# Patient Record
Sex: Male | Born: 1960
Health system: Southern US, Community
[De-identification: ages and names within clinical notes are randomized; demographics above are authoritative.]

## PROBLEM LIST (undated history)

## (undated) DIAGNOSIS — E785 Hyperlipidemia, unspecified: Secondary | ICD-10-CM

## (undated) DIAGNOSIS — R310 Gross hematuria: Secondary | ICD-10-CM

## (undated) DIAGNOSIS — Z85828 Personal history of other malignant neoplasm of skin: Secondary | ICD-10-CM

## (undated) DIAGNOSIS — F419 Anxiety disorder, unspecified: Secondary | ICD-10-CM

## (undated) DIAGNOSIS — F329 Major depressive disorder, single episode, unspecified: Secondary | ICD-10-CM

## (undated) DIAGNOSIS — T7840XA Allergy, unspecified, initial encounter: Secondary | ICD-10-CM

## (undated) DIAGNOSIS — K219 Gastro-esophageal reflux disease without esophagitis: Secondary | ICD-10-CM

## (undated) DIAGNOSIS — K449 Diaphragmatic hernia without obstruction or gangrene: Secondary | ICD-10-CM

## (undated) DIAGNOSIS — Z87442 Personal history of urinary calculi: Secondary | ICD-10-CM

## (undated) DIAGNOSIS — M1990 Unspecified osteoarthritis, unspecified site: Secondary | ICD-10-CM

## (undated) DIAGNOSIS — Z973 Presence of spectacles and contact lenses: Secondary | ICD-10-CM

## (undated) DIAGNOSIS — R0789 Other chest pain: Secondary | ICD-10-CM

## (undated) DIAGNOSIS — N369 Urethral disorder, unspecified: Secondary | ICD-10-CM

## (undated) DIAGNOSIS — Z87898 Personal history of other specified conditions: Secondary | ICD-10-CM

## (undated) DIAGNOSIS — R351 Nocturia: Secondary | ICD-10-CM

## (undated) DIAGNOSIS — A6000 Herpesviral infection of urogenital system, unspecified: Secondary | ICD-10-CM

## (undated) DIAGNOSIS — I10 Essential (primary) hypertension: Secondary | ICD-10-CM

## (undated) DIAGNOSIS — G4733 Obstructive sleep apnea (adult) (pediatric): Secondary | ICD-10-CM

## (undated) DIAGNOSIS — J302 Other seasonal allergic rhinitis: Secondary | ICD-10-CM

## (undated) DIAGNOSIS — F32A Depression, unspecified: Secondary | ICD-10-CM

## (undated) HISTORY — DX: Major depressive disorder, single episode, unspecified: F32.9

## (undated) HISTORY — PX: VASECTOMY: SHX75

## (undated) HISTORY — PX: POSTERIOR LUMBAR FUSION: SHX6036

## (undated) HISTORY — DX: Other chest pain: R07.89

## (undated) HISTORY — DX: Obstructive sleep apnea (adult) (pediatric): G47.33

## (undated) HISTORY — PX: SPINE SURGERY: SHX786

## (undated) HISTORY — PX: BACK SURGERY: SHX140

## (undated) HISTORY — DX: Personal history of urinary calculi: Z87.442

## (undated) HISTORY — DX: Allergy, unspecified, initial encounter: T78.40XA

## (undated) HISTORY — PX: KNEE SURGERY: SHX244

## (undated) HISTORY — PX: COLONOSCOPY: SHX174

## (undated) HISTORY — DX: Depression, unspecified: F32.A

## (undated) HISTORY — PX: JOINT REPLACEMENT: SHX530

## (undated) HISTORY — DX: Hyperlipidemia, unspecified: E78.5

---

## 1997-06-20 ENCOUNTER — Emergency Department (HOSPITAL_COMMUNITY): Admission: EM | Admit: 1997-06-20 | Discharge: 1997-06-20 | Payer: Self-pay | Admitting: Emergency Medicine

## 1999-05-06 ENCOUNTER — Ambulatory Visit (HOSPITAL_BASED_OUTPATIENT_CLINIC_OR_DEPARTMENT_OTHER): Admission: RE | Admit: 1999-05-06 | Discharge: 1999-05-06 | Payer: Self-pay | Admitting: *Deleted

## 2000-06-10 ENCOUNTER — Ambulatory Visit (HOSPITAL_BASED_OUTPATIENT_CLINIC_OR_DEPARTMENT_OTHER): Admission: RE | Admit: 2000-06-10 | Discharge: 2000-06-10 | Payer: Self-pay | Admitting: *Deleted

## 2001-01-19 HISTORY — PX: ROTATOR CUFF REPAIR: SHX139

## 2005-01-19 HISTORY — PX: KNEE ARTHROSCOPY: SUR90

## 2005-01-19 HISTORY — PX: KNEE SURGERY: SHX244

## 2007-04-21 ENCOUNTER — Encounter: Payer: Self-pay | Admitting: Family Medicine

## 2007-11-21 ENCOUNTER — Encounter: Payer: Self-pay | Admitting: Family Medicine

## 2008-04-03 ENCOUNTER — Ambulatory Visit: Payer: Self-pay | Admitting: Family Medicine

## 2008-04-03 DIAGNOSIS — Z87442 Personal history of urinary calculi: Secondary | ICD-10-CM | POA: Insufficient documentation

## 2008-04-03 DIAGNOSIS — F329 Major depressive disorder, single episode, unspecified: Secondary | ICD-10-CM | POA: Insufficient documentation

## 2008-04-03 DIAGNOSIS — E785 Hyperlipidemia, unspecified: Secondary | ICD-10-CM | POA: Insufficient documentation

## 2008-04-03 DIAGNOSIS — G4700 Insomnia, unspecified: Secondary | ICD-10-CM | POA: Insufficient documentation

## 2008-04-03 DIAGNOSIS — J309 Allergic rhinitis, unspecified: Secondary | ICD-10-CM | POA: Insufficient documentation

## 2008-04-25 ENCOUNTER — Ambulatory Visit: Payer: Self-pay | Admitting: Family Medicine

## 2008-05-31 ENCOUNTER — Ambulatory Visit: Payer: Self-pay | Admitting: Family Medicine

## 2008-05-31 LAB — CONVERTED CEMR LAB
Glucose, Urine, Semiquant: NEGATIVE
Ketones, urine, test strip: NEGATIVE
Nitrite: NEGATIVE
Specific Gravity, Urine: 1.025
Urobilinogen, UA: 0.2
WBC Urine, dipstick: NEGATIVE
pH: 6.5

## 2008-06-01 LAB — CONVERTED CEMR LAB
ALT: 32 units/L (ref 0–53)
AST: 28 units/L (ref 0–37)
Albumin: 4.3 g/dL (ref 3.5–5.2)
Alkaline Phosphatase: 70 units/L (ref 39–117)
BUN: 14 mg/dL (ref 6–23)
Basophils Absolute: 0 10*3/uL (ref 0.0–0.1)
Basophils Relative: 0.5 % (ref 0.0–3.0)
Bilirubin, Direct: 0.1 mg/dL (ref 0.0–0.3)
CO2: 32 meq/L (ref 19–32)
Calcium: 9.5 mg/dL (ref 8.4–10.5)
Chloride: 106 meq/L (ref 96–112)
Cholesterol: 221 mg/dL — ABNORMAL HIGH (ref 0–200)
Creatinine, Ser: 0.9 mg/dL (ref 0.4–1.5)
Direct LDL: 158.7 mg/dL
Eosinophils Absolute: 0.1 10*3/uL (ref 0.0–0.7)
Eosinophils Relative: 2 % (ref 0.0–5.0)
GFR calc non Af Amer: 96.43 mL/min (ref >60)
Glucose, Bld: 99 mg/dL (ref 70–99)
HCT: 43.6 % (ref 39.0–52.0)
HDL: 46.3 mg/dL (ref >39.00)
Hemoglobin: 15.1 g/dL (ref 13.0–17.0)
Lymphocytes Relative: 31.1 % (ref 12.0–46.0)
Lymphs Abs: 1.4 10*3/uL (ref 0.7–4.0)
MCHC: 34.7 g/dL (ref 30.0–36.0)
MCV: 90.7 fL (ref 78.0–100.0)
Monocytes Absolute: 0.5 10*3/uL (ref 0.1–1.0)
Monocytes Relative: 10.9 % (ref 3.0–12.0)
Neutro Abs: 2.5 10*3/uL (ref 1.4–7.7)
Neutrophils Relative %: 55.5 % (ref 43.0–77.0)
Platelets: 145 10*3/uL — ABNORMAL LOW (ref 150.0–400.0)
Potassium: 4.2 meq/L (ref 3.5–5.1)
RBC: 4.81 M/uL (ref 4.22–5.81)
RDW: 12.8 % (ref 11.5–14.6)
Sodium: 142 meq/L (ref 135–145)
TSH: 1.93 microintl units/mL (ref 0.35–5.50)
Total Bilirubin: 2.1 mg/dL — ABNORMAL HIGH (ref 0.3–1.2)
Total CHOL/HDL Ratio: 5
Total Protein: 7.2 g/dL (ref 6.0–8.3)
Triglycerides: 91 mg/dL (ref 0.0–149.0)
VLDL: 18.2 mg/dL (ref 0.0–40.0)
WBC: 4.5 10*3/uL (ref 4.5–10.5)

## 2008-06-07 ENCOUNTER — Ambulatory Visit: Payer: Self-pay | Admitting: Family Medicine

## 2008-06-07 DIAGNOSIS — F528 Other sexual dysfunction not due to a substance or known physiological condition: Secondary | ICD-10-CM | POA: Insufficient documentation

## 2008-06-26 ENCOUNTER — Ambulatory Visit: Payer: Self-pay | Admitting: Family Medicine

## 2008-07-31 ENCOUNTER — Ambulatory Visit: Payer: Self-pay | Admitting: Family Medicine

## 2008-08-02 ENCOUNTER — Encounter: Admission: RE | Admit: 2008-08-02 | Discharge: 2008-08-02 | Payer: Self-pay | Admitting: Family Medicine

## 2009-04-09 ENCOUNTER — Ambulatory Visit: Payer: Self-pay | Admitting: Family Medicine

## 2009-05-08 ENCOUNTER — Encounter: Payer: Self-pay | Admitting: Family Medicine

## 2009-06-03 ENCOUNTER — Ambulatory Visit: Payer: Self-pay | Admitting: Family Medicine

## 2009-06-03 DIAGNOSIS — M545 Low back pain, unspecified: Secondary | ICD-10-CM | POA: Insufficient documentation

## 2009-06-21 ENCOUNTER — Ambulatory Visit: Payer: Self-pay | Admitting: Family Medicine

## 2009-08-12 ENCOUNTER — Ambulatory Visit: Payer: Self-pay | Admitting: Family Medicine

## 2009-08-12 LAB — CONVERTED CEMR LAB
Bilirubin Urine: NEGATIVE
Blood in Urine, dipstick: NEGATIVE
Glucose, Urine, Semiquant: NEGATIVE
Ketones, urine, test strip: NEGATIVE
Nitrite: NEGATIVE
Protein, U semiquant: NEGATIVE
Specific Gravity, Urine: >=1.03
Urobilinogen, UA: 0.2
WBC Urine, dipstick: NEGATIVE
pH: 5

## 2009-08-13 LAB — CONVERTED CEMR LAB
ALT: 36 units/L (ref 0–53)
AST: 23 units/L (ref 0–37)
Albumin: 4.7 g/dL (ref 3.5–5.2)
Alkaline Phosphatase: 63 units/L (ref 39–117)
BUN: 20 mg/dL (ref 6–23)
Basophils Absolute: 0.1 10*3/uL (ref 0.0–0.1)
Basophils Relative: 0.9 % (ref 0.0–3.0)
Bilirubin, Direct: 0.3 mg/dL (ref 0.0–0.3)
CO2: 25 meq/L (ref 19–32)
Calcium: 9.4 mg/dL (ref 8.4–10.5)
Chloride: 103 meq/L (ref 96–112)
Cholesterol: 268 mg/dL — ABNORMAL HIGH (ref 0–200)
Creatinine, Ser: 0.9 mg/dL (ref 0.4–1.5)
Direct LDL: 147.5 mg/dL
Eosinophils Absolute: 0.2 10*3/uL (ref 0.0–0.7)
Eosinophils Relative: 2.1 % (ref 0.0–5.0)
GFR calc non Af Amer: 93.53 mL/min (ref >60)
Glucose, Bld: 90 mg/dL (ref 70–99)
HCT: 46.7 % (ref 39.0–52.0)
HDL: 44.1 mg/dL (ref >39.00)
Hemoglobin: 16.3 g/dL (ref 13.0–17.0)
Lymphocytes Relative: 27 % (ref 12.0–46.0)
Lymphs Abs: 2.1 10*3/uL (ref 0.7–4.0)
MCHC: 34.8 g/dL (ref 30.0–36.0)
MCV: 93.2 fL (ref 78.0–100.0)
Monocytes Absolute: 0.7 10*3/uL (ref 0.1–1.0)
Monocytes Relative: 9.4 % (ref 3.0–12.0)
Neutro Abs: 4.7 10*3/uL (ref 1.4–7.7)
Neutrophils Relative %: 60.6 % (ref 43.0–77.0)
Platelets: 191 10*3/uL (ref 150.0–400.0)
Potassium: 4.2 meq/L (ref 3.5–5.1)
RBC: 5.01 M/uL (ref 4.22–5.81)
RDW: 13.1 % (ref 11.5–14.6)
Sodium: 138 meq/L (ref 135–145)
TSH: 1.59 microintl units/mL (ref 0.35–5.50)
Total Bilirubin: 1.9 mg/dL — ABNORMAL HIGH (ref 0.3–1.2)
Total CHOL/HDL Ratio: 6
Total Protein: 7.5 g/dL (ref 6.0–8.3)
Triglycerides: 470 mg/dL — ABNORMAL HIGH (ref 0.0–149.0)
VLDL: 94 mg/dL — ABNORMAL HIGH (ref 0.0–40.0)
WBC: 7.8 10*3/uL (ref 4.5–10.5)

## 2009-08-19 ENCOUNTER — Ambulatory Visit: Payer: Self-pay | Admitting: Family Medicine

## 2009-08-19 DIAGNOSIS — R079 Chest pain, unspecified: Secondary | ICD-10-CM | POA: Insufficient documentation

## 2009-08-22 ENCOUNTER — Telehealth (INDEPENDENT_AMBULATORY_CARE_PROVIDER_SITE_OTHER): Payer: Self-pay | Admitting: Radiology

## 2009-08-26 ENCOUNTER — Encounter (HOSPITAL_COMMUNITY): Admission: RE | Admit: 2009-08-26 | Discharge: 2009-10-08 | Payer: Self-pay | Admitting: Family Medicine

## 2009-08-26 ENCOUNTER — Encounter: Payer: Self-pay | Admitting: Internal Medicine

## 2009-08-26 ENCOUNTER — Ambulatory Visit: Payer: Self-pay | Admitting: Internal Medicine

## 2009-08-26 ENCOUNTER — Ambulatory Visit: Payer: Self-pay

## 2010-02-12 ENCOUNTER — Observation Stay (HOSPITAL_COMMUNITY)
Admission: EM | Admit: 2010-02-12 | Discharge: 2010-02-13 | Payer: Self-pay | Source: Home / Self Care | Admitting: Emergency Medicine

## 2010-02-12 LAB — POCT CARDIAC MARKERS
CKMB, poc: <1 ng/mL — ABNORMAL LOW (ref 1.0–8.0)
CKMB, poc: <1 ng/mL — ABNORMAL LOW (ref 1.0–8.0)
CKMB, poc: <1 ng/mL — ABNORMAL LOW (ref 1.0–8.0)
Myoglobin, poc: 61.5 ng/mL (ref 12–200)
Myoglobin, poc: 49 ng/mL (ref 12–200)
Myoglobin, poc: 42.3 ng/mL (ref 12–200)
Troponin i, poc: <0.05 ng/mL (ref 0.00–0.09)
Troponin i, poc: <0.05 ng/mL (ref 0.00–0.09)
Troponin i, poc: <0.05 ng/mL (ref 0.00–0.09)

## 2010-02-12 LAB — COMPREHENSIVE METABOLIC PANEL
ALT: 35 U/L (ref 0–53)
AST: 25 U/L (ref 0–37)
Albumin: 4.5 g/dL (ref 3.5–5.2)
Alkaline Phosphatase: 65 U/L (ref 39–117)
BUN: 13 mg/dL (ref 6–23)
CO2: 29 mEq/L (ref 19–32)
Calcium: 9.6 mg/dL (ref 8.4–10.5)
Chloride: 103 mEq/L (ref 96–112)
Creatinine, Ser: 1.03 mg/dL (ref 0.4–1.5)
GFR calc Af Amer: >60 mL/min (ref >60)
GFR calc non Af Amer: >60 mL/min (ref >60)
Glucose, Bld: 97 mg/dL (ref 70–99)
Potassium: 3.8 mEq/L (ref 3.5–5.1)
Sodium: 140 mEq/L (ref 135–145)
Total Bilirubin: 2.2 mg/dL — ABNORMAL HIGH (ref 0.3–1.2)
Total Protein: 7.5 g/dL (ref 6.0–8.3)

## 2010-02-12 LAB — RAPID URINE DRUG SCREEN, HOSP PERFORMED
Amphetamines: NOT DETECTED
Barbiturates: NOT DETECTED
Benzodiazepines: POSITIVE — AB
Cocaine: NOT DETECTED
Opiates: NOT DETECTED
Tetrahydrocannabinol: POSITIVE — AB

## 2010-02-12 LAB — CBC
HCT: 46.8 % (ref 39.0–52.0)
Hemoglobin: 16.5 g/dL (ref 13.0–17.0)
MCH: 30.2 pg (ref 26.0–34.0)
MCHC: 35.3 g/dL (ref 30.0–36.0)
MCV: 85.6 fL (ref 78.0–100.0)
Platelets: 189 10*3/uL (ref 150–400)
RBC: 5.47 MIL/uL (ref 4.22–5.81)
RDW: 12.2 % (ref 11.5–15.5)
WBC: 8 10*3/uL (ref 4.0–10.5)

## 2010-02-18 NOTE — Assessment & Plan Note (Signed)
Summary: Cardiology Nuclear Testing  Nuclear Med Background Indications for Stress Test: Evaluation for Ischemia     Symptoms: Chest Pain, Chest Pain with Exertion, Chest Pressure, Palpitations, SOB    Nuclear Pre-Procedure Cardiac Risk Factors: Family History - CAD, Lipids Caffeine/Decaff Intake: None NPO After: 9:00 PM Lungs: Clear IV 0.9% NS with Angio Cath: 18g     IV Site: (R) AC IV Started by: Stanton Kidney EMT-P Chest Size (in) 44     Height (in): 71 Weight (lb): 189 BMI: 26.46  Nuclear Med Study 1 or 2 day study:  1 day     Stress Test Type:  Stress Reading MD:  Dietrich Pates, MD     Referring MD:  S.Fry Resting Radionuclide:  Technetium 41m Tetrofosmin     Resting Radionuclide Dose:  11.0 mCi  Stress Radionuclide:  Technetium 20m Tetrofosmin     Stress Radionuclide Dose:  33.0 mCi   Stress Protocol Exercise Time (min):  12:01 min     Max HR:  164 bpm     Predicted Max HR:  173 bpm  Max Systolic BP: 163 mm Hg     Percent Max HR:  94.80 %     METS: 13.7 Rate Pressure Product:  23762    Stress Test Technologist:  Irean Hong RN     Nuclear Technologist:  Harlow Asa CNMT  Rest Procedure  Myocardial perfusion imaging was performed at rest 45 minutes following the intravenous administration of Myoview Technetium 40m Tetrofosmin.  Stress Procedure  The patient exercised for 12 minutes and 01 seconds.   The patient stopped due to DOE  and denied any chest pain.  There were no significant ST-T wave changes.  Myoview was injected at peak exercise and myocardial perfusion imaging was performed after a brief delay.  QPS Raw Data Images:  Normal; no motion artifact; normal heart/lung ratio. Stress Images:  There is normal uptake in all areas. Rest Images:  Normal homogeneous uptake in all areas of the myocardium. Subtraction (SDS):  No evidence of ischemia. Transient Ischemic Dilatation:  .99  (Normal <1.22)  Lung/Heart Ratio:  .33  (Normal <0.45)  Quantitative Gated  Spect Images QGS EDV:  106 ml QGS ESV:  44 ml QGS EF:  58 %   Overall Impression  Exercise Capacity: Excellent exercise capacity. BP Response: Normal blood pressure response. Clinical Symptoms: No chest pain ECG Impression: No significant ST segment change suggestive of ischemia. Overall Impression: Normal stress nuclear study.  Appended Document: Cardiology Nuclear Testing normal stress test  Appended Document: Cardiology Nuclear Testing called.

## 2010-02-18 NOTE — Assessment & Plan Note (Signed)
Summary: CPX//CCM rsc bmp/njr/pt rsc/cjr   Vital Signs:  Patient profile:   50 year old male Height:      71.5 inches Weight:      193 pounds BMI:     26.64 Temp:     98.6 degrees F oral Pulse rate:   88 / minute BP sitting:   130 / 90  (left arm) Cuff size:   regular  Vitals Entered By: Kathrynn Speed CMA (August 19, 2009 1:30 PM) CC: cpx with lab review, src   History of Present Illness: 50 yr old male for a cpx. He feels fine in general, but he does describe some mild chest pains that come form time to time. he decribes these as a pressure sensation, but not sharp or severe. They do not seem to be related to exertion. No sweats or nausea, but he gets mildly SOB at times with them. No heartburn symptoms. He is very concerned due to his family history of heart disease at young ages. He watches his diet and exercises regularly.   Current Medications (verified): 1)  Claritin-D 24 Hour 10-240 Mg Xr24h-Tab (Loratadine-Pseudoephedrine) .... Once Daily 2)  Viagra 100 Mg Tabs (Sildenafil Citrate) .... As Needed  Allergies (verified): No Known Drug Allergies  Past History:  Past Medical History: Reviewed history from 06/03/2009 and no changes required. Allergic rhinitis Depression Hyperlipidemia Nephrolithiasis, hx of UTI's normal sleep study 2009 Low back pain, herniated disc L4-5  Past Surgical History: right Patella surgeries x 5 times (83, 89, 90, 01, and 02) per Dr. Ophelia Charter Lt knee torn meniscus repair 2007 Rotator cuff repair, left 2003 ESI's  to lumbar spine 2011 per Dr. Alvester Morin  Family History: Reviewed history from 04/03/2008 and no changes required. Family History Depression Family History High cholesterol Family History Hypertension Family History Lung cancer Family History of Stroke M 1st degree relative <50 Father had MI at age 25, then died of CAD at age 78  Social History: Reviewed history from 04/03/2008 and no changes required. Single Never  Smoked Alcohol use-yes Drug use-no Regular exercise-yes  Review of Systems  The patient denies anorexia, fever, weight loss, weight gain, vision loss, decreased hearing, hoarseness, syncope, dyspnea on exertion, peripheral edema, prolonged cough, headaches, hemoptysis, abdominal pain, melena, hematochezia, severe indigestion/heartburn, hematuria, incontinence, genital sores, muscle weakness, suspicious skin lesions, transient blindness, difficulty walking, depression, unusual weight change, abnormal bleeding, enlarged lymph nodes, angioedema, breast masses, and testicular masses.    Physical Exam  General:  Well-developed,well-nourished,in no acute distress; alert,appropriate and cooperative throughout examination Head:  Normocephalic and atraumatic without obvious abnormalities. No apparent alopecia or balding. Eyes:  No corneal or conjunctival inflammation noted. EOMI. Perrla. Funduscopic exam benign, without hemorrhages, exudates or papilledema. Vision grossly normal. Ears:  External ear exam shows no significant lesions or deformities.  Otoscopic examination reveals clear canals, tympanic membranes are intact bilaterally without bulging, retraction, inflammation or discharge. Hearing is grossly normal bilaterally. Nose:  External nasal examination shows no deformity or inflammation. Nasal mucosa are pink and moist without lesions or exudates. Mouth:  Oral mucosa and oropharynx without lesions or exudates.  Teeth in good repair. Neck:  No deformities, masses, or tenderness noted. Chest Wall:  No deformities, masses, tenderness or gynecomastia noted. Lungs:  Normal respiratory effort, chest expands symmetrically. Lungs are clear to auscultation, no crackles or wheezes. Heart:  Normal rate and regular rhythm. S1 and S2 normal without gallop, murmur, click, rub or other extra sounds. EKG normal Abdomen:  Bowel sounds positive,abdomen  soft and non-tender without masses, organomegaly or hernias  noted. Genitalia:  Testes bilaterally descended without nodularity, tenderness or masses. No scrotal masses or lesions. No penis lesions or urethral discharge. Msk:  No deformity or scoliosis noted of thoracic or lumbar spine.   Pulses:  R and L carotid,radial,femoral,dorsalis pedis and posterior tibial pulses are full and equal bilaterally Extremities:  No clubbing, cyanosis, edema, or deformity noted with normal full range of motion of all joints.   Neurologic:  No cranial nerve deficits noted. Station and gait are normal. Plantar reflexes are down-going bilaterally. DTRs are symmetrical throughout. Sensory, motor and coordinative functions appear intact. Skin:  Intact without suspicious lesions or rashes Cervical Nodes:  No lymphadenopathy noted Axillary Nodes:  No palpable lymphadenopathy Inguinal Nodes:  No significant adenopathy Psych:  Cognition and judgment appear intact. Alert and cooperative with normal attention span and concentration. No apparent delusions, illusions, hallucinations   Impression & Recommendations:  Problem # 1:  WELL ADULT EXAM (ICD-V70.0)  Complete Medication List: 1)  Claritin-d 24 Hour 10-240 Mg Xr24h-tab (Loratadine-pseudoephedrine) .... Once daily 2)  Viagra 100 Mg Tabs (Sildenafil citrate) .... As needed 3)  Zocor 40 Mg Tabs (Simvastatin) .... Once daily 4)  Aspirin 81 Mg Tbec (Aspirin) .... Once daily  Other Orders: EKG w/ Interpretation (93000) Cardiology Referral (Cardiology)  Patient Instructions: 1)  Start on Zocor and aspirin. watch the diet. Set up a stress test soon.  2)  Please schedule a follow-up appointment in 3 months .  Prescriptions: ZOCOR 40 MG TABS (SIMVASTATIN) once daily  #30 x 11   Entered and Authorized by:   Nelwyn Salisbury MD   Signed by:   Nelwyn Salisbury MD on 08/19/2009   Method used:   Electronically to        [id number] (retail)       [address]       [place], [place]  [address]       Ph: [phone] or  [phone]       Fax: [phone]   RxID:   [id number]

## 2010-02-18 NOTE — Progress Notes (Signed)
Summary: Nuc Pre-Procedure  Phone Note Outgoing Call Call back at [facility] Phone [phone]   Call placed by: Leonia Corona, RT-N,  August 22, 2009 1:02 PM Call placed to: Patient Reason for Call: Confirm/change Appt Summary of Call: Reviewed information on Myoview Information Sheet (see scanned document for further details).  Spoke with patient. Initial call taken by: Leonia Corona, RT-N,  August 22, 2009 1:02 PM     Nuclear Med Background Indications for Stress Test: Evaluation for Ischemia     Symptoms: Chest Pain, Chest Pressure, SOB    Nuclear Pre-Procedure Cardiac Risk Factors: Family History - CAD, Lipids Height (in): 71.5

## 2010-02-18 NOTE — Assessment & Plan Note (Signed)
Summary: NEEDS ALLERGY SHOT//CCM   Vital Signs:  Patient profile:   50 year old male Weight:      199 pounds BMI:     27.47 BP sitting:   142 / 100  (left arm) Cuff size:   regular  Vitals Entered By: Raechel Ache, RN (Jun 03, 2009 9:54 AM) CC: Wants injection for allergies.   History of Present Illness: Here for another allergy flare with itchy eyes, PND, sinus pressure, and ST. No fever or cough. He had a Depomedrol shot here on 04-09-09, and this was effective for a time.   Allergies: No Known Drug Allergies  Past History:  Past Medical History: Allergic rhinitis Depression Hyperlipidemia Nephrolithiasis, hx of UTI's normal sleep study 2009 Low back pain, herniated disc L4-5  Past Surgical History: right Patella surgeries x 5 times (83, 89, 90, 01, and 02) per Dr. Ophelia Charter Lt knee torn meniscus repair 2007 Rotator cuff repair, left 2003 ESI to lumbar spine 2011 per Dr. Ophelia Charter  Review of Systems  The patient denies anorexia, fever, weight loss, weight gain, vision loss, decreased hearing, hoarseness, chest pain, syncope, dyspnea on exertion, peripheral edema, prolonged cough, hemoptysis, abdominal pain, melena, hematochezia, severe indigestion/heartburn, hematuria, incontinence, genital sores, muscle weakness, suspicious skin lesions, transient blindness, difficulty walking, depression, unusual weight change, abnormal bleeding, enlarged lymph nodes, angioedema, breast masses, and testicular masses.    Physical Exam  General:  Well-developed,well-nourished,in no acute distress; alert,appropriate and cooperative throughout examination Head:  Normocephalic and atraumatic without obvious abnormalities. No apparent alopecia or balding. Eyes:  No corneal or conjunctival inflammation noted. EOMI. Perrla. Funduscopic exam benign, without hemorrhages, exudates or papilledema. Vision grossly normal. Ears:  External ear exam shows no significant lesions or deformities.  Otoscopic  examination reveals clear canals, tympanic membranes are intact bilaterally without bulging, retraction, inflammation or discharge. Hearing is grossly normal bilaterally. Nose:  External nasal examination shows no deformity or inflammation. Nasal mucosa are pink and moist without lesions or exudates. Mouth:  Oral mucosa and oropharynx without lesions or exudates.  Teeth in good repair. Neck:  No deformities, masses, or tenderness noted. Lungs:  Normal respiratory effort, chest expands symmetrically. Lungs are clear to auscultation, no crackles or wheezes.   Impression & Recommendations:  Problem # 1:  ALLERGIC RHINITIS (ICD-477.9)  Orders: Depo- Medrol 80mg  (J1040) Admin of Therapeutic Inj  intramuscular or subcutaneous ([address])  Complete Medication List: 1)  Claritin-d 24 Hour 10-240 Mg Xr24h-tab (Loratadine-pseudoephedrine) .... Once daily 2)  Viagra 100 Mg Tabs (Sildenafil citrate) .... As needed  Patient Instructions: 1)  Please schedule a follow-up appointment as needed .  Prescriptions: VIAGRA 100 MG TABS (SILDENAFIL CITRATE) as needed  #10 x 11   Entered and Authorized by:   Nelwyn Salisbury MD   Signed by:   Nelwyn Salisbury MD on 06/03/2009   Method used:   Electronically to        [id number] (retail)       [address]       [place], [place]  [address]       Ph: [phone] or [phone]       Fax: [phone]   RxID:   [id number]    Medication Administration  Injection # 1:    Medication: Depo- Medrol 80mg     Diagnosis: ALLERGIC RHINITIS (ICD-477.9)    Route: IM    Site: RUOQ gluteus    Exp Date: 11/2011    Lot #: [id number]    Mfr:  Pharmacia    Comments: 160 mg given    Patient tolerated injection without complications    Given by: Raechel Ache, RN (Jun 03, 2009 10:36 AM)  Orders Added: 1)  Est. Patient Level IV [[address]] 2)  Depo- Medrol 80mg  [J1040] 3)  Admin of Therapeutic Inj  intramuscular or subcutaneous [[address]]

## 2010-02-18 NOTE — Letter (Signed)
Summary: Records from Dr. Blair Heys Office 2007 - 2009  Records from Dr. Blair Heys Office 2007 - 2009   Imported By: Maryln Gottron 04/30/2008 12:19:27  _____________________________________________________________________  External Attachment:    Type:   Image     Comment:   External Document

## 2010-02-18 NOTE — Letter (Signed)
Summary: [facility]   Imported By: Maryln Gottron 05/16/2009 12:59:16  _____________________________________________________________________  External Attachment:    Type:   Image     Comment:   External Document

## 2010-02-18 NOTE — Assessment & Plan Note (Signed)
Summary: wants allergy shot//ccm   Vital Signs:  Patient profile:   50 year old male Weight:      198 pounds O2 Sat:      96 % Temp:     98.2 degrees F Pulse rate:   94 / minute BP sitting:   120 / 86  (left arm)  Vitals Entered By: Pura Spice, RN (April 25, 2008 9:59 AM) CC: allergie flare up    History of Present Illness: Here for his typical springtime allergy flare. Gets stuffy  head, sneezing, itchy eyes, and runny nose. No fever or cough. On Claritin D. Gets a steroid shot every spring.   Allergies (verified): No Known Drug Allergies  Past History:  Past Medical History:    Reviewed history from 04/03/2008 and no changes required:    Allergic rhinitis    Depression    Hyperlipidemia    Nephrolithiasis, hx of    UTI's    normal sleep study 2009  Review of Systems  The patient denies anorexia, fever, weight loss, weight gain, vision loss, decreased hearing, hoarseness, chest pain, syncope, dyspnea on exertion, peripheral edema, prolonged cough, headaches, hemoptysis, abdominal pain, melena, hematochezia, severe indigestion/heartburn, hematuria, incontinence, genital sores, muscle weakness, suspicious skin lesions, transient blindness, difficulty walking, depression, unusual weight change, abnormal bleeding, enlarged lymph nodes, angioedema, breast masses, and testicular masses.    Physical Exam  General:  Well-developed,well-nourished,in no acute distress; alert,appropriate and cooperative throughout examination Head:  Normocephalic and atraumatic without obvious abnormalities. No apparent alopecia or balding. Eyes:  No corneal or conjunctival inflammation noted. EOMI. Perrla. Funduscopic exam benign, without hemorrhages, exudates or papilledema. Vision grossly normal. Ears:  External ear exam shows no significant lesions or deformities.  Otoscopic examination reveals clear canals, tympanic membranes are intact bilaterally without bulging, retraction, inflammation or  discharge. Hearing is grossly normal bilaterally. Nose:  External nasal examination shows no deformity or inflammation. Nasal mucosa are pink and moist without lesions or exudates. Mouth:  Oral mucosa and oropharynx without lesions or exudates.  Teeth in good repair. Neck:  No deformities, masses, or tenderness noted. Lungs:  Normal respiratory effort, chest expands symmetrically. Lungs are clear to auscultation, no crackles or wheezes.   Impression & Recommendations:  Problem # 1:  ALLERGIC RHINITIS (ICD-477.9)  Orders: Depo- Medrol 80mg  (J1040) Admin of Therapeutic Inj  intramuscular or subcutaneous ([address])  Complete Medication List: 1)  Claritin-d 24 Hour 10-240 Mg Xr24h-tab (Loratadine-pseudoephedrine) .... Once daily  Patient Instructions: 1)  Please schedule a follow-up appointment as needed .    Medication Administration  Injection # 1:    Medication: Depo- Medrol 80mg     Diagnosis: ALLERGIC RHINITIS (ICD-477.9)    Route: IM    Site: LUOQ gluteus    Exp Date: 3/11    Lot #: [id number]    Mfr: Teva    Comments: 120mg     Patient tolerated injection without complications    Given by: Alfred Levins, CMA (April 25, 2008 10:34 AM)  Orders Added: 1)  Est. Patient Level IV [[address]] 2)  Depo- Medrol 80mg  [J1040] 3)  Admin of Therapeutic Inj  intramuscular or subcutaneous [[address]]

## 2010-02-18 NOTE — Assessment & Plan Note (Signed)
Summary: diarrhea ,vomiting/dm   Vital Signs:  Patient profile:   50 year old male Weight:      191 pounds O2 Sat:      96 % Temp:     98.3 degrees F Pulse rate:   95 / minute BP sitting:   110 / 80  (left arm) Cuff size:   large  Vitals Entered By: Pura Spice, RN (July 31, 2008 3:04 PM) CC: vomiting diarrhea abd cramps ate alot "rich foods last week "   History of Present Illness: Pt seen with complaint nausea, vomiting and diarrhea which is now subsiding, has been on clear liquid diet and clear  soup plkus Imodium .onset in AM 1 day ago after having  Pizza night before plus pt stated had been eating richer foods in past few days thyan he usually eATS. Has had these symptoms  several times in past yeatr with pain on rt abdomen and even at times without diarrhea, vomiting, pt concerned about possible cholelethiasis no other complaints   Allergies (verified): No Known Drug Allergies  Review of Systems      See HPI General:  Complains of malaise and weakness. ENT:  Denies decreased hearing, difficulty swallowing, ear discharge, earache, hoarseness, nasal congestion, nosebleeds, postnasal drainage, ringing in ears, sinus pressure, and sore throat. CV:  Denies bluish discoloration of lips or nails, chest pain or discomfort, difficulty breathing at night, difficulty breathing while lying down, fainting, fatigue, leg cramps with exertion, lightheadness, near fainting, palpitations, shortness of breath with exertion, swelling of feet, swelling of hands, and weight gain. Resp:  Denies chest discomfort, chest pain with inspiration, cough, coughing up blood, excessive snoring, hypersomnolence, morning headaches, pleuritic, shortness of breath, sputum productive, and wheezing. GI:  Complains of abdominal pain, diarrhea, and vomiting. GU:  Denies decreased libido, discharge, dysuria, erectile dysfunction, genital sores, hematuria, incontinence, nocturia, urinary frequency, and urinary  hesitancy. MS:  Denies joint pain, joint redness, joint swelling, loss of strength, low back pain, mid back pain, muscle aches, muscle , cramps, muscle weakness, stiffness, and thoracic pain.  Physical Exam  General:  Well-developed,well-nourished,in no acute distress; alert,appropriate and cooperative throughout examination Head:  Normocephalic and atraumatic without obvious abnormalities. No apparent alopecia or balding. Eyes:  No corneal or conjunctival inflammation noted. EOMI. Perrla. Funduscopic exam benign, without hemorrhages, exudates or papilledema. Vision grossly normal. Ears:  External ear exam shows no significant lesions or deformities.  Otoscopic examination reveals clear canals, tympanic membranes are intact bilaterally without bulging, retraction, inflammation or discharge. Hearing is grossly normal bilaterally. Nose:  External nasal examination shows no deformity or inflammation. Nasal mucosa are pink and moist without lesions or exudates. Mouth:  Oral mucosa and oropharynx without lesions or exudates.  Teeth in good repair. Lungs:  Normal respiratory effort, chest expands symmetrically. Lungs are clear to auscultation, no crackles or wheezes. Heart:  Normal rate and regular rhythm. S1 and S2 normal without gallop, murmur, click, rub or other extra sounds. Abdomen:  no distention, no masses, no guarding, no rigidity, no rebound tenderness, no abdominal hernia, no hepatomegaly, no splenomegaly, and bowel sounds hyperactive.   Rectal:  NE Genitalia:  NE   Impression & Recommendations:  Problem # 1:  GASTROENTERITIS (ICD-558.9) Assessment New continue Imodium for diarrhea, graduall increase diet  Problem # 2:  CHOLELITHIASIS (ICD-574.20) Assessment: Comment Only  Orders: Radiology Referral (Radiology)  Problem # 3:  DEPRESSION (ICD-311) Assessment: Improved  Complete Medication List: 1)  Claritin-d 24 Hour 10-240 Mg Xr24h-tab (Loratadine-pseudoephedrine) .Marland KitchenMarland KitchenMarland Kitchen  Once  daily 2)  Viagra 100 Mg Tabs (Sildenafil citrate) .... As needed  Patient Instructions: 1)  since having rt sided pain so oten and relate to gastroenteitis, will get ultrasound GB 2)  will notify you  about GB

## 2010-02-18 NOTE — Assessment & Plan Note (Signed)
Summary: pt req allergy shot/cjr   Vital Signs:  Patient profile:   50 year old male Weight:      207 pounds BMI:     28.57 Temp:     98.4 degrees F oral Pulse rate:   84 / minute BP sitting:   148 / 100  (left arm) Cuff size:   regular  Vitals Entered By: Raechel Ache, RN (April 09, 2009 1:24 PM) CC: C/o allergies and wants shot- sneezing, itchy eyes, runny nose.   History of Present Illness: About 2 weeks ago his typical springtime allergies started again. He has had itchy eyes, sneezing, runny nose, and stuffy head. Npo fever or cough. Using Claritin D.   Allergies: No Known Drug Allergies  Past History:  Past Medical History: Reviewed history from 04/03/2008 and no changes required. Allergic rhinitis Depression Hyperlipidemia Nephrolithiasis, hx of UTI's normal sleep study 2009  Review of Systems  The patient denies anorexia, fever, weight loss, weight gain, vision loss, decreased hearing, hoarseness, chest pain, syncope, dyspnea on exertion, peripheral edema, prolonged cough, hemoptysis, abdominal pain, melena, hematochezia, severe indigestion/heartburn, hematuria, incontinence, genital sores, muscle weakness, suspicious skin lesions, transient blindness, difficulty walking, depression, unusual weight change, abnormal bleeding, enlarged lymph nodes, angioedema, breast masses, and testicular masses.    Physical Exam  General:  Well-developed,well-nourished,in no acute distress; alert,appropriate and cooperative throughout examination Head:  Normocephalic and atraumatic without obvious abnormalities. No apparent alopecia or balding. Eyes:  No corneal or conjunctival inflammation noted. EOMI. Perrla. Funduscopic exam benign, without hemorrhages, exudates or papilledema. Vision grossly normal. Ears:  External ear exam shows no significant lesions or deformities.  Otoscopic examination reveals clear canals, tympanic membranes are intact bilaterally without bulging,  retraction, inflammation or discharge. Hearing is grossly normal bilaterally. Nose:  External nasal examination shows no deformity or inflammation. Nasal mucosa are pink and moist without lesions or exudates. Mouth:  Oral mucosa and oropharynx without lesions or exudates.  Teeth in good repair. Neck:  No deformities, masses, or tenderness noted. Lungs:  Normal respiratory effort, chest expands symmetrically. Lungs are clear to auscultation, no crackles or wheezes.   Impression & Recommendations:  Problem # 1:  ALLERGIC RHINITIS (ICD-477.9)  Orders: Depo- Medrol 80mg  (J1040) Admin of Therapeutic Inj  intramuscular or subcutaneous (62952)  Complete Medication List: 1)  Claritin-d 24 Hour 10-240 Mg Xr24h-tab (Loratadine-pseudoephedrine) .... Once daily 2)  Viagra 100 Mg Tabs (Sildenafil citrate) .... As needed  Patient Instructions: 1)  Please schedule a follow-up appointment as needed .    Medication Administration  Injection # 1:    Medication: Depo- Medrol 80mg     Diagnosis: ALLERGIC RHINITIS (ICD-477.9)    Route: IM    Site: L deltoid    Exp Date: 11/2011    Lot #: [id number]    Mfr: Pharmacia    Comments: 120 mg given    Patient tolerated injection without complications    Given by: Raechel Ache, RN (April 09, 2009 2:00 PM)  Orders Added: 1)  Est. Patient Level III [84132] 2)  Depo- Medrol 80mg  [J1040] 3)  Admin of Therapeutic Inj  intramuscular or subcutaneous [44010]

## 2010-02-18 NOTE — Assessment & Plan Note (Signed)
Summary: to be est/ok per doc/njr   Vital Signs:  Patient profile:   50 year old male Height:      71.5 inches Weight:      202 pounds BMI:     27.88 Temp:     98.2 degrees F oral Pulse rate:   91 / minute Pulse rhythm:   regular BP sitting:   112 / 80  (left arm) Cuff size:   large  Vitals Entered By: Alfred Levins, CMA (April 03, 2008 10:53 AM) CC: establish   History of Present Illness: 50 yr old male to establish with [place] after transfering from the practice of Dr. Bradd Canary. He has several questions about sleep apnea, fatigue, and weight. He realizes that he is about 10-15 lbs overweight and that this can make him feel sluggish. He does snore a lot, and his girlfriend has commented on how he stops breathing at times during the night. He had a sleep  study last year that was reportedly normal. His last cpx was several years ago.   Preventive Screening-Counseling & Management     Smoking Status: never     Does Patient Exercise: yes      Drug Use:  no.    Allergies (verified): No Known Drug Allergies  Past History:  Past Medical History:    Allergic rhinitis    Depression    Hyperlipidemia    Nephrolithiasis, hx of    UTI's    normal sleep study 2009  Past Surgical History:    right Patella surgeries x 5 times (83, 89, 90, 01, and 02) per Dr. Ophelia Charter    Lt knee torn meniscus repair 2007    Rotator cuff repair, left 2003  Family History:    Reviewed history and no changes required:       Family History Depression       Family History High cholesterol       Family History Hypertension       Family History Lung cancer       Family History of Stroke M 1st degree relative <50       Father had MI at age 57, then died of CAD at age 29         Social History:    Reviewed history and no changes required:       Single       Never Smoked       Alcohol use-yes       Drug use-no       Regular exercise-yes    Smoking Status:  never    Drug Use:  no    Does Patient  Exercise:  yes  Review of Systems  The patient denies anorexia, fever, weight loss, weight gain, vision loss, decreased hearing, hoarseness, chest pain, syncope, dyspnea on exertion, peripheral edema, prolonged cough, headaches, hemoptysis, abdominal pain, melena, hematochezia, severe indigestion/heartburn, hematuria, incontinence, genital sores, muscle weakness, suspicious skin lesions, transient blindness, difficulty walking, depression, unusual weight change, abnormal bleeding, enlarged lymph nodes, angioedema, breast masses, and testicular masses.    Physical Exam  General:  Well-developed,well-nourished,in no acute distress; alert,appropriate and cooperative throughout examination Neck:  No deformities, masses, or tenderness noted. Lungs:  Normal respiratory effort, chest expands symmetrically. Lungs are clear to auscultation, no crackles or wheezes. Heart:  Normal rate and regular rhythm. S1 and S2 normal without gallop, murmur, click, rub or other extra sounds.   Impression & Recommendations:  Problem # 1:  INSOMNIA (ICD-780.52)  Problem # 2:  FATIGUE (ICD-780.79)  Problem # 3:  ALLERGIC RHINITIS (ICD-477.9)  Problem # 4:  NEPHROLITHIASIS, HX OF (ICD-V13.01)  Problem # 5:  HYPERLIPIDEMIA (ICD-272.4)  Problem # 6:  DEPRESSION (ICD-311)  Complete Medication List: 1)  Claritin-d 24 Hour 10-240 Mg Xr24h-tab (Loratadine-pseudoephedrine) .... Once daily  Patient Instructions: 1)  It is important that you exercise reguarly at least 20 minutes 5 times a week. If you develop chest pain, have severe difficulty breathing, or feel very tired, stop exercising immediately and seek medical attention.  2)  You need to lose weight. Consider a lower calorie diet and regular exercise.  3)  Set up cpx with labs soon.

## 2010-02-18 NOTE — Letter (Signed)
Summary: Records from Dr. [facility] Office 2004 - 2009  Records from Dr. [facility] 2004 - 2009   Imported By: Maryln Gottron 04/30/2008 12:40:41  _____________________________________________________________________  External Attachment:    Type:   Image     Comment:   External Document

## 2010-02-18 NOTE — Assessment & Plan Note (Signed)
Summary: allergy attack/mhf   Vital Signs:  Patient profile:   50 year old male Weight:      195 pounds Temp:     97.9 degrees F oral BP sitting:   148 / 100  (left arm) Cuff size:   regular  Vitals Entered By: Alfred Levins, CMA (June 26, 2008 3:30 PM) CC: allergies   History of Present Illness: Here with another flare up of his allergies. he is having sneezing, burning in the sinuses, PND, and HA. No fever or cough.   Allergies: No Known Drug Allergies  Past History:  Past Medical History: Reviewed history from 04/03/2008 and no changes required. Allergic rhinitis Depression Hyperlipidemia Nephrolithiasis, hx of UTI's normal sleep study 2009  Review of Systems  The patient denies anorexia, fever, weight loss, weight gain, vision loss, decreased hearing, hoarseness, chest pain, syncope, dyspnea on exertion, peripheral edema, prolonged cough, hemoptysis, abdominal pain, melena, hematochezia, severe indigestion/heartburn, hematuria, incontinence, genital sores, muscle weakness, suspicious skin lesions, transient blindness, difficulty walking, depression, unusual weight change, abnormal bleeding, enlarged lymph nodes, angioedema, breast masses, and testicular masses.    Physical Exam  General:  Well-developed,well-nourished,in no acute distress; alert,appropriate and cooperative throughout examination Head:  Normocephalic and atraumatic without obvious abnormalities. No apparent alopecia or balding. Eyes:  No corneal or conjunctival inflammation noted. EOMI. Perrla. Funduscopic exam benign, without hemorrhages, exudates or papilledema. Vision grossly normal. Ears:  External ear exam shows no significant lesions or deformities.  Otoscopic examination reveals clear canals, tympanic membranes are intact bilaterally without bulging, retraction, inflammation or discharge. Hearing is grossly normal bilaterally. Nose:  External nasal examination shows no deformity or inflammation.  Nasal mucosa are pink and moist without lesions or exudates. Mouth:  Oral mucosa and oropharynx without lesions or exudates.  Teeth in good repair. Neck:  No deformities, masses, or tenderness noted. Lungs:  Normal respiratory effort, chest expands symmetrically. Lungs are clear to auscultation, no crackles or wheezes.   Impression & Recommendations:  Problem # 1:  ALLERGIC RHINITIS (ICD-477.9)  Orders: Depo- Medrol 40mg  (J1030) Admin of Therapeutic Inj  intramuscular or subcutaneous (84166)  Complete Medication List: 1)  Claritin-d 24 Hour 10-240 Mg Xr24h-tab (Loratadine-pseudoephedrine) .... Once daily 2)  Viagra 100 Mg Tabs (Sildenafil citrate) .... As needed  Patient Instructions: 1)  Please schedule a follow-up appointment as needed .    Medication Administration  Injection # 1:    Medication: Depo- Medrol 40mg     Diagnosis: ALLERGIC RHINITIS (ICD-477.9)    Route: IM    Site: LUOQ gluteus    Exp Date: 12/20/2010    Lot #: [id number]    Mfr: Pharmacia    Comments: 160mg     Patient tolerated injection without complications    Given by: Alfred Levins, CMA (June 26, 2008 5:07 PM)  Orders Added: 1)  Est. Patient Level IV [60109] 2)  Depo- Medrol 40mg  [J1030] 3)  Admin of Therapeutic Inj  intramuscular or subcutaneous [32355]

## 2010-02-18 NOTE — Assessment & Plan Note (Signed)
Summary: CPX/NJR   Vital Signs:  Patient profile:   50 year old male Height:      71.5 inches Weight:      192 pounds Temp:     98.1 degrees F oral Pulse rate:   77 / minute BP sitting:   124 / 82  (left arm) Cuff size:   large  Vitals Entered By: Alfred Levins, CMA (Jun 07, 2008 9:14 AM) CC: cpx   History of Present Illness: 50 yr old male for cpx. Doing well. His allergies are under much better control. He has some erection difficulties at times. Has used Viagra in the past and wants to try it again.   Allergies (verified): No Known Drug Allergies  Past History:  Past Medical History:    Reviewed history from 04/03/2008 and no changes required:    Allergic rhinitis    Depression    Hyperlipidemia    Nephrolithiasis, hx of    UTI's    normal sleep study 2009  Past Surgical History:    Reviewed history from 04/03/2008 and no changes required:    right Patella surgeries x 5 times (83, 89, 90, 01, and 02) per Dr. Ophelia Charter    Lt knee torn meniscus repair 2007    Rotator cuff repair, left 2003  Family History:    Reviewed history from 04/03/2008 and no changes required:       Family History Depression       Family History High cholesterol       Family History Hypertension       Family History Lung cancer       Family History of Stroke M 1st degree relative <50       Father had MI at age 52, then died of CAD at age 1         Social History:    Reviewed history from 04/03/2008 and no changes required:       Single       Never Smoked       Alcohol use-yes       Drug use-no       Regular exercise-yes  Review of Systems  The patient denies anorexia, fever, weight loss, weight gain, vision loss, decreased hearing, hoarseness, chest pain, syncope, dyspnea on exertion, peripheral edema, prolonged cough, headaches, hemoptysis, abdominal pain, melena, hematochezia, severe indigestion/heartburn, hematuria, incontinence, genital sores, muscle weakness, suspicious skin  lesions, transient blindness, difficulty walking, depression, unusual weight change, abnormal bleeding, enlarged lymph nodes, angioedema, breast masses, and testicular masses.    Physical Exam  General:  Well-developed,well-nourished,in no acute distress; alert,appropriate and cooperative throughout examination Head:  Normocephalic and atraumatic without obvious abnormalities. No apparent alopecia or balding. Eyes:  No corneal or conjunctival inflammation noted. EOMI. Perrla. Funduscopic exam benign, without hemorrhages, exudates or papilledema. Vision grossly normal. Ears:  External ear exam shows no significant lesions or deformities.  Otoscopic examination reveals clear canals, tympanic membranes are intact bilaterally without bulging, retraction, inflammation or discharge. Hearing is grossly normal bilaterally. Nose:  External nasal examination shows no deformity or inflammation. Nasal mucosa are pink and moist without lesions or exudates. Mouth:  Oral mucosa and oropharynx without lesions or exudates.  Teeth in good repair. Neck:  No deformities, masses, or tenderness noted. Chest Wall:  No deformities, masses, tenderness or gynecomastia noted. Lungs:  Normal respiratory effort, chest expands symmetrically. Lungs are clear to auscultation, no crackles or wheezes. Heart:  Normal rate and regular rhythm. S1 and S2 normal without  gallop, murmur, click, rub or other extra sounds. Abdomen:  Bowel sounds positive,abdomen soft and non-tender without masses, organomegaly or hernias noted. Genitalia:  Testes bilaterally descended without nodularity, tenderness or masses. No scrotal masses or lesions. No penis lesions or urethral discharge. Msk:  No deformity or scoliosis noted of thoracic or lumbar spine.   Pulses:  R and L carotid,radial,femoral,dorsalis pedis and posterior tibial pulses are full and equal bilaterally Extremities:  No clubbing, cyanosis, edema, or deformity noted with normal full  range of motion of all joints.   Neurologic:  No cranial nerve deficits noted. Station and gait are normal. Plantar reflexes are down-going bilaterally. DTRs are symmetrical throughout. Sensory, motor and coordinative functions appear intact. Skin:  Intact without suspicious lesions or rashes Cervical Nodes:  No lymphadenopathy noted Axillary Nodes:  No palpable lymphadenopathy Inguinal Nodes:  No significant adenopathy Psych:  Cognition and judgment appear intact. Alert and cooperative with normal attention span and concentration. No apparent delusions, illusions, hallucinations   Impression & Recommendations:  Problem # 1:  WELL ADULT EXAM (ICD-V70.0)  Problem # 2:  ERECTILE DYSFUNCTION (ICD-302.72)  His updated medication list for this problem includes:    Viagra 100 Mg Tabs (Sildenafil citrate) .Marland Kitchen... As needed  Complete Medication List: 1)  Claritin-d 24 Hour 10-240 Mg Xr24h-tab (Loratadine-pseudoephedrine) .... Once daily 2)  Viagra 100 Mg Tabs (Sildenafil citrate) .... As needed  Patient Instructions: 1)  Recheck lipids in 6 months Prescriptions: VIAGRA 100 MG TABS (SILDENAFIL CITRATE) as needed  #10 x 11   Entered and Authorized by:   Nelwyn Salisbury MD   Signed by:   Nelwyn Salisbury MD on 06/07/2008   Method used:   Electronically to        [id number] (retail)       [address]       [place], [place]  [address]       Ph: [phone] or [phone]       Fax: [phone]   RxID:   [id number]

## 2010-02-18 NOTE — Assessment & Plan Note (Signed)
Summary: ?SINUS INF/JAW PAIN/NJR/pt rescd//ccm   Vital Signs:  Patient profile:   50 year old male Weight:      197 pounds BMI:     27.19 Temp:     98.2 degrees F oral BP sitting:   120 / 96  (left arm) Cuff size:   regular  Vitals Entered By: Raechel Ache, RN (June 21, 2009 2:49 PM) CC: C/o sinus cong, sensitive teeth and L jaw aches.   History of Present Illness: Here for 3 days of sinus pressure, PND, ST, and blowing green mucus from  the nose. No fever or cough.  Allergies (verified): No Known Drug Allergies  Past History:  Past Medical History: Reviewed history from 06/03/2009 and no changes required. Allergic rhinitis Depression Hyperlipidemia Nephrolithiasis, hx of UTI's normal sleep study 2009 Low back pain, herniated disc L4-5  Review of Systems  The patient denies anorexia, fever, weight loss, weight gain, vision loss, decreased hearing, hoarseness, chest pain, syncope, dyspnea on exertion, peripheral edema, prolonged cough, hemoptysis, abdominal pain, melena, hematochezia, severe indigestion/heartburn, hematuria, incontinence, genital sores, muscle weakness, suspicious skin lesions, transient blindness, difficulty walking, depression, unusual weight change, abnormal bleeding, enlarged lymph nodes, angioedema, breast masses, and testicular masses.    Physical Exam  General:  Well-developed,well-nourished,in no acute distress; alert,appropriate and cooperative throughout examination Head:  Normocephalic and atraumatic without obvious abnormalities. No apparent alopecia or balding. Eyes:  No corneal or conjunctival inflammation noted. EOMI. Perrla. Funduscopic exam benign, without hemorrhages, exudates or papilledema. Vision grossly normal. Ears:  External ear exam shows no significant lesions or deformities.  Otoscopic examination reveals clear canals, tympanic membranes are intact bilaterally without bulging, retraction, inflammation or discharge. Hearing is  grossly normal bilaterally. Nose:  External nasal examination shows no deformity or inflammation. Nasal mucosa are pink and moist without lesions or exudates. Mouth:  Oral mucosa and oropharynx without lesions or exudates.  Teeth in good repair. Neck:  No deformities, masses, or tenderness noted. Lungs:  Normal respiratory effort, chest expands symmetrically. Lungs are clear to auscultation, no crackles or wheezes.   Impression & Recommendations:  Problem # 1:  ACUTE SINUSITIS, UNSPECIFIED (ICD-461.9)  His updated medication list for this problem includes:    Claritin-d 24 Hour 10-240 Mg Xr24h-tab (Loratadine-pseudoephedrine) ..... Once daily    Augmentin 875-125 Mg Tabs (Amoxicillin-pot clavulanate) .Marland Kitchen..Marland Kitchen Two times a day  Complete Medication List: 1)  Claritin-d 24 Hour 10-240 Mg Xr24h-tab (Loratadine-pseudoephedrine) .... Once daily 2)  Viagra 100 Mg Tabs (Sildenafil citrate) .... As needed 3)  Augmentin 875-125 Mg Tabs (Amoxicillin-pot clavulanate) .... Two times a day  Patient Instructions: 1)  Please schedule a follow-up appointment as needed .  Prescriptions: AUGMENTIN 875-125 MG TABS (AMOXICILLIN-POT CLAVULANATE) two times a day  #20 x 0   Entered and Authorized by:   Nelwyn Salisbury MD   Signed by:   Nelwyn Salisbury MD on 06/21/2009   Method used:   Electronically to        [id number] (retail)       [address]       [place], [place]  [address]       Ph: [phone] or [phone]       Fax: [phone]   RxID:   [id number]

## 2010-02-26 ENCOUNTER — Encounter: Payer: Self-pay | Admitting: Family Medicine

## 2010-02-27 ENCOUNTER — Encounter: Payer: Self-pay | Admitting: Family Medicine

## 2010-02-27 ENCOUNTER — Ambulatory Visit (INDEPENDENT_AMBULATORY_CARE_PROVIDER_SITE_OTHER): Payer: 59 | Admitting: Family Medicine

## 2010-02-27 DIAGNOSIS — F411 Generalized anxiety disorder: Secondary | ICD-10-CM

## 2010-02-27 DIAGNOSIS — M940 Chondrocostal junction syndrome [Tietze]: Secondary | ICD-10-CM

## 2010-02-27 DIAGNOSIS — F419 Anxiety disorder, unspecified: Secondary | ICD-10-CM

## 2010-02-27 DIAGNOSIS — B351 Tinea unguium: Secondary | ICD-10-CM

## 2010-02-27 MED ORDER — LORAZEPAM 1 MG PO TABS: 1.0000 mg | ORAL_TABLET | Freq: Four times a day (QID) | ORAL | Status: AC | PRN

## 2010-02-27 MED ORDER — TERBINAFINE HCL 250 MG PO TABS: 250.0000 mg | ORAL_TABLET | Freq: Every day | ORAL | Status: AC

## 2010-02-27 NOTE — Progress Notes (Signed)
  Subjective:    Patient ID: Dale [name], male    DOB: 1960/03/12, 50 y.o.   MRN: [id number]  HPI Here to follow up after an ER stay from 02-12-10 to 02-13-10 for chest pains. His exam was normal, as were the EKG and CXR and labs. He even had a stress ECHO per Dr. Patty Sermons that was normal. This pain was sharp and located along the left sternal margin. It was not related to exertion. No associated SOB or nausea. This has totally resolved now. He does relate being under a lot of stress for several months since his mother has developed Alzheimers dementia. She has fallen several times., and he has placed her in a nursing home. He feels a lot of anxiety and has trouble sleeping at night. Finally, he has had toenail fungus for years and wants to teat this.    Review of Systems  Constitutional: Negative.   Respiratory: Negative.   Cardiovascular: Positive for chest pain. Negative for palpitations and leg swelling.  Gastrointestinal: Negative.   Psychiatric/Behavioral: Positive for sleep disturbance. Negative for dysphoric mood. The patient is nervous/anxious.        Objective:   Physical Exam  Constitutional: He appears well-developed and well-nourished. No distress.  Cardiovascular: Normal rate, regular rhythm, normal heart sounds and intact distal pulses.  Exam reveals no gallop and no friction rub.   No murmur heard. Pulmonary/Chest: Effort normal and breath sounds normal. No respiratory distress. He has no wheezes. He has no rales. He exhibits tenderness.  Psychiatric: He has a normal mood and affect. His behavior is normal. Judgment and thought content normal.  all toenails are yellow and thickened         Assessment & Plan:  He has costochondritis that is almost resolved. We discussed th nature of this. Use Motrin prn. Treat the toenail fungus with Lamisil. Try Lorazepam for anxiety and for sleep.

## 2010-04-16 ENCOUNTER — Ambulatory Visit (INDEPENDENT_AMBULATORY_CARE_PROVIDER_SITE_OTHER): Payer: 59 | Admitting: Family Medicine

## 2010-04-16 ENCOUNTER — Encounter: Payer: Self-pay | Admitting: Family Medicine

## 2010-04-16 VITALS — BP 130/90 | HR 106 | Temp 98.6°F | Wt 201.0 lb

## 2010-04-16 DIAGNOSIS — J309 Allergic rhinitis, unspecified: Secondary | ICD-10-CM

## 2010-04-16 DIAGNOSIS — J302 Other seasonal allergic rhinitis: Secondary | ICD-10-CM

## 2010-04-16 MED ORDER — METHYLPREDNISOLONE ACETATE 80 MG/ML IJ SUSP
120.0000 mg | Freq: Once | INTRAMUSCULAR | Status: AC
  Administered 2010-04-16: 120 mg via INTRAMUSCULAR

## 2010-04-16 NOTE — Progress Notes (Signed)
  Subjective:    Patient ID: Dale [name], male    DOB: 1960/05/01, 50 y.o.   MRN: [id number]  HPI Here for his typical spring time allergies. These cause itchy eyes, runny nose, head congestion , and coughing. He has had these for the past week. He has gotten steroid shots in the past which were very helpful.    Review of Systems  Constitutional: Negative.   HENT: Positive for congestion, rhinorrhea and sneezing.   Eyes: Negative.   Respiratory: Positive for cough.   Skin: Negative.        Objective:   Physical Exam  Constitutional: He appears well-developed and well-nourished.  HENT:  Head: Normocephalic and atraumatic.  Right Ear: External ear normal.  Left Ear: External ear normal.  Nose: Nose normal.  Mouth/Throat: Oropharynx is clear and moist. No oropharyngeal exudate.  Eyes: Conjunctivae and EOM are normal. Pupils are equal, round, and reactive to light.  Neck: Normal range of motion. Neck supple.  Pulmonary/Chest: Effort normal and breath sounds normal.  Lymphadenopathy:    He has no cervical adenopathy.          Assessment & Plan:  Given a steroid shot today.

## 2010-06-03 ENCOUNTER — Ambulatory Visit (INDEPENDENT_AMBULATORY_CARE_PROVIDER_SITE_OTHER): Payer: 59 | Admitting: Family Medicine

## 2010-06-03 ENCOUNTER — Encounter: Payer: Self-pay | Admitting: Family Medicine

## 2010-06-03 VITALS — BP 126/78 | HR 91 | Temp 98.6°F | Resp 16 | Wt 200.0 lb

## 2010-06-03 DIAGNOSIS — J309 Allergic rhinitis, unspecified: Secondary | ICD-10-CM

## 2010-06-03 MED ORDER — METHYLPREDNISOLONE ACETATE 80 MG/ML IJ SUSP
120.0000 mg | Freq: Once | INTRAMUSCULAR | Status: AC
  Administered 2010-06-03: 120 mg via INTRAMUSCULAR

## 2010-06-03 MED ORDER — LORAZEPAM 0.5 MG PO TABS: 0.5000 mg | ORAL_TABLET | Freq: Three times a day (TID) | ORAL | Status: DC | PRN

## 2010-06-03 NOTE — Progress Notes (Signed)
  Subjective:    Patient ID: Dale [name], male    DOB: May 07, 1960, 50 y.o.   MRN: [id number]  HPI Here for another flare of allergies. He got a steroid shot here on 04-16-10, and as usual he had an excellent response to this. His symptoms improved dramatically for about 6 weeks, then they started coming back. He has itchy eyes, sneezing, and PND. No cough or fever. On Claritin D daily.   Review of Systems  Constitutional: Negative.   HENT: Positive for rhinorrhea, sneezing and postnasal drip.   Eyes: Positive for itching.  Respiratory: Negative.        Objective:   Physical Exam  Constitutional: He appears well-developed and well-nourished.  HENT:  Right Ear: External ear normal.  Left Ear: External ear normal.  Nose: Nose normal.  Mouth/Throat: Oropharynx is clear and moist. No oropharyngeal exudate.  Eyes: Conjunctivae are normal. Pupils are equal, round, and reactive to light. Right eye exhibits no discharge. Left eye exhibits no discharge.  Neck: No thyromegaly present.  Pulmonary/Chest: Effort normal and breath sounds normal.  Lymphadenopathy:    He has no cervical adenopathy.          Assessment & Plan:  Given another steroid shot. This should get him through the rest of the springtime allergy season.

## 2010-06-06 NOTE — Op Note (Signed)
[place]  Patient:    TORIE, PRIEBE                        MRN: [id number] Proc. Date: 06/10/00 Adm. Date:  [id number] Attending:  Maryanna Shape                           Operative Report  PREOPERATIVE DIAGNOSIS:  Right knee loose body and osteochondritis dissecans.  POSTOPERATIVE DIAGNOSIS:  Right knee loose body and osteochondritis dissecans. Small posterior horn flap tear.  PROCEDURE:  Arthroscopic evaluation right knee followed by finding and removal of 1.5 x 1 cm osteochondral loose body and debridement of small posterior horn flap tear of the medial meniscus.  ANESTHESIA:  General.  DESCRIPTION OF PROCEDURE:  The patient was given a general anesthetic.  A tourniquet was placed on the proximal thigh.  Lower extremity prepped and draped in the usual manner for sterility and anteromedial/anterolateral portal was established.  I initially looked around to find the loose body and it was found in the suprapatellar pouch.  It could not be removed using the rotary meniscotome, and then I tried a couple of different graspers, in that it was quite large and when touched would dance around and become difficult to trap. The operative portal was enlarged with a knife and I used a large grasper. Finally, I got the loose body free of the suprapatellar pouch, which moved past the patella with great difficulty and at this point it popped loose.  It was then again found posteromedially, grasped, and then easily extracted through the operative portal.  The patient had osteochondritis dissecans defect before that was drilled in an attempt to get it to heal.  This loose body probably came from that area and the bed of that area appeared to be healed.  There was a small flap tear at the posterior horn of the medial meniscus and that was removed with a rotary meniscotome.  See photographs showing the divot from the osteochondritis dissecans defect.  The  rest of the meniscus was in fairly good condition.  The anterior cruciate ligament appeared normal.  Laterally, the articular surfaces appeared normal.  The lateral meniscus appeared normal except for minor irregularities along its edge, which I did not think needed to be addressed.  The wounds were approximated.  Marcaine with epinephrine was injected in the joint.  A bulky dressing applied.  DISCHARGE INSTRUCTIONS:  Cold packs as tolerated.  Frequent quad sets and ankle pumps.  Minimal knee motion for a couple of days to minimize swelling and bleeding.  Full weightbearing.  Vicodin for pain.  See me in the office as planned.  Call sooner with concerns. DD:  06/10/00 TD:  06/10/00 Job: [id number]

## 2010-06-22 ENCOUNTER — Other Ambulatory Visit: Payer: Self-pay | Admitting: Family Medicine

## 2010-09-29 ENCOUNTER — Ambulatory Visit (INDEPENDENT_AMBULATORY_CARE_PROVIDER_SITE_OTHER): Payer: 59 | Admitting: Family Medicine

## 2010-09-29 ENCOUNTER — Encounter: Payer: Self-pay | Admitting: Family Medicine

## 2010-09-29 VITALS — BP 130/80 | HR 90 | Temp 98.1°F | Wt 204.0 lb

## 2010-09-29 DIAGNOSIS — R350 Frequency of micturition: Secondary | ICD-10-CM

## 2010-09-29 DIAGNOSIS — Z209 Contact with and (suspected) exposure to unspecified communicable disease: Secondary | ICD-10-CM

## 2010-09-29 LAB — POCT URINALYSIS DIPSTICK
Glucose, UA: NEGATIVE
Ketones, UA: NEGATIVE
Leukocytes, UA: NEGATIVE
Nitrite, UA: NEGATIVE
Spec Grav, UA: 1.025
Urobilinogen, UA: 0.2
pH, UA: 6

## 2010-09-29 NOTE — Progress Notes (Signed)
  Subjective:    Patient ID: Dale [name], male    DOB: 1960/02/12, 50 y.o.   MRN: [id number]  HPI Here asking about exposures to diseases. His girlfriend was recently diagnosed with a bacterial vaginal infection, and he is concerned that he may catch this. He has no sympotms at all, no burning, no discharge, etc. Also he is worried about an incident that occurred a few years ago. A friend got sick and vomited some blood on the floor at a hotel where they were sharing a room. He helped to clean this up,, but he learned that the friend had had hepatitis in the past. Dale [name] is worried he could have caught this. Of note his liver enzymes were normal during a cpx here less than a year ago.   Review of Systems  Constitutional: Negative.   Gastrointestinal: Negative.   Genitourinary: Negative.        Objective:   Physical Exam  Constitutional: He appears well-developed and well-nourished.  Eyes: Conjunctivae are normal.  Abdominal: Soft. Bowel sounds are normal. He exhibits no distension and no mass. There is no tenderness. There is no rebound and no guarding.          Assessment & Plan:  We will check for STDs today, also for hepatitis B and C.

## 2010-09-30 LAB — HEPATIC FUNCTION PANEL
ALT: 33 U/L (ref 0–53)
AST: 26 U/L (ref 0–37)
Albumin: 4.9 g/dL (ref 3.5–5.2)
Alkaline Phosphatase: 65 U/L (ref 39–117)
Bilirubin, Direct: 0.1 mg/dL (ref 0.0–0.3)
Total Bilirubin: 1.6 mg/dL — ABNORMAL HIGH (ref 0.3–1.2)
Total Protein: 7.8 g/dL (ref 6.0–8.3)

## 2010-09-30 LAB — HEPATITIS B SURFACE ANTIGEN: Hepatitis B Surface Ag: NEGATIVE

## 2010-09-30 LAB — HEPATITIS B SURFACE ANTIBODY,QUALITATIVE: Hep B S Ab: NEGATIVE

## 2010-09-30 LAB — HEPATITIS C ANTIBODY: HCV Ab: NEGATIVE

## 2010-10-01 LAB — CHLAMYDIA PROBE AMPLIFICATION, URINE: Chlamydia, Swab/Urine, PCR: NEGATIVE

## 2010-10-01 LAB — GC PROBE AMPLIFICATION, URINE: GC Probe Amp, Urine: NEGATIVE

## 2010-10-02 ENCOUNTER — Telehealth: Payer: Self-pay | Admitting: Family Medicine

## 2010-10-02 NOTE — Telephone Encounter (Signed)
Message copied by Baldemar Friday on Thu Oct 02, 2010  3:48 PM ------      Message from: [name] A      Created: Thu Oct 02, 2010 12:34 PM       Negative for STDs and negative for any type of hepatitis

## 2010-10-02 NOTE — Telephone Encounter (Signed)
Left voice message with stating results are negative.

## 2010-10-03 ENCOUNTER — Telehealth: Payer: Self-pay | Admitting: Family Medicine

## 2010-10-03 NOTE — Telephone Encounter (Signed)
Pt called and requested a copy of labs to be sent. I put in mail today.

## 2010-12-25 ENCOUNTER — Telehealth: Payer: Self-pay | Admitting: Family Medicine

## 2010-12-25 NOTE — Telephone Encounter (Signed)
Call in #[phone] with 5 rf 

## 2010-12-25 NOTE — Telephone Encounter (Signed)
Refill request for Lorazepam 0.5 mg take 1 po q8hrs prn and pt last here on 09/29/10.

## 2010-12-26 MED ORDER — LORAZEPAM 0.5 MG PO TABS: 0.5000 mg | ORAL_TABLET | Freq: Three times a day (TID) | ORAL | Status: DC | PRN

## 2010-12-26 NOTE — Telephone Encounter (Signed)
Script called in

## 2011-01-05 ENCOUNTER — Ambulatory Visit (INDEPENDENT_AMBULATORY_CARE_PROVIDER_SITE_OTHER): Payer: 59 | Admitting: Family Medicine

## 2011-01-05 ENCOUNTER — Encounter: Payer: Self-pay | Admitting: Family Medicine

## 2011-01-05 VITALS — BP 136/78 | HR 95 | Temp 98.4°F | Wt 206.0 lb

## 2011-01-05 DIAGNOSIS — F419 Anxiety disorder, unspecified: Secondary | ICD-10-CM

## 2011-01-05 DIAGNOSIS — F411 Generalized anxiety disorder: Secondary | ICD-10-CM

## 2011-01-05 DIAGNOSIS — R002 Palpitations: Secondary | ICD-10-CM

## 2011-01-05 MED ORDER — CITALOPRAM HYDROBROMIDE 20 MG PO TABS: 20.0000 mg | ORAL_TABLET | Freq: Every day | ORAL | Status: DC

## 2011-01-05 NOTE — Progress Notes (Signed)
  Subjective:    Patient ID: Dale [name], male    DOB: January 03, 1961, 50 y.o.   MRN: [id number]  HPI Here for 2 weeks of intermittent palpitations. He describes racing or skipping in his chest which is not related to exertion. No SOB or chest pains. In fact he has been riding his bicycle a lot for exercise, and he always feels fine when exerting himself. He admits to being under a lot of stress lately, since he has been helping his mother with some difficult financial problems. He takes one Ativan at bedtime for sleep and only occasionally during the day.    Review of Systems  Constitutional: Negative.   Respiratory: Negative.   Cardiovascular: Positive for palpitations. Negative for chest pain and leg swelling.  Psychiatric/Behavioral: Positive for sleep disturbance. Negative for confusion, dysphoric mood and decreased concentration. The patient is nervous/anxious.        Objective:   Physical Exam  Constitutional: He appears well-developed and well-nourished.  Neck: Neck supple. No thyromegaly present.  Cardiovascular: Normal rate, regular rhythm, normal heart sounds and intact distal pulses.  Exam reveals no gallop and no friction rub.   No murmur heard. Pulmonary/Chest: Effort normal and breath sounds normal.  Lymphadenopathy:    He has no cervical adenopathy.  Psychiatric: He has a normal mood and affect. His behavior is normal. Thought content normal.          Assessment & Plan:  These palpitations are probably due to anxiety, so we will start him on Celexa daily. Use Ativan in addition as needed. Set up a Holter soon to evaluate the heart rhythms.

## 2011-01-07 ENCOUNTER — Telehealth: Payer: Self-pay

## 2011-01-08 ENCOUNTER — Telehealth: Payer: Self-pay | Admitting: Family Medicine

## 2011-01-08 NOTE — Telephone Encounter (Signed)
Follow-up:    Patient returned your phone call about scheduling a monitor placement. Please call back.

## 2011-01-23 ENCOUNTER — Encounter (INDEPENDENT_AMBULATORY_CARE_PROVIDER_SITE_OTHER): Payer: 59

## 2011-01-23 DIAGNOSIS — R002 Palpitations: Secondary | ICD-10-CM

## 2011-01-29 NOTE — Progress Notes (Signed)
Quick Note:  Left voice message ______ 

## 2011-04-23 ENCOUNTER — Encounter: Payer: Self-pay | Admitting: Family

## 2011-04-23 ENCOUNTER — Ambulatory Visit (INDEPENDENT_AMBULATORY_CARE_PROVIDER_SITE_OTHER): Payer: 59 | Admitting: Family

## 2011-04-23 DIAGNOSIS — J309 Allergic rhinitis, unspecified: Secondary | ICD-10-CM

## 2011-04-23 DIAGNOSIS — F411 Generalized anxiety disorder: Secondary | ICD-10-CM

## 2011-04-23 DIAGNOSIS — F419 Anxiety disorder, unspecified: Secondary | ICD-10-CM

## 2011-04-23 MED ORDER — LORAZEPAM 0.5 MG PO TABS: 0.5000 mg | ORAL_TABLET | Freq: Three times a day (TID) | ORAL | Status: DC | PRN

## 2011-04-23 MED ORDER — SILDENAFIL CITRATE 100 MG PO TABS: 100.0000 mg | ORAL_TABLET | ORAL | Status: DC | PRN

## 2011-04-23 MED ORDER — METHYLPREDNISOLONE ACETATE 80 MG/ML IJ SUSP: 120.0000 mg | Freq: Once | INTRAMUSCULAR | Status: DC

## 2011-04-23 NOTE — Telephone Encounter (Signed)
Patient had monitor 

## 2011-04-23 NOTE — Progress Notes (Signed)
Subjective:    Patient ID: Dale [name], male    DOB: May 02, 1960, 51 y.o.   MRN: [id number]  HPI 50 y/o male is in today requesting a Depo Medrol to assist with controlling his allergies. He takes Claritin daily. He is also requesting a refill on his Xanax. He is stable on medication regimen. Denies any concerns.   Review of Systems  Constitutional: Negative.   HENT: Negative.   Respiratory: Negative.   Cardiovascular: Negative.   Musculoskeletal: Negative.   Skin: Negative.   Neurological: Negative.   Hematological: Negative.   Psychiatric/Behavioral: Negative.    Past Medical History  Diagnosis Date  . Allergy   . Depression   . Hyperlipidemia   . History of nephrolithiasis   . Chest pain, non-cardiac     normal stress test on 08-26-09     History   Social History  . Marital Status: Single    Spouse Name: N/A    Number of Children: N/A  . Years of Education: N/A   Occupational History  . Not on file.   Social History Main Topics  . Smoking status: Never Smoker   . Smokeless tobacco: Never Used  . Alcohol Use: Yes  . Drug Use: No  . Sexually Active: Not on file   Other Topics Concern  . Not on file   Social History Narrative  . No narrative on file    Past Surgical History  Procedure Date  . Knee surgery 83, 89, 90, 01, 02    rt knee x 5  . Knee surgery 2007    left knee meniscus repair  . Rotator cuff repair 2003  . Lumbar spine surgery 2011    Family History  Problem Relation Age of Onset  . Heart attack Father   . Coronary artery disease Father   . Depression      family hx  . Hyperlipidemia      family hx  . Hypertension      family hx  . Lung cancer      family hx  . Stroke      family hx    No Known Allergies  Current Outpatient Prescriptions on File Prior to Visit  Medication Sig Dispense Refill  . aspirin 81 MG tablet Take 81 mg by mouth daily.        Marland Kitchen loratadine-pseudoephedrine (CLARITIN-D 24-HOUR) 10-240 MG per 24 hr  tablet Take 1 tablet by mouth daily.        . Multiple Vitamins-Minerals (MULTIVITAMIN,TX-MINERALS) tablet Take 1 tablet by mouth daily.        Marland Kitchen DISCONTD: VIAGRA 100 MG tablet TAKE AS NEEDED  10 tablet  9  . citalopram (CELEXA) 20 MG tablet Take 1 tablet (20 mg total) by mouth daily.  30 tablet  3  . simvastatin (ZOCOR) 40 MG tablet Take 40 mg by mouth at bedtime.          BP 122/90  Temp(Src) 98.6 F (37 C) (Oral)  Wt 199 lb 8 oz (90.493 kg)chart    Objective:   Physical Exam  Constitutional: He is oriented to person, place, and time. He appears well-developed and well-nourished.  HENT:  Right Ear: External ear normal.  Left Ear: External ear normal.  Nose: Nose normal.  Mouth/Throat: Oropharynx is clear and moist.  Neck: Normal range of motion. Neck supple.  Cardiovascular: Normal rate, regular rhythm and normal heart sounds.   Pulmonary/Chest: Effort normal and breath sounds normal.  Musculoskeletal: Normal range  of motion.  Neurological: He is alert and oriented to person, place, and time.  Skin: Skin is warm and dry.  Psychiatric: He has a normal mood and affect.      Depo-Medrol 120mg  IM x1    Assessment & Plan:  Assessment: Allergic Rhinitis, Anxiety  Plan: Refilled Xanax.  Continue Claritin. Call the office if symptoms worsen or persist. Recheck for a CPX with PCP.

## 2011-04-23 NOTE — Patient Instructions (Signed)
1. Return for CPX  Allergies, Generic Allergies may happen from anything your body is sensitive to. This may be food, medicines, pollens, chemicals, and nearly anything around you in everyday life that produces allergens. An allergen is anything that causes an allergy producing substance. Heredity is often a factor in causing these problems. This means you may have some of the same allergies as your parents. Food allergies happen in all age groups. Food allergies are some of the most severe and life threatening. Some common food allergies are cow's milk, seafood, eggs, nuts, wheat, and soybeans. SYMPTOMS   Swelling around the mouth.   An itchy red rash or hives.   Vomiting or diarrhea.   Difficulty breathing.  SEVERE ALLERGIC REACTIONS ARE LIFE-THREATENING. This reaction is called anaphylaxis. It can cause the mouth and throat to swell and cause difficulty with breathing and swallowing. In severe reactions only a trace amount of food (for example, peanut oil in a salad) may cause death within seconds. Seasonal allergies occur in all age groups. These are seasonal because they usually occur during the same season every year. They may be a reaction to molds, grass pollens, or tree pollens. Other causes of problems are house dust mite allergens, pet dander, and mold spores. The symptoms often consist of nasal congestion, a runny itchy nose associated with sneezing, and tearing itchy eyes. There is often an associated itching of the mouth and ears. The problems happen when you come in contact with pollens and other allergens. Allergens are the particles in the air that the body reacts to with an allergic reaction. This causes you to release allergic antibodies. Through a chain of events, these eventually cause you to release histamine into the blood stream. Although it is meant to be protective to the body, it is this release that causes your discomfort. This is why you were given anti-histamines to feel  better. If you are unable to pinpoint the offending allergen, it may be determined by skin or blood testing. Allergies cannot be cured but can be controlled with medicine. Hay fever is a collection of all or some of the seasonal allergy problems. It may often be treated with simple over-the-counter medicine such as diphenhydramine. Take medicine as directed. Do not drink alcohol or drive while taking this medicine. Check with your caregiver or package insert for child dosages. If these medicines are not effective, there are many new medicines your caregiver can prescribe. Stronger medicine such as nasal spray, eye drops, and corticosteroids may be used if the first things you try do not work well. Other treatments such as immunotherapy or desensitizing injections can be used if all else fails. Follow up with your caregiver if problems continue. These seasonal allergies are usually not life threatening. They are generally more of a nuisance that can often be handled using medicine. HOME CARE INSTRUCTIONS   If unsure what causes a reaction, keep a diary of foods eaten and symptoms that follow. Avoid foods that cause reactions.   If hives or rash are present:   Take medicine as directed.   You may use an over-the-counter antihistamine (diphenhydramine) for hives and itching as needed.   Apply cold compresses (cloths) to the skin or take baths in cool water. Avoid hot baths or showers. Heat will make a rash and itching worse.   If you are severely allergic:   Following a treatment for a severe reaction, hospitalization is often required for closer follow-up.   Wear a medic-alert bracelet or  necklace stating the allergy.   You and your family must learn how to give adrenaline or use an anaphylaxis kit.   If you have had a severe reaction, always carry your anaphylaxis kit or EpiPen with you. Use this medicine as directed by your caregiver if a severe reaction is occurring. Failure to do so could  have a fatal outcome.  SEEK MEDICAL CARE IF:  You suspect a food allergy. Symptoms generally happen within 30 minutes of eating a food.   Your symptoms have not gone away within 2 days or are getting worse.   You develop new symptoms.   You want to retest yourself or your child with a food or drink you think causes an allergic reaction. Never do this if an anaphylactic reaction to that food or drink has happened before. Only do this under the care of a caregiver.  SEEK IMMEDIATE MEDICAL CARE IF:   You have difficulty breathing, are wheezing, or have a tight feeling in your chest or throat.   You have a swollen mouth, or you have hives, swelling, or itching all over your body.   You have had a severe reaction that has responded to your anaphylaxis kit or an EpiPen. These reactions may return when the medicine has worn off. These reactions should be considered life threatening.  MAKE SURE YOU:   Understand these instructions.   Will watch your condition.   Will get help right away if you are not doing well or get worse.  Document Released: 03/31/2002 Document Revised: 12/25/2010 Document Reviewed: 09/05/2007 [facility] Patient Information 2012 [place], [place].

## 2011-04-27 ENCOUNTER — Ambulatory Visit: Payer: 59 | Admitting: Family Medicine

## 2011-05-22 ENCOUNTER — Other Ambulatory Visit (INDEPENDENT_AMBULATORY_CARE_PROVIDER_SITE_OTHER): Payer: 59

## 2011-05-22 DIAGNOSIS — Z0000 Encounter for general adult medical examination without abnormal findings: Secondary | ICD-10-CM

## 2011-05-22 LAB — BASIC METABOLIC PANEL
BUN: 16 mg/dL (ref 6–23)
CO2: 28 mEq/L (ref 19–32)
Calcium: 9 mg/dL (ref 8.4–10.5)
Chloride: 103 mEq/L (ref 96–112)
Creatinine, Ser: 1 mg/dL (ref 0.4–1.5)
GFR: 85.3 mL/min (ref >60.00)
Glucose, Bld: 97 mg/dL (ref 70–99)
Potassium: 4.3 mEq/L (ref 3.5–5.1)
Sodium: 140 mEq/L (ref 135–145)

## 2011-05-22 LAB — LDL CHOLESTEROL, DIRECT: Direct LDL: 149.7 mg/dL

## 2011-05-22 LAB — HEPATIC FUNCTION PANEL
ALT: 25 U/L (ref 0–53)
AST: 19 U/L (ref 0–37)
Albumin: 4.4 g/dL (ref 3.5–5.2)
Alkaline Phosphatase: 63 U/L (ref 39–117)
Bilirubin, Direct: 0.2 mg/dL (ref 0.0–0.3)
Total Bilirubin: 1.8 mg/dL — ABNORMAL HIGH (ref 0.3–1.2)
Total Protein: 7.1 g/dL (ref 6.0–8.3)

## 2011-05-22 LAB — CBC WITH DIFFERENTIAL/PLATELET
Basophils Absolute: 0.1 10*3/uL (ref 0.0–0.1)
Basophils Relative: 1 % (ref 0.0–3.0)
Eosinophils Absolute: 0.1 10*3/uL (ref 0.0–0.7)
Eosinophils Relative: 2.2 % (ref 0.0–5.0)
HCT: 46 % (ref 39.0–52.0)
Hemoglobin: 15.5 g/dL (ref 13.0–17.0)
Lymphocytes Relative: 30 % (ref 12.0–46.0)
Lymphs Abs: 1.7 10*3/uL (ref 0.7–4.0)
MCHC: 33.6 g/dL (ref 30.0–36.0)
MCV: 92 fl (ref 78.0–100.0)
Monocytes Absolute: 0.6 10*3/uL (ref 0.1–1.0)
Monocytes Relative: 10.1 % (ref 3.0–12.0)
Neutro Abs: 3.2 10*3/uL (ref 1.4–7.7)
Neutrophils Relative %: 56.7 % (ref 43.0–77.0)
Platelets: 156 10*3/uL (ref 150.0–400.0)
RBC: 5.01 Mil/uL (ref 4.22–5.81)
RDW: 13.2 % (ref 11.5–14.6)
WBC: 5.7 10*3/uL (ref 4.5–10.5)

## 2011-05-22 LAB — POCT URINALYSIS DIPSTICK
Bilirubin, UA: NEGATIVE
Blood, UA: NEGATIVE
Glucose, UA: NEGATIVE
Ketones, UA: NEGATIVE
Leukocytes, UA: NEGATIVE
Nitrite, UA: NEGATIVE
Protein, UA: NEGATIVE
Spec Grav, UA: 1.025
Urobilinogen, UA: 0.2
pH, UA: 5.5

## 2011-05-22 LAB — LIPID PANEL
Cholesterol: 207 mg/dL — ABNORMAL HIGH (ref 0–200)
HDL: 52.6 mg/dL (ref >39.00)
Total CHOL/HDL Ratio: 4
Triglycerides: 74 mg/dL (ref 0.0–149.0)
VLDL: 14.8 mg/dL (ref 0.0–40.0)

## 2011-05-22 LAB — TSH: TSH: 1.71 u[IU]/mL (ref 0.35–5.50)

## 2011-05-27 ENCOUNTER — Encounter: Payer: Self-pay | Admitting: Family Medicine

## 2011-05-27 NOTE — Progress Notes (Signed)
Quick Note:  I spoke with pt and put a copy of results in mail. ______ 

## 2011-06-01 ENCOUNTER — Ambulatory Visit (INDEPENDENT_AMBULATORY_CARE_PROVIDER_SITE_OTHER): Payer: 59 | Admitting: Family Medicine

## 2011-06-01 ENCOUNTER — Telehealth: Payer: Self-pay | Admitting: Family Medicine

## 2011-06-01 ENCOUNTER — Encounter: Payer: Self-pay | Admitting: Family Medicine

## 2011-06-01 VITALS — BP 140/98 | HR 131 | Temp 99.0°F | Wt 192.0 lb

## 2011-06-01 DIAGNOSIS — J329 Chronic sinusitis, unspecified: Secondary | ICD-10-CM

## 2011-06-01 MED ORDER — AZITHROMYCIN 250 MG PO TABS: ORAL_TABLET | ORAL | Status: AC

## 2011-06-01 MED ORDER — METHYLPREDNISOLONE ACETATE 80 MG/ML IJ SUSP
120.0000 mg | Freq: Once | INTRAMUSCULAR | Status: AC
  Administered 2011-06-01: 120 mg via INTRAMUSCULAR

## 2011-06-01 NOTE — Progress Notes (Signed)
Addended by: Aniceto Boss A on: 06/01/2011 12:16 PM   Modules accepted: Orders

## 2011-06-01 NOTE — Telephone Encounter (Signed)
Pt stated that he is waiting on a Z-pack to be called in. He was just seen here today. Per Dr. Clent Ridges, okay to send in and I did send e-scribe and spoke with pt.

## 2011-06-01 NOTE — Progress Notes (Signed)
  Subjective:    Patient ID: Juanita Craver, male    DOB: October 07, 1960, 51 y.o.   MRN: [id number]  HPI Here for 4 days of sinus pressure, PND, ST, and a dry cough. No fever. On Claritin and Mucinex.    Review of Systems  Constitutional: Negative.   HENT: Positive for congestion, postnasal drip and sinus pressure.   Eyes: Negative.   Respiratory: Positive for cough.        Objective:   Physical Exam  Constitutional: He appears well-developed and well-nourished.  HENT:  Right Ear: External ear normal.  Left Ear: External ear normal.  Nose: Nose normal.  Mouth/Throat: Oropharynx is clear and moist. No oropharyngeal exudate.  Eyes: Conjunctivae are normal.  Neck: No thyromegaly present.  Pulmonary/Chest: Effort normal and breath sounds normal.  Lymphadenopathy:    He has no cervical adenopathy.          Assessment & Plan:  We will recheck at his cpx later this week.

## 2011-06-04 ENCOUNTER — Ambulatory Visit (INDEPENDENT_AMBULATORY_CARE_PROVIDER_SITE_OTHER): Payer: 59 | Admitting: Family Medicine

## 2011-06-04 ENCOUNTER — Encounter: Payer: Self-pay | Admitting: Family Medicine

## 2011-06-04 VITALS — BP 138/84 | HR 92 | Temp 98.8°F | Ht 70.25 in | Wt 188.0 lb

## 2011-06-04 DIAGNOSIS — Z0000 Encounter for general adult medical examination without abnormal findings: Secondary | ICD-10-CM

## 2011-06-04 DIAGNOSIS — E785 Hyperlipidemia, unspecified: Secondary | ICD-10-CM

## 2011-06-04 DIAGNOSIS — N529 Male erectile dysfunction, unspecified: Secondary | ICD-10-CM

## 2011-06-04 MED ORDER — ATORVASTATIN CALCIUM 10 MG PO TABS: 10.0000 mg | ORAL_TABLET | Freq: Every day | ORAL | Status: DC

## 2011-06-04 NOTE — Progress Notes (Signed)
  Subjective:    Patient ID: Dale [name], male    DOB: 12-01-1960, 51 y.o.   MRN: [id number]  HPI 51 yr old male for a cpx. He has a few issues to discuss. We had tried Zocor last year for his cholesterol, but he stopped this after only a month or two because he thought it caused HAs and insomnia. He has really worked hard on his diet, he is exercising, and he has started using fish oil capsules. He also asks about erectile problems. He has success with Viagra, but he asks about checking his testosterone level.    Review of Systems  Constitutional: Negative.   HENT: Negative.   Eyes: Negative.   Respiratory: Negative.   Cardiovascular: Negative.   Gastrointestinal: Negative.   Genitourinary: Negative.   Musculoskeletal: Negative.   Skin: Negative.   Neurological: Negative.   Hematological: Negative.   Psychiatric/Behavioral: Negative.        Objective:   Physical Exam  Constitutional: He is oriented to person, place, and time. He appears well-developed and well-nourished. No distress.  HENT:  Head: Normocephalic and atraumatic.  Right Ear: External ear normal.  Left Ear: External ear normal.  Nose: Nose normal.  Mouth/Throat: Oropharynx is clear and moist. No oropharyngeal exudate.  Eyes: Conjunctivae and EOM are normal. Pupils are equal, round, and reactive to light. Right eye exhibits no discharge. Left eye exhibits no discharge. No scleral icterus.  Neck: Neck supple. No JVD present. No tracheal deviation present. No thyromegaly present.  Cardiovascular: Normal rate, regular rhythm, normal heart sounds and intact distal pulses.  Exam reveals no gallop and no friction rub.   No murmur heard. Pulmonary/Chest: Effort normal and breath sounds normal. No respiratory distress. He has no wheezes. He has no rales. He exhibits no tenderness.  Abdominal: Soft. Bowel sounds are normal. He exhibits no distension and no mass. There is no tenderness. There is no rebound and no guarding.    Genitourinary: Rectum normal, prostate normal and penis normal. Guaiac negative stool. No penile tenderness.  Musculoskeletal: Normal range of motion. He exhibits no edema and no tenderness.  Lymphadenopathy:    He has no cervical adenopathy.  Neurological: He is alert and oriented to person, place, and time. He has normal reflexes. No cranial nerve deficit. He exhibits normal muscle tone. Coordination normal.  Skin: Skin is warm and dry. No rash noted. He is not diaphoretic. No erythema. No pallor.  Psychiatric: He has a normal mood and affect. His behavior is normal. Judgment and thought content normal.          Assessment & Plan:  Well exam. We will start on Lipitor 10 mg a day. Recheck labs in 90 days. We will check a testosterone level at that time as well.

## 2011-07-13 ENCOUNTER — Telehealth: Payer: Self-pay | Admitting: Family Medicine

## 2011-07-13 NOTE — Telephone Encounter (Signed)
Patient called stating that he would like to be tested for chlamydia,gonorrhea,hepatitis. Please advise.

## 2011-07-14 ENCOUNTER — Ambulatory Visit (INDEPENDENT_AMBULATORY_CARE_PROVIDER_SITE_OTHER): Payer: 59 | Admitting: Family Medicine

## 2011-07-14 ENCOUNTER — Encounter: Payer: Self-pay | Admitting: Family Medicine

## 2011-07-14 VITALS — BP 138/86 | HR 89 | Temp 99.0°F | Wt 195.0 lb

## 2011-07-14 DIAGNOSIS — Z202 Contact with and (suspected) exposure to infections with a predominantly sexual mode of transmission: Secondary | ICD-10-CM

## 2011-07-14 DIAGNOSIS — Z2089 Contact with and (suspected) exposure to other communicable diseases: Secondary | ICD-10-CM

## 2011-07-14 NOTE — Progress Notes (Signed)
  Subjective:    Patient ID: Dale [name], male    DOB: 03/03/1960, 51 y.o.   MRN: [id number]  HPI Here to get some STD tests. He has had a steady girlfriend for several years, but then he also had sexual relations with another person for several months earlier this year. Now he is worried about anything he may have been exposed to. He has no symptoms.     Review of Systems  Constitutional: Negative.   Genitourinary: Negative.        Objective:   Physical Exam  Constitutional: He appears well-developed and well-nourished.  Genitourinary: Penis normal.  Skin: Skin is warm and dry. No rash noted. No erythema.          Assessment & Plan:  Check for STD exposure.

## 2011-07-14 NOTE — Telephone Encounter (Signed)
Have him make an OV to discuss why he wants these tests

## 2011-07-15 ENCOUNTER — Encounter: Payer: Self-pay | Admitting: Family Medicine

## 2011-07-15 LAB — HIV ANTIBODY (ROUTINE TESTING W REFLEX): HIV: NONREACTIVE

## 2011-07-15 LAB — GC PROBE AMPLIFICATION, URINE: GC Probe Amp, Urine: NEGATIVE

## 2011-07-15 LAB — CHLAMYDIA PROBE AMPLIFICATION, URINE: Chlamydia, Swab/Urine, PCR: NEGATIVE

## 2011-07-15 LAB — RPR

## 2011-07-15 NOTE — Progress Notes (Signed)
Quick Note:  I spoke with pt and put a copy of results in mail. ______ 

## 2011-09-07 ENCOUNTER — Other Ambulatory Visit (INDEPENDENT_AMBULATORY_CARE_PROVIDER_SITE_OTHER): Payer: 59

## 2011-09-07 DIAGNOSIS — N529 Male erectile dysfunction, unspecified: Secondary | ICD-10-CM

## 2011-09-07 DIAGNOSIS — E785 Hyperlipidemia, unspecified: Secondary | ICD-10-CM

## 2011-09-07 LAB — HEPATIC FUNCTION PANEL
ALT: 20 U/L (ref 0–53)
AST: 23 U/L (ref 0–37)
Albumin: 4.2 g/dL (ref 3.5–5.2)
Alkaline Phosphatase: 57 U/L (ref 39–117)
Bilirubin, Direct: 0.2 mg/dL (ref 0.0–0.3)
Total Bilirubin: 1.5 mg/dL — ABNORMAL HIGH (ref 0.3–1.2)
Total Protein: 6.8 g/dL (ref 6.0–8.3)

## 2011-09-07 LAB — LIPID PANEL
Cholesterol: 141 mg/dL (ref 0–200)
HDL: 51.9 mg/dL (ref >39.00)
LDL Cholesterol: 75 mg/dL (ref 0–99)
Total CHOL/HDL Ratio: 3
Triglycerides: 72 mg/dL (ref 0.0–149.0)
VLDL: 14.4 mg/dL (ref 0.0–40.0)

## 2011-09-07 LAB — TESTOSTERONE: Testosterone: 467.6 ng/dL (ref 350.00–890.00)

## 2011-09-09 ENCOUNTER — Encounter: Payer: Self-pay | Admitting: Family Medicine

## 2011-09-09 NOTE — Progress Notes (Signed)
Quick Note:  I spoke with pt and put a copy of results in mail. ______ 

## 2011-10-06 ENCOUNTER — Encounter: Payer: Self-pay | Admitting: Family Medicine

## 2011-10-06 ENCOUNTER — Ambulatory Visit (INDEPENDENT_AMBULATORY_CARE_PROVIDER_SITE_OTHER): Payer: 59 | Admitting: Family Medicine

## 2011-10-06 VITALS — BP 128/80 | HR 92 | Temp 98.7°F | Wt 195.0 lb

## 2011-10-06 DIAGNOSIS — K219 Gastro-esophageal reflux disease without esophagitis: Secondary | ICD-10-CM

## 2011-10-06 MED ORDER — OMEPRAZOLE 40 MG PO CPDR: 40.0000 mg | DELAYED_RELEASE_CAPSULE | Freq: Every day | ORAL | Status: DC

## 2011-10-06 NOTE — Progress Notes (Signed)
  Subjective:    Patient ID: Juanita Craver, male    DOB: 10/28/1960, 51 y.o.   MRN: [id number]  HPI Here for worsening GERD symptoms. He has had this from time to time over the years but lately it happens every day. He has burning in the chest and epigastrium, as well as some belching. No trouble swallowing. No nausea.    Review of Systems  Constitutional: Negative.   Respiratory: Negative.   Cardiovascular: Negative.   Gastrointestinal: Positive for abdominal pain. Negative for nausea, vomiting, diarrhea, constipation, blood in stool and abdominal distention.       Objective:   Physical Exam  Constitutional: He appears well-developed and well-nourished.  Cardiovascular: Normal rate, regular rhythm, normal heart sounds and intact distal pulses.   Pulmonary/Chest: Effort normal and breath sounds normal.  Abdominal: Soft. Bowel sounds are normal. He exhibits no distension and no mass. There is no tenderness. There is no rebound and no guarding.          Assessment & Plan:  We also discussed diet changes he could make.

## 2011-10-08 ENCOUNTER — Telehealth: Payer: Self-pay | Admitting: Family Medicine

## 2011-10-08 NOTE — Telephone Encounter (Signed)
Pls advise.  

## 2011-10-08 NOTE — Telephone Encounter (Signed)
Caller: Javarri/Patient; Patient Name: Dale [name]; PCP: [name] [facility]); Best Callback Phone Number: [phone]; Reason for call: Other Onset- 10/07/11 Pt was in office on 10/06/11 due to GERD. He reports that since starting Omeprazole he is having a pain in his abdomen, located in the middle of his abdomen. He rates the pain at a 6. Emergent s/s of Abdominal Pain protocol r/o. Pt to see provider within 24hrs. Pt offered an appointmnet for tomorrow. Pt would like a message sent first to see if doctor has any recommendations before he comes back in for appointment.

## 2011-10-09 NOTE — Telephone Encounter (Signed)
I called and left voice message with below information.

## 2011-10-09 NOTE — Telephone Encounter (Signed)
This sounds like it may be an early ulcer. Stay on Omeprazole but use Mylanta prn on addition to that. If he is not better by next week, let me see him again

## 2011-10-10 ENCOUNTER — Encounter (HOSPITAL_BASED_OUTPATIENT_CLINIC_OR_DEPARTMENT_OTHER): Payer: Self-pay | Admitting: *Deleted

## 2011-10-10 ENCOUNTER — Emergency Department (HOSPITAL_BASED_OUTPATIENT_CLINIC_OR_DEPARTMENT_OTHER)
Admission: EM | Admit: 2011-10-10 | Discharge: 2011-10-10 | Disposition: A | Discharge status: Home / Self Care | Payer: 59 | Attending: Emergency Medicine | Admitting: Emergency Medicine

## 2011-10-10 DIAGNOSIS — M545 Low back pain, unspecified: Secondary | ICD-10-CM | POA: Insufficient documentation

## 2011-10-10 DIAGNOSIS — E785 Hyperlipidemia, unspecified: Secondary | ICD-10-CM | POA: Insufficient documentation

## 2011-10-10 DIAGNOSIS — G8929 Other chronic pain: Secondary | ICD-10-CM | POA: Insufficient documentation

## 2011-10-10 MED ORDER — HYDROMORPHONE HCL PF 2 MG/ML IJ SOLN
2.0000 mg | Freq: Once | INTRAMUSCULAR | Status: AC
  Administered 2011-10-10: 2 mg via INTRAMUSCULAR

## 2011-10-10 MED ORDER — PREDNISONE 50 MG PO TABS
60.0000 mg | ORAL_TABLET | Freq: Once | ORAL | Status: AC
  Administered 2011-10-10: 10 mg via ORAL

## 2011-10-10 MED ORDER — OXYCODONE-ACETAMINOPHEN 5-325 MG PO TABS: ORAL_TABLET | ORAL | Status: DC

## 2011-10-10 MED ORDER — HYDROMORPHONE HCL PF 1 MG/ML IJ SOLN: 2.0000 mg | Freq: Once | INTRAMUSCULAR | Status: DC

## 2011-10-10 MED ORDER — ONDANSETRON 4 MG PO TBDP
4.0000 mg | ORAL_TABLET | Freq: Once | ORAL | Status: AC
  Administered 2011-10-10: 4 mg via ORAL
  Filled 2011-10-10: qty 1

## 2011-10-10 MED ORDER — HYDROMORPHONE HCL PF 2 MG/ML IJ SOLN
INTRAMUSCULAR | Status: AC
  Administered 2011-10-10: 2 mg via INTRAMUSCULAR
  Filled 2011-10-10: qty 1

## 2011-10-10 MED ORDER — PREDNISONE 50 MG PO TABS
60.0000 mg | ORAL_TABLET | Freq: Once | ORAL | Status: AC
  Administered 2011-10-10: 50 mg via ORAL
  Filled 2011-10-10: qty 1

## 2011-10-10 MED ORDER — PREDNISONE 50 MG PO TABS: ORAL_TABLET | ORAL | Status: DC

## 2011-10-10 NOTE — ED Provider Notes (Signed)
History     CSN: [id number]  Arrival date & time 10/10/11  1412   First MD Initiated Contact with Patient 10/10/11 1529      Chief Complaint  Patient presents with  . Back Pain    (Consider location/radiation/quality/duration/timing/severity/associated sxs/prior treatment) HPI Comments: Pt has h/o chronic low back pain.  He called dr. Ophelia Charter today and was told to take tramadol and rest .  If not better by Monday call  The office and make an appt for Tuesday.    Pain is not improving.  No incontinence.    Patient is a 51 y.o. male presenting with back pain. The history is provided by the patient and the spouse. No language interpreter was used.  Back Pain  This is a chronic problem. The current episode started 2 days ago. The problem occurs constantly. The pain is associated with no known injury. The pain is present in the lumbar spine. The quality of the pain is described as aching. The pain radiates to the left thigh. The pain is severe. The symptoms are aggravated by bending and twisting. Associated symptoms include numbness. Pertinent negatives include no weakness. Treatments tried: traamadol.    Past Medical History  Diagnosis Date  . Allergy   . Depression   . Hyperlipidemia   . History of nephrolithiasis   . Chest pain, non-cardiac     normal stress test on 08-26-09     Past Surgical History  Procedure Date  . Knee surgery 83, 89, 90, 01, 02    rt knee x 5  . Knee surgery 2007    left knee meniscus repair  . Rotator cuff repair 2003  . Lumbar spine surgery 2011    Family History  Problem Relation Age of Onset  . Heart attack Father   . Coronary artery disease Father   . Depression      family hx  . Hyperlipidemia      family hx  . Hypertension      family hx  . Lung cancer      family hx  . Stroke      family hx    History  Substance Use Topics  . Smoking status: Never Smoker   . Smokeless tobacco: Never Used  . Alcohol Use: 1.0 oz/week    2 drink(s)  per week      Review of Systems  Musculoskeletal: Positive for back pain.  Neurological: Positive for numbness. Negative for weakness.  All other systems reviewed and are negative.    Allergies  Review of patient's allergies indicates no known allergies.  Home Medications   Current Outpatient Rx  Name Route Sig Dispense Refill  . ASPIRIN 81 MG PO TABS Oral Take 81 mg by mouth daily.      . ATORVASTATIN CALCIUM 10 MG PO TABS Oral Take 1 tablet (10 mg total) by mouth daily. 30 tablet 11  . GLUCOSAMINE-CHONDROITIN 500-400 MG PO TABS Oral Take 1 tablet by mouth daily.    Marland Kitchen LORATADINE-PSEUDOEPHEDRINE ER 10-240 MG PO TB24 Oral Take 1 tablet by mouth daily.      Marland Kitchen LORAZEPAM 0.5 MG PO TABS Oral Take 1 tablet (0.5 mg total) by mouth every 8 (eight) hours as needed for anxiety. 90 tablet 5  . SUPER HIGH VITAMINS/MINERALS PO TABS Oral Take 1 tablet by mouth daily.      Marland Kitchen OMEPRAZOLE 40 MG PO CPDR Oral Take 1 capsule (40 mg total) by mouth daily. 30 capsule 11  .  OXYCODONE-ACETAMINOPHEN 5-325 MG PO TABS  One tab po q 4-6 hrs prn pain 15 tablet 0  . PREDNISONE 50 MG PO TABS  One tab po QD 6 tablet 0  . SILDENAFIL CITRATE 100 MG PO TABS Oral Take 1 tablet (100 mg total) by mouth as needed for erectile dysfunction. 10 tablet 9    BP 139/82  Pulse 86  Temp 98.5 F (36.9 C) (Oral)  Resp 20  Ht 5\' 11"  (1.803 m)  Wt 190 lb (86.183 kg)  BMI 26.50 kg/m2  SpO2 98%  Physical Exam  Nursing note and vitals reviewed. Constitutional: He is oriented to person, place, and time. He appears well-developed and well-nourished.  HENT:  Head: Normocephalic and atraumatic.  Eyes: EOM are normal.  Neck: Normal range of motion.  Cardiovascular: Normal rate, regular rhythm, normal heart sounds and intact distal pulses.   Pulmonary/Chest: Effort normal and breath sounds normal. No respiratory distress.  Abdominal: Soft. He exhibits no distension. There is no tenderness.  Musculoskeletal: Normal range of  motion. He exhibits tenderness.       Back:  Neurological: He is alert and oriented to person, place, and time.  Skin: Skin is warm and dry.  Psychiatric: He has a normal mood and affect. Judgment normal.    ED Course  Procedures (including critical care time)  Labs Reviewed - No data to display No results found.   1. Acute exacerbation of chronic low back pain       MDM  rx-prednisone 50 mg, 6 rx-percocet, 15 Ice F/u with dr. Ophelia Charter.        Evalina Field, [place] 10/10/11 1642

## 2011-10-10 NOTE — ED Notes (Signed)
Pt states he has a hx of DDD. Thinks he may have reinjured his back a week ago. Has gradually gotten worse over the past week. No relief with Tramadol.

## 2011-10-11 NOTE — ED Provider Notes (Signed)
Medical screening examination/treatment/procedure(s) were performed by non-physician practitioner and as supervising physician I was immediately available for consultation/collaboration.   Richardean Canal, MD 10/11/11 [phone]

## 2011-10-19 ENCOUNTER — Other Ambulatory Visit: Payer: Self-pay | Admitting: Orthopaedic Surgery

## 2011-10-19 DIAGNOSIS — M545 Low back pain, unspecified: Secondary | ICD-10-CM

## 2011-10-19 DIAGNOSIS — M79606 Pain in leg, unspecified: Secondary | ICD-10-CM

## 2011-10-20 ENCOUNTER — Ambulatory Visit
Admission: RE | Admit: 2011-10-20 | Discharge: 2011-10-20 | Disposition: A | Discharge status: Home / Self Care | Payer: 59 | Source: Ambulatory Visit | Attending: Orthopaedic Surgery | Admitting: Orthopaedic Surgery

## 2011-10-20 DIAGNOSIS — M545 Low back pain, unspecified: Secondary | ICD-10-CM

## 2011-10-20 DIAGNOSIS — M79606 Pain in leg, unspecified: Secondary | ICD-10-CM

## 2011-11-17 ENCOUNTER — Other Ambulatory Visit: Payer: Self-pay | Admitting: Family Medicine

## 2011-11-18 NOTE — Telephone Encounter (Signed)
Call in #[phone] with 5 rf 

## 2011-12-03 ENCOUNTER — Other Ambulatory Visit: Payer: Self-pay | Admitting: Neurosurgery

## 2011-12-03 ENCOUNTER — Encounter (HOSPITAL_COMMUNITY): Payer: Self-pay | Admitting: Pharmacy Technician

## 2011-12-11 ENCOUNTER — Encounter (HOSPITAL_COMMUNITY): Payer: Self-pay

## 2011-12-11 ENCOUNTER — Encounter (HOSPITAL_COMMUNITY)
Admission: RE | Admit: 2011-12-11 | Discharge: 2011-12-11 | Disposition: A | Discharge status: Home / Self Care | Payer: 59 | Source: Ambulatory Visit | Attending: Neurosurgery | Admitting: Neurosurgery

## 2011-12-11 HISTORY — DX: Gastro-esophageal reflux disease without esophagitis: K21.9

## 2011-12-11 HISTORY — DX: Unspecified osteoarthritis, unspecified site: M19.90

## 2011-12-11 LAB — SURGICAL PCR SCREEN
MRSA, PCR: NEGATIVE
Staphylococcus aureus: NEGATIVE

## 2011-12-11 LAB — CBC
HCT: 43.2 % (ref 39.0–52.0)
Hemoglobin: 14.8 g/dL (ref 13.0–17.0)
MCH: 30 pg (ref 26.0–34.0)
MCHC: 34.3 g/dL (ref 30.0–36.0)
MCV: 87.4 fL (ref 78.0–100.0)
Platelets: 198 10*3/uL (ref 150–400)
RBC: 4.94 MIL/uL (ref 4.22–5.81)
RDW: 12.6 % (ref 11.5–15.5)
WBC: 8 10*3/uL (ref 4.0–10.5)

## 2011-12-11 LAB — TYPE AND SCREEN
ABO/RH(D): A NEG
Antibody Screen: NEGATIVE

## 2011-12-11 LAB — ABO/RH: ABO/RH(D): A NEG

## 2011-12-11 NOTE — Progress Notes (Signed)
PATIENT STATES HE HAD SLEEP STUDY 3-4 YRS AGO, "SOMEWHERE ON [address]"...BUT IT CAME OUT NEGATIVE.... PLUS HE TELLS ME THAT HE'S HAD EARLY LAST YR, SOME 'CHEST DISCOMFORT', BUT WAS WORKED UP AND IT WAS "NON-CARDIAC" RELATED.....DOES HAVE GERD AND TAKES OTC MEDS WITH RELIEF.Marland KitchenMarland KitchenDA

## 2011-12-11 NOTE — Pre-Procedure Instructions (Signed)
[address] WH[address] TROMPETER  12/11/2011   Your procedure is scheduled on: Monday, December 2nd   Report to [facility] at 9:20 AM.  Call this number if you have problems the morning of surgery: [phone]   Remember:   Do not eat food or drink any liquids:After Midnight Sunday.    Take these medicines the morning of surgery with A SIP OF WATER: Ativan, Prilosec, Percocet   Do not wear jewelry.  Do not wear lotions, powders, or colognes. You may NOT wear deodorant.   Men may shave face and neck.   Do not bring valuables to the hospital.  Contacts, dentures or bridgework may not be worn into surgery.   Leave suitcase in the car. After surgery it may be brought to your room.  For patients admitted to the hospital, checkout time is 11:00 AM the day of discharge.   Patients discharged the day of surgery will not be allowed to drive home.  Name and phone number of your driver:   Special Instructions: Shower using CHG 2 nights before surgery and the night before surgery.  If you shower the day of surgery use CHG.  Use special wash - you have one bottle of CHG for all showers.  You should use approximately 1/3 of the bottle for each shower.   Please read over the following fact sheets that you were given: Coughing and Deep Breathing, Blood Transfusion Information, MRSA Information and Surgical Site Infection Prevention

## 2011-12-20 MED ORDER — CEFAZOLIN SODIUM-DEXTROSE 2-3 GM-% IV SOLR
2.0000 g | INTRAVENOUS | Status: AC
  Administered 2011-12-21: 2 g via INTRAVENOUS
  Filled 2011-12-20: qty 50

## 2011-12-21 ENCOUNTER — Inpatient Hospital Stay (HOSPITAL_COMMUNITY): Payer: 59

## 2011-12-21 ENCOUNTER — Inpatient Hospital Stay (HOSPITAL_COMMUNITY)
Admission: RE | Admit: 2011-12-21 | Discharge: 2011-12-25 | DRG: 460 | Disposition: A | Discharge status: Home / Self Care | Payer: 59 | Source: Ambulatory Visit | Attending: Neurosurgery | Admitting: Neurosurgery

## 2011-12-21 ENCOUNTER — Inpatient Hospital Stay (HOSPITAL_COMMUNITY): Payer: 59 | Admitting: *Deleted

## 2011-12-21 ENCOUNTER — Encounter (HOSPITAL_COMMUNITY): Payer: Self-pay | Admitting: *Deleted

## 2011-12-21 ENCOUNTER — Encounter (HOSPITAL_COMMUNITY)
Admission: RE | Disposition: A | Discharge status: Home / Self Care | Payer: Self-pay | Source: Ambulatory Visit | Attending: Neurosurgery

## 2011-12-21 DIAGNOSIS — M7040 Prepatellar bursitis, unspecified knee: Secondary | ICD-10-CM | POA: Diagnosis present

## 2011-12-21 DIAGNOSIS — Z01812 Encounter for preprocedural laboratory examination: Secondary | ICD-10-CM

## 2011-12-21 DIAGNOSIS — M48062 Spinal stenosis, lumbar region with neurogenic claudication: Principal | ICD-10-CM | POA: Diagnosis present

## 2011-12-21 DIAGNOSIS — F329 Major depressive disorder, single episode, unspecified: Secondary | ICD-10-CM | POA: Diagnosis present

## 2011-12-21 DIAGNOSIS — Z01810 Encounter for preprocedural cardiovascular examination: Secondary | ICD-10-CM

## 2011-12-21 DIAGNOSIS — K219 Gastro-esophageal reflux disease without esophagitis: Secondary | ICD-10-CM | POA: Diagnosis present

## 2011-12-21 DIAGNOSIS — Z79899 Other long term (current) drug therapy: Secondary | ICD-10-CM

## 2011-12-21 DIAGNOSIS — F3289 Other specified depressive episodes: Secondary | ICD-10-CM | POA: Diagnosis present

## 2011-12-21 DIAGNOSIS — Z7982 Long term (current) use of aspirin: Secondary | ICD-10-CM

## 2011-12-21 DIAGNOSIS — M4316 Spondylolisthesis, lumbar region: Secondary | ICD-10-CM

## 2011-12-21 DIAGNOSIS — E785 Hyperlipidemia, unspecified: Secondary | ICD-10-CM | POA: Diagnosis present

## 2011-12-21 HISTORY — PX: POSTERIOR LAMINECTOMY / DECOMPRESSION LUMBAR SPINE: SUR740

## 2011-12-21 LAB — COMPREHENSIVE METABOLIC PANEL
ALT: 18 U/L (ref 0–53)
AST: 17 U/L (ref 0–37)
Albumin: 4 g/dL (ref 3.5–5.2)
Alkaline Phosphatase: 77 U/L (ref 39–117)
BUN: 15 mg/dL (ref 6–23)
CO2: 27 mEq/L (ref 19–32)
Calcium: 9.4 mg/dL (ref 8.4–10.5)
Chloride: 101 mEq/L (ref 96–112)
Creatinine, Ser: 0.93 mg/dL (ref 0.50–1.35)
GFR calc Af Amer: >90 mL/min (ref >90)
GFR calc non Af Amer: >90 mL/min (ref >90)
Glucose, Bld: 95 mg/dL (ref 70–99)
Potassium: 4 mEq/L (ref 3.5–5.1)
Sodium: 140 mEq/L (ref 135–145)
Total Bilirubin: 3 mg/dL — ABNORMAL HIGH (ref 0.3–1.2)
Total Protein: 7.3 g/dL (ref 6.0–8.3)

## 2011-12-21 SURGERY — POSTERIOR LUMBAR FUSION 1 LEVEL
Anesthesia: General | Site: Back | Wound class: Clean

## 2011-12-21 MED ORDER — MIDAZOLAM HCL 5 MG/5ML IJ SOLN
INTRAMUSCULAR | Status: DC | PRN
  Administered 2011-12-21 (×2): 1 mg via INTRAVENOUS

## 2011-12-21 MED ORDER — LORATADINE 10 MG PO TABS
10.0000 mg | ORAL_TABLET | Freq: Every day | ORAL | Status: DC
  Administered 2011-12-22 – 2011-12-25 (×4): 10 mg via ORAL
  Filled 2011-12-21 (×4): qty 1

## 2011-12-21 MED ORDER — NEOSTIGMINE METHYLSULFATE 1 MG/ML IJ SOLN
INTRAMUSCULAR | Status: DC | PRN
  Administered 2011-12-21: 4 mg via INTRAVENOUS

## 2011-12-21 MED ORDER — OXYCODONE HCL 5 MG PO TABS
ORAL_TABLET | ORAL | Status: AC
  Filled 2011-12-21: qty 1

## 2011-12-21 MED ORDER — ZOLPIDEM TARTRATE 5 MG PO TABS: 5.0000 mg | ORAL_TABLET | Freq: Every evening | ORAL | Status: DC | PRN

## 2011-12-21 MED ORDER — BUPIVACAINE-EPINEPHRINE PF 0.5-1:200000 % IJ SOLN
INTRAMUSCULAR | Status: DC | PRN
  Administered 2011-12-21: 10 mL

## 2011-12-21 MED ORDER — ATORVASTATIN CALCIUM 10 MG PO TABS
10.0000 mg | ORAL_TABLET | Freq: Every day | ORAL | Status: DC
  Administered 2011-12-21 – 2011-12-24 (×4): 10 mg via ORAL
  Filled 2011-12-21 (×5): qty 1

## 2011-12-21 MED ORDER — OXYCODONE HCL 5 MG PO TABS
5.0000 mg | ORAL_TABLET | Freq: Once | ORAL | Status: AC | PRN
  Administered 2011-12-21: 5 mg via ORAL

## 2011-12-21 MED ORDER — THROMBIN 20000 UNITS EX SOLR
CUTANEOUS | Status: DC | PRN
  Administered 2011-12-21: 14:00:00 via TOPICAL

## 2011-12-21 MED ORDER — PSEUDOEPHEDRINE HCL ER 120 MG PO TB12
120.0000 mg | ORAL_TABLET | Freq: Two times a day (BID) | ORAL | Status: DC
  Administered 2011-12-22 – 2011-12-25 (×7): 120 mg via ORAL
  Filled 2011-12-21 (×8): qty 1

## 2011-12-21 MED ORDER — HYDROMORPHONE HCL PF 1 MG/ML IJ SOLN
INTRAMUSCULAR | Status: AC
  Administered 2011-12-21: 0.5 mg via INTRAVENOUS
  Filled 2011-12-21: qty 1

## 2011-12-21 MED ORDER — OXYCODONE-ACETAMINOPHEN 5-325 MG PO TABS
1.0000 | ORAL_TABLET | ORAL | Status: DC | PRN
  Administered 2011-12-22 – 2011-12-25 (×9): 2 via ORAL
  Filled 2011-12-21 (×9): qty 2

## 2011-12-21 MED ORDER — 0.9 % SODIUM CHLORIDE (POUR BTL) OPTIME
TOPICAL | Status: DC | PRN
  Administered 2011-12-21: 1000 mL

## 2011-12-21 MED ORDER — HYDROMORPHONE HCL PF 1 MG/ML IJ SOLN
0.2500 mg | INTRAMUSCULAR | Status: DC | PRN
  Administered 2011-12-21 (×4): 0.5 mg via INTRAVENOUS

## 2011-12-21 MED ORDER — ONDANSETRON HCL 4 MG/2ML IJ SOLN: 4.0000 mg | INTRAMUSCULAR | Status: DC | PRN

## 2011-12-21 MED ORDER — PROMETHAZINE HCL 25 MG/ML IJ SOLN: 6.2500 mg | INTRAMUSCULAR | Status: DC | PRN

## 2011-12-21 MED ORDER — OXYCODONE HCL 5 MG/5ML PO SOLN: 5.0000 mg | Freq: Once | ORAL | Status: AC | PRN

## 2011-12-21 MED ORDER — SODIUM CHLORIDE 0.9 % IR SOLN
Status: DC | PRN
  Administered 2011-12-21: 14:00:00

## 2011-12-21 MED ORDER — ADULT MULTIVITAMIN W/MINERALS CH
1.0000 | ORAL_TABLET | Freq: Every day | ORAL | Status: DC
  Administered 2011-12-22 – 2011-12-25 (×4): 1 via ORAL
  Filled 2011-12-21 (×4): qty 1

## 2011-12-21 MED ORDER — SODIUM CHLORIDE 0.9 % IV SOLN
INTRAVENOUS | Status: AC
  Filled 2011-12-21: qty 500

## 2011-12-21 MED ORDER — CEFAZOLIN SODIUM-DEXTROSE 2-3 GM-% IV SOLR
2.0000 g | Freq: Four times a day (QID) | INTRAVENOUS | Status: AC
  Administered 2011-12-21 – 2011-12-22 (×2): 2 g via INTRAVENOUS
  Filled 2011-12-21 (×2): qty 50

## 2011-12-21 MED ORDER — LIDOCAINE HCL (CARDIAC) 20 MG/ML IV SOLN
INTRAVENOUS | Status: DC | PRN
  Administered 2011-12-21: 80 mg via INTRAVENOUS

## 2011-12-21 MED ORDER — BUPIVACAINE LIPOSOME 1.3 % IJ SUSP
20.0000 mL | Freq: Once | INTRAMUSCULAR | Status: DC
  Filled 2011-12-21: qty 20

## 2011-12-21 MED ORDER — ACETAMINOPHEN 650 MG RE SUPP: 650.0000 mg | RECTAL | Status: DC | PRN

## 2011-12-21 MED ORDER — SODIUM CHLORIDE 0.9 % IJ SOLN: 9.0000 mL | INTRAMUSCULAR | Status: DC | PRN

## 2011-12-21 MED ORDER — MORPHINE SULFATE (PF) 1 MG/ML IV SOLN
INTRAVENOUS | Status: AC
  Filled 2011-12-21: qty 25

## 2011-12-21 MED ORDER — DIPHENHYDRAMINE HCL 12.5 MG/5ML PO ELIX: 12.5000 mg | ORAL_SOLUTION | Freq: Four times a day (QID) | ORAL | Status: DC | PRN

## 2011-12-21 MED ORDER — ONDANSETRON HCL 4 MG/2ML IJ SOLN
INTRAMUSCULAR | Status: DC | PRN
  Administered 2011-12-21: 4 mg via INTRAVENOUS

## 2011-12-21 MED ORDER — MENTHOL 3 MG MT LOZG: 1.0000 | LOZENGE | OROMUCOSAL | Status: DC | PRN

## 2011-12-21 MED ORDER — GLYCOPYRROLATE 0.2 MG/ML IJ SOLN
INTRAMUSCULAR | Status: DC | PRN
  Administered 2011-12-21: 0.6 mg via INTRAVENOUS

## 2011-12-21 MED ORDER — HYDROCODONE-ACETAMINOPHEN 5-325 MG PO TABS
1.0000 | ORAL_TABLET | ORAL | Status: DC | PRN
  Administered 2011-12-23 – 2011-12-25 (×5): 2 via ORAL
  Filled 2011-12-21 (×5): qty 2

## 2011-12-21 MED ORDER — MORPHINE SULFATE (PF) 1 MG/ML IV SOLN
INTRAVENOUS | Status: DC
  Administered 2011-12-21: 25 mg via INTRAVENOUS
  Administered 2011-12-21: 18:00:00 via INTRAVENOUS
  Administered 2011-12-22: 5 mg via INTRAVENOUS
  Administered 2011-12-22: 15 mg via INTRAVENOUS
  Administered 2011-12-22: 22 mg via INTRAVENOUS
  Administered 2011-12-22: 06:00:00 via INTRAVENOUS
  Administered 2011-12-22: 34.85 mg via INTRAVENOUS
  Administered 2011-12-22: 2.48 mg via INTRAVENOUS
  Administered 2011-12-23: 12 mg via INTRAVENOUS
  Administered 2011-12-23: 6 mg via INTRAVENOUS
  Administered 2011-12-23: 08:00:00 via INTRAVENOUS
  Filled 2011-12-21 (×6): qty 25

## 2011-12-21 MED ORDER — ACETAMINOPHEN 325 MG PO TABS: 650.0000 mg | ORAL_TABLET | ORAL | Status: DC | PRN

## 2011-12-21 MED ORDER — BACITRACIN ZINC 500 UNIT/GM EX OINT
TOPICAL_OINTMENT | CUTANEOUS | Status: DC | PRN
  Administered 2011-12-21: 1 via TOPICAL

## 2011-12-21 MED ORDER — MEPERIDINE HCL 25 MG/ML IJ SOLN: 6.2500 mg | INTRAMUSCULAR | Status: DC | PRN

## 2011-12-21 MED ORDER — DIPHENHYDRAMINE HCL 50 MG/ML IJ SOLN: 12.5000 mg | Freq: Four times a day (QID) | INTRAMUSCULAR | Status: DC | PRN

## 2011-12-21 MED ORDER — LACTATED RINGERS IV SOLN
INTRAVENOUS | Status: DC
  Administered 2011-12-21: via INTRAVENOUS

## 2011-12-21 MED ORDER — PANTOPRAZOLE SODIUM 40 MG PO TBEC
80.0000 mg | DELAYED_RELEASE_TABLET | Freq: Every day | ORAL | Status: DC
  Administered 2011-12-22 – 2011-12-25 (×4): 80 mg via ORAL
  Filled 2011-12-21 (×2): qty 2
  Filled 2011-12-21 (×2): qty 1
  Filled 2011-12-21: qty 2

## 2011-12-21 MED ORDER — MORPHINE SULFATE (PF) 1 MG/ML IV SOLN
INTRAVENOUS | Status: AC
Start: 2011-12-21 — End: 2011-12-21
  Administered 2011-12-21
  Filled 2011-12-21: qty 25

## 2011-12-21 MED ORDER — ROCURONIUM BROMIDE 100 MG/10ML IV SOLN
INTRAVENOUS | Status: DC | PRN
  Administered 2011-12-21 (×2): 10 mg via INTRAVENOUS
  Administered 2011-12-21: 50 mg via INTRAVENOUS

## 2011-12-21 MED ORDER — BUPIVACAINE LIPOSOME 1.3 % IJ SUSP
INTRAMUSCULAR | Status: DC | PRN
  Administered 2011-12-21: 20 mL

## 2011-12-21 MED ORDER — FENTANYL CITRATE 0.05 MG/ML IJ SOLN
INTRAMUSCULAR | Status: DC | PRN
  Administered 2011-12-21 (×5): 50 ug via INTRAVENOUS
  Administered 2011-12-21: 100 ug via INTRAVENOUS
  Administered 2011-12-21: 50 ug via INTRAVENOUS
  Administered 2011-12-21: 100 ug via INTRAVENOUS

## 2011-12-21 MED ORDER — SUPER HIGH VITAMINS/MINERALS PO TABS: 1.0000 | ORAL_TABLET | Freq: Every day | ORAL | Status: DC

## 2011-12-21 MED ORDER — DOCUSATE SODIUM 100 MG PO CAPS
100.0000 mg | ORAL_CAPSULE | Freq: Two times a day (BID) | ORAL | Status: DC
  Administered 2011-12-21 – 2011-12-25 (×8): 100 mg via ORAL
  Filled 2011-12-21 (×7): qty 1

## 2011-12-21 MED ORDER — BACITRACIN 50000 UNITS IM SOLR
INTRAMUSCULAR | Status: AC
  Filled 2011-12-21: qty 1

## 2011-12-21 MED ORDER — LORATADINE-PSEUDOEPHEDRINE ER 10-240 MG PO TB24: 1.0000 | ORAL_TABLET | Freq: Every day | ORAL | Status: DC

## 2011-12-21 MED ORDER — NALOXONE HCL 0.4 MG/ML IJ SOLN: 0.4000 mg | INTRAMUSCULAR | Status: DC | PRN

## 2011-12-21 MED ORDER — DIAZEPAM 5 MG PO TABS
ORAL_TABLET | ORAL | Status: AC
  Administered 2011-12-21: 5 mg via ORAL
  Filled 2011-12-21: qty 1

## 2011-12-21 MED ORDER — PROPOFOL 10 MG/ML IV BOLUS
INTRAVENOUS | Status: DC | PRN
  Administered 2011-12-21: 180 mg via INTRAVENOUS

## 2011-12-21 MED ORDER — ONDANSETRON HCL 4 MG/2ML IJ SOLN: 4.0000 mg | Freq: Four times a day (QID) | INTRAMUSCULAR | Status: DC | PRN

## 2011-12-21 MED ORDER — DIAZEPAM 5 MG PO TABS
5.0000 mg | ORAL_TABLET | Freq: Four times a day (QID) | ORAL | Status: DC | PRN
  Administered 2011-12-21 – 2011-12-25 (×12): 5 mg via ORAL
  Filled 2011-12-21 (×11): qty 1

## 2011-12-21 MED ORDER — LACTATED RINGERS IV SOLN
INTRAVENOUS | Status: DC | PRN
  Administered 2011-12-21 (×3): via INTRAVENOUS

## 2011-12-21 MED ORDER — PHENOL 1.4 % MT LIQD: 1.0000 | OROMUCOSAL | Status: DC | PRN

## 2011-12-21 SURGICAL SUPPLY — 67 items
BAG DECANTER FOR FLEXI CONT (MISCELLANEOUS) ×2 IMPLANT
BENZOIN TINCTURE PRP APPL 2/3 (GAUZE/BANDAGES/DRESSINGS) ×2 IMPLANT
BLADE SURG ROTATE 9660 (MISCELLANEOUS) IMPLANT
BRUSH SCRUB EZ PLAIN DRY (MISCELLANEOUS) ×2 IMPLANT
BUR ACORN 6.0 (BURR) ×2 IMPLANT
BUR MATCHSTICK NEURO 3.0 LAGG (BURR) ×2 IMPLANT
CANISTER SUCTION 2500CC (MISCELLANEOUS) ×2 IMPLANT
CAP REVERE LOCKING (Cap) ×12 IMPLANT
CLOTH BEACON ORANGE TIMEOUT ST (SAFETY) ×2 IMPLANT
CONT SPEC 4OZ CLIKSEAL STRL BL (MISCELLANEOUS) ×4 IMPLANT
COVER BACK TABLE 24X17X13 BIG (DRAPES) IMPLANT
COVER TABLE BACK 60X90 (DRAPES) ×2 IMPLANT
DRAPE C-ARM 42X72 X-RAY (DRAPES) ×4 IMPLANT
DRAPE LAPAROTOMY 100X72X124 (DRAPES) ×2 IMPLANT
DRAPE POUCH INSTRU U-SHP 10X18 (DRAPES) ×2 IMPLANT
DRAPE PROXIMA HALF (DRAPES) ×2 IMPLANT
DRAPE SURG 17X23 STRL (DRAPES) ×8 IMPLANT
ELECT BLADE 4.0 EZ CLEAN MEGAD (MISCELLANEOUS) ×2
ELECT REM PT RETURN 9FT ADLT (ELECTROSURGICAL) ×2
ELECTRODE BLDE 4.0 EZ CLN MEGD (MISCELLANEOUS) ×1 IMPLANT
ELECTRODE REM PT RTRN 9FT ADLT (ELECTROSURGICAL) ×1 IMPLANT
EVACUATOR 1/8 PVC DRAIN (DRAIN) ×2 IMPLANT
GAUZE SPONGE 4X4 16PLY XRAY LF (GAUZE/BANDAGES/DRESSINGS) IMPLANT
GLOVE BIO SURGEON STRL SZ 6.5 (GLOVE) ×8 IMPLANT
GLOVE BIO SURGEON STRL SZ8.5 (GLOVE) ×4 IMPLANT
GLOVE ECLIPSE 6.5 STRL STRAW (GLOVE) ×6 IMPLANT
GLOVE EXAM NITRILE LRG STRL (GLOVE) IMPLANT
GLOVE EXAM NITRILE MD LF STRL (GLOVE) IMPLANT
GLOVE EXAM NITRILE XL STR (GLOVE) IMPLANT
GLOVE EXAM NITRILE XS STR PU (GLOVE) IMPLANT
GLOVE INDICATOR 6.5 STRL GRN (GLOVE) ×2 IMPLANT
GLOVE INDICATOR 7.5 STRL GRN (GLOVE) ×2 IMPLANT
GLOVE SS BIOGEL STRL SZ 8 (GLOVE) ×2 IMPLANT
GLOVE SUPERSENSE BIOGEL SZ 8 (GLOVE) ×2
GOWN BRE IMP SLV AUR LG STRL (GOWN DISPOSABLE) ×4 IMPLANT
GOWN BRE IMP SLV AUR XL STRL (GOWN DISPOSABLE) ×8 IMPLANT
GOWN STRL REIN 2XL LVL4 (GOWN DISPOSABLE) ×2 IMPLANT
KIT BASIN OR (CUSTOM PROCEDURE TRAY) ×2 IMPLANT
KIT ROOM TURNOVER OR (KITS) ×2 IMPLANT
NEEDLE HYPO 21X1.5 SAFETY (NEEDLE) IMPLANT
NEEDLE HYPO 25X1 1.5 SAFETY (NEEDLE) ×2 IMPLANT
NS IRRIG 1000ML POUR BTL (IV SOLUTION) ×2 IMPLANT
PACK FOAM VITOSS 10CC (Orthopedic Implant) ×2 IMPLANT
PACK LAMINECTOMY NEURO (CUSTOM PROCEDURE TRAY) ×2 IMPLANT
PAD ARMBOARD 7.5X6 YLW CONV (MISCELLANEOUS) ×6 IMPLANT
PATTIES SURGICAL .5 X1 (DISPOSABLE) IMPLANT
PUTTY 10ML ACTIFUSE ABX (Putty) ×2 IMPLANT
ROD REVERE 6.35 40MM (Rod) ×2 IMPLANT
ROD REVERE CURVED 6.35X35MM (Rod) ×2 IMPLANT
SCREW 7.5X45MM (Screw) ×4 IMPLANT
SCREW 7.5X50MM (Screw) ×4 IMPLANT
SPACER SUSTAIN O 10X26 13MM (Spacer) ×2 IMPLANT
SPONGE GAUZE 4X4 12PLY (GAUZE/BANDAGES/DRESSINGS) ×2 IMPLANT
SPONGE LAP 4X18 X RAY DECT (DISPOSABLE) IMPLANT
SPONGE NEURO XRAY DETECT 1X3 (DISPOSABLE) IMPLANT
SPONGE SURGIFOAM ABS GEL 100 (HEMOSTASIS) ×2 IMPLANT
STRIP CLOSURE SKIN 1/2X4 (GAUZE/BANDAGES/DRESSINGS) ×2 IMPLANT
SUT VIC AB 1 CT1 18XBRD ANBCTR (SUTURE) ×2 IMPLANT
SUT VIC AB 1 CT1 8-18 (SUTURE) ×2
SUT VIC AB 2-0 CP2 18 (SUTURE) ×4 IMPLANT
SYR 20CC LL (SYRINGE) IMPLANT
SYR 20ML ECCENTRIC (SYRINGE) ×4 IMPLANT
TAPE CLOTH SURG 4X10 WHT LF (GAUZE/BANDAGES/DRESSINGS) ×2 IMPLANT
TOWEL OR 17X24 6PK STRL BLUE (TOWEL DISPOSABLE) ×2 IMPLANT
TOWEL OR 17X26 10 PK STRL BLUE (TOWEL DISPOSABLE) ×2 IMPLANT
TRAY FOLEY CATH 14FRSI W/METER (CATHETERS) ×2 IMPLANT
WATER STERILE IRR 1000ML POUR (IV SOLUTION) ×2 IMPLANT

## 2011-12-21 NOTE — Anesthesia Preprocedure Evaluation (Signed)
Anesthesia Evaluation  Patient identified by MRN, date of birth, ID band Patient awake    Reviewed: Allergy & Precautions, H&P , NPO status , Patient's Chart, lab work & pertinent test results  History of Anesthesia Complications Negative for: history of anesthetic complications  Airway Mallampati: I      Dental  (+) Teeth Intact   Pulmonary neg pulmonary ROS,  breath sounds clear to auscultation        Cardiovascular negative cardio ROS  Rhythm:Regular Rate:Normal     Neuro/Psych negative neurological ROS     GI/Hepatic GERD-  ,(+) Cirrhosis -       ,   Endo/Other  negative endocrine ROS  Renal/GU negative Renal ROS     Musculoskeletal negative musculoskeletal ROS (+)   Abdominal   Peds  Hematology negative hematology ROS (+)   Anesthesia Other Findings   Reproductive/Obstetrics                           Anesthesia Physical Anesthesia Plan  ASA: I  Anesthesia Plan: General   Post-op Pain Management:    Induction: Intravenous  Airway Management Planned: Oral ETT  Additional Equipment:   Intra-op Plan:   Post-operative Plan: Extubation in OR  Informed Consent:   Dental advisory given  Plan Discussed with: CRNA and Surgeon  Anesthesia Plan Comments:         Anesthesia Quick Evaluation

## 2011-12-21 NOTE — Progress Notes (Signed)
Given PCA Bolus of morphine

## 2011-12-21 NOTE — Preoperative (Signed)
Beta Blockers   Reason not to administer Beta Blockers:Not Applicable 

## 2011-12-21 NOTE — H&P (Signed)
Subjective: The patient is a 51 year old white male who's had chronic back and left leg pain. He has failed medical management. He was worked up with a lumbar MRI and lumbar x-rays. This demonstrated a spondylolisthesis and spinal stenosis at L4-5. I discussed the various treatment options with the patient including surgery. The patient has weighed the risks, benefits, and alternatives surgery and decided proceed with an L4-5 decompression, agitation, and fusion.   Past Medical History  Diagnosis Date  . Allergy   . Depression   . Hyperlipidemia   . History of nephrolithiasis   . Chest pain, non-cardiac     normal stress test on 08-26-09   . GERD (gastroesophageal reflux disease)     TAKES MED  . Arthritis     "SOME IN BACK"    Past Surgical History  Procedure Date  . Knee surgery 83, 89, 90, 01, 02    rt knee x 5  . Knee surgery 2007    left knee meniscus repair  . Rotator cuff repair 2003  . Mri  of lumbar spine 2011    No Known Allergies  History  Substance Use Topics  . Smoking status: Never Smoker   . Smokeless tobacco: Never Used  . Alcohol Use: 1.0 oz/week    2 drink(s) per week    Family History  Problem Relation Age of Onset  . Heart attack Father   . Coronary artery disease Father   . Depression      family hx  . Hyperlipidemia      family hx  . Hypertension      family hx  . Lung cancer      family hx  . Stroke      family hx   Prior to Admission medications   Medication Sig Start Date End Date Taking? Authorizing Provider  aspirin 81 MG tablet Take 81 mg by mouth daily.     Yes Historical Provider, MD  atorvastatin (LIPITOR) 10 MG tablet Take 1 tablet (10 mg total) by mouth daily. 06/04/11 06/03/12 Yes Nelwyn Salisbury, MD  glucosamine-chondroitin 500-400 MG tablet Take 1 tablet by mouth daily.   Yes Historical Provider, MD  ibuprofen (ADVIL,MOTRIN) 800 MG tablet Take 800 mg by mouth every 8 (eight) hours as needed.   Yes Historical Provider, MD    loratadine-pseudoephedrine (CLARITIN-D 24-HOUR) 10-240 MG per 24 hr tablet Take 1 tablet by mouth daily.     Yes Historical Provider, MD  LORazepam (ATIVAN) 0.5 MG tablet Take 0.5 mg by mouth every 8 (eight) hours as needed. For anxiety   Yes Historical Provider, MD  Multiple Vitamins-Minerals (MULTIVITAMIN,TX-MINERALS) tablet Take 1 tablet by mouth daily.     Yes Historical Provider, MD  omeprazole (PRILOSEC) 40 MG capsule Take 40 mg by mouth daily as needed. For acid reflux 10/06/11  Yes Nelwyn Salisbury, MD  oxyCODONE-acetaminophen (PERCOCET/ROXICET) 5-325 MG per tablet Take 1 tablet by mouth every 4 (four) hours as needed. For pain 10/10/11  Yes Evalina Field, PA  sildenafil (VIAGRA) 100 MG tablet Take 100 mg by mouth daily as needed. For erectile dysfunction 04/23/11  Yes Baker Pierini, FNP  traMADol (ULTRAM) 50 MG tablet Take 50 mg by mouth every 6 (six) hours as needed.   Yes Historical Provider, MD     Review of Systems  Positive ROS: As above  All other systems have been reviewed and were otherwise negative with the exception of those mentioned in the HPI and as above.  Objective: Vital signs in last 24 hours: Temp:  [98.8 F (37.1 C)] 98.8 F (37.1 C) (12/02 0843) Pulse Rate:  [93] 93  (12/02 0843) Resp:  [18] 18  (12/02 0843) BP: (127)/(79) 127/79 mmHg (12/02 0843) SpO2:  [97 %] 97 % (12/02 0843)  General Appearance: Alert, cooperative, no distress, appears stated age Head: Normocephalic, without obvious abnormality, atraumatic Eyes: PERRL, conjunctiva/corneas clear, EOM's intact, fundi benign, both eyes      Ears: Normal TM's and external ear canals, both ears Throat: Lips, mucosa, and tongue normal; teeth and gums normal Neck: Supple, symmetrical, trachea midline, no adenopathy; thyroid: No enlargement/tenderness/nodules; no carotid bruit or JVD Back: Symmetric, no curvature, ROM normal, no CVA tenderness Lungs: Clear to auscultation bilaterally, respirations  unlabored Heart: Regular rate and rhythm, S1 and S2 normal, no murmur, rub or gallop Abdomen: Soft, non-tender, bowel sounds active all four quadrants, no masses, no organomegaly Extremities: Extremities normal, atraumatic, no cyanosis or edema Pulses: 2+ and symmetric all extremities Skin: Skin color, texture, turgor normal, no rashes or lesions  NEUROLOGIC:   Mental status: alert and oriented, no aphasia, good attention span, Fund of knowledge/ memory ok Motor Exam - grossly normal except the patient left extensor hallucis longus/dorsi flexors are 4+ over 5. Sensory Exam - grossly normal Reflexes: Symmetric Coordination - grossly normal Gait - grossly normal Balance - grossly normal Cranial Nerves: I: smell Not tested  II: visual acuity  OS: Normal    OD: Normal   II: visual fields Full to confrontation  II: pupils Equal, round, reactive to light  III,VII: ptosis None  III,IV,VI: extraocular muscles  Full ROM  V: mastication Normal  V: facial light touch sensation  Normal  V,VII: corneal reflex  Present  VII: facial muscle function - upper  Normal  VII: facial muscle function - lower Normal  VIII: hearing Not tested  IX: soft palate elevation  Normal  IX,X: gag reflex Present  XI: trapezius strength  5/5  XI: sternocleidomastoid strength 5/5  XI: neck flexion strength  5/5  XII: tongue strength  Normal    Data Review Lab Results  Component Value Date   WBC 8.0 12/11/2011   HGB 14.8 12/11/2011   HCT 43.2 12/11/2011   MCV 87.4 12/11/2011   PLT 198 12/11/2011   Lab Results  Component Value Date   NA 140 12/21/2011   K 4.0 12/21/2011   CL 101 12/21/2011   CO2 27 12/21/2011   BUN 15 12/21/2011   CREATININE 0.93 12/21/2011   GLUCOSE 95 12/21/2011   No results found for this basename: INR, PROTIME    Assessment/Plan: L4-5 spondylolisthesis, spinal stenosis, lumbar radiculopathy, lumbago: I have discussed situation with the patient. I reviewed his MR scan with them and  pointed out the abnormalities. We have discussed the various treatment options including surgery. I have described the surgical option of an L4-5 decompressive laminectomy, posterior lumbar fusion, placement in by prosthesis, a posterior agitation the pedicles rods in the posterior lateral arthrodesis. I described the surgery to him. I've shown surgical models. I discussed the risks, benefits, alternatives, and likelihood of achieving our goals with surgery. I've answered all the patient's questions. He has decided to proceed with surgery.   Kery Haltiwanger D 12/21/2011 12:03 PM

## 2011-12-21 NOTE — Anesthesia Postprocedure Evaluation (Signed)
  Anesthesia Post-op Note  Patient: Dale [name]  Procedure(s) Performed: Procedure(s) (LRB) with comments: POSTERIOR LUMBAR FUSION 1 LEVEL (N/A) - Lumbar four-five laminectomy with posterior lumbar interbody fusion with interbody prothesis posterolateral arthrodesis and posterior nonsegmental instrumentation  Patient Location: PACU  Anesthesia Type:General  Level of Consciousness: awake  Airway and Oxygen Therapy: Patient Spontanous Breathing  Post-op Pain: mild  Post-op Assessment: Post-op Vital signs reviewed  Post-op Vital Signs: stable  Complications: No apparent anesthesia complications

## 2011-12-21 NOTE — Progress Notes (Signed)
Patient ID: CORWIN KUIKEN, male   DOB: [birth year], 51 y.o.   MRN: [id number] Subjective:  The patient is alert and pleasant. He is in no apparent distress. He looks well.  Objective: Vital signs in last 24 hours: Temp:  [98.8 F (37.1 C)] 98.8 F (37.1 C) (12/02 0843) Pulse Rate:  [93] 93  (12/02 0843) Resp:  [18] 18  (12/02 0843) BP: (127)/(79) 127/79 mmHg (12/02 0843) SpO2:  [97 %] 97 % (12/02 0843)  Intake/Output from previous day:   Intake/Output this shift: Total I/O In: 2500 [I.V.:2500] Out: 415 [Urine:215; Blood:200]  Physical exam patient is moving his lower extremities well.  Lab Results: No results found for this basename: WBC:2,HGB:2,HCT:2,PLT:2 in the last 72 hours BMET  [facility] 12/21/11 0951  NA 140  K 4.0  CL 101  CO2 27  GLUCOSE 95  BUN 15  CREATININE 0.93  CALCIUM 9.4    Studies/Results: Dg Lumbar Spine 2-3 Views  12/21/2011  *RADIOLOGY REPORT*  Clinical Data: L4-5 fusion.  DG C-ARM 1-60 MIN,LUMBAR SPINE - 2-3 VIEW  Technique: Two intraoperative C-arm views submitted for review after surgery.  Comparison:  12/21/2011 intraoperative plain film exam and 10/20/2011 preoperative MR.  Findings: L4-5 fusion utilizing pedicle screws and interbody spacer.  Decrease in anterior slip of L4.  This can be assessed on follow-up.  IMPRESSION: Fusion L4-5.   Original Report Authenticated By: Lacy Duverney, M.D.    Dg Lumbar Spine 1 View  12/21/2011  *RADIOLOGY REPORT*  Clinical Data: L4-5 fusion.  LUMBAR SPINE - 1 VIEW  Comparison: 10/20/2011 MR.  Findings: Single intraoperative lateral view of the lumbar spine submitted for review after surgery.  This reveals metallic probe at the L4-5 level.  Surgical rakes posterior to the L4 vertebra. Surgical sponge in place.  IMPRESSION: Localization L4-5.   Original Report Authenticated By: Lacy Duverney, M.D.    Dg C-arm 1-60 Min  12/21/2011  *RADIOLOGY REPORT*  Clinical Data: L4-5 fusion.  DG C-ARM 1-60 MIN,LUMBAR SPINE - 2-3 VIEW   Technique: Two intraoperative C-arm views submitted for review after surgery.  Comparison:  12/21/2011 intraoperative plain film exam and 10/20/2011 preoperative MR.  Findings: L4-5 fusion utilizing pedicle screws and interbody spacer.  Decrease in anterior slip of L4.  This can be assessed on follow-up.  IMPRESSION: Fusion L4-5.   Original Report Authenticated By: Lacy Duverney, M.D.     Assessment/Plan: The patient is doing well.  LOS: 0 days     Aubryanna Nesheim D 12/21/2011, 5:10 PM

## 2011-12-21 NOTE — Transfer of Care (Signed)
Immediate Anesthesia Transfer of Care Note  Patient: Dale [name]  Procedure(s) Performed: Procedure(s) (LRB) with comments: POSTERIOR LUMBAR FUSION 1 LEVEL (N/A) - Lumbar four-five laminectomy with posterior lumbar interbody fusion with interbody prothesis posterolateral arthrodesis and posterior nonsegmental instrumentation  Patient Location: PACU  Anesthesia Type:General  Level of Consciousness: awake, patient cooperative and responds to stimulation  Airway & Oxygen Therapy: Patient Spontanous Breathing and Patient connected to nasal cannula oxygen  Post-op Assessment: Report given to PACU RN and Post -op Vital signs reviewed and stable  Post vital signs: Reviewed and stable  Complications: No apparent anesthesia complications

## 2011-12-21 NOTE — Progress Notes (Signed)
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

## 2011-12-21 NOTE — Progress Notes (Signed)
1840 given another morphine PCa bolus

## 2011-12-21 NOTE — Op Note (Signed)
Brief history: The patient is a 51 year old white male who has suffered from chronic back and left leg pain consistent with a lumbar radiculopathy. He has failed medical management and was worked up with a lumbar MRI and lumbar x-rays. This demonstrated a spondylolisthesis and spinal stenosis at L4-5. I discussed the various treatment options with the patient including surgery. He has weighed the risks, benefits, and alternatives surgery and decided proceed with the L4-5 decompressive laminectomy, fusion and instrumentation.  Preoperative diagnosis: L4-5 spondylolisthesis, Degenerative disc disease, spinal stenosis compressing both the L4 and the L5 nerve roots; lumbago; lumbar radiculopathy  Postoperative diagnosis: The same  Procedure: Bilateral L4 Laminotomy/foraminotomies to decompress the bilateral L4 and L5 nerve roots(the work required to do this was in addition to the work required to do the posterior lumbar interbody fusion because of the patient's spinal stenosis, facet arthropathy. Etc. requiring a wide decompression of the nerve roots.); L4-5 posterior lumbar interbody fusion with local morselized autograft bone and Actifusebone graft extender; insertion of interbody prosthesis at L4-5 (globus peek interbody prosthesis); posterior nonsegmental instrumentation from L4 to L5 with globus titanium pedicle screws and rods; posterior lateral arthrodesis at L4-5 with local morselized autograft bone and Vitoss bone graft extender.  Surgeon: Dr. Delma Officer  Asst.: Dr. Barbaraann Barthel  Anesthesia: Gen. endotracheal  Estimated blood loss: 200 cc  Drains: None  Locations: None  Description of procedure: The patient was brought to the operating room by the anesthesia team. General endotracheal anesthesia was induced. The patient was turned to the prone position on the Wilson frame. The patient's lumbosacral region was then prepared with Betadine scrub and Betadine solution. Sterile drapes were  applied.  I then injected the area to be incised with Marcaine with epinephrine solution. I then used the scalpel to make a linear midline incision over the L4-5 interspace. I then used electrocautery to perform a bilateral subperiosteal dissection exposing the spinous process and lamina of L3, L4 and L5. We then obtained intraoperative radiograph to confirm our location. We then inserted the Verstrac retractor to provide exposure.  I began the decompression by using the high speed drill to perform laminotomies at L4-5. We then used the Kerrison punches to widen the laminotomy and removed the ligamentum flavum at L4-5. We used the Kerrison punches to remove the medial facets at L4-5. We performed wide foraminotomies about the bilateral L4 and L5 nerve roots completing the decompression.  We now turned our attention to the posterior lumbar interbody fusion. I used a scalpel to incise the intervertebral disc at L4-5. I then performed a partial intervertebral discectomy at L4-5 using the pituitary forceps. We prepared the vertebral endplates at L4-5 for the fusion by removing the soft tissues with the curettes. We then used the trial spacers to pick the appropriate sized interbody prosthesis. We prefilled his prosthesis with a combination of local morselized autograft bone that we obtained during the decompression as well as Actifuse bone graft extender. We inserted the prefilled prosthesis into the interspace at L4-5. There was a good snug fit of the prosthesis in the interspace. We then filled and the remainder of the intervertebral disc space with local morselized autograft bone and Actifuse. This completed the posterior lumbar interbody arthrodesis.  We now turned attention to the instrumentation. Under fluoroscopic guidance we cannulated the bilateral L4 and L5 pedicles with the bone probe. We then removed the bone probe. We then tapped the pedicle with a 6.5 millimeter tap. We then removed the tap. We  probed inside the tapped pedicle with a ball probe to rule out cortical breaches. We then inserted a 7.5 x 50 and 45 millimeter pedicle screw into the L4 and L5 pedicles bilaterally under fluoroscopic guidance. We then palpated along the medial aspect of the pedicles to rule out cortical breaches. There were none. The nerve roots were not injured. We then connected the unilateral pedicle screws with a lordotic rod. We compressed the construct and secured the rod in place with the caps. We then tightened the caps appropriately. This completed the instrumentation from L4-5.  We now turned our attention to the posterior lateral arthrodesis at L4-5. We used the high-speed drill to decorticate the remainder of the facets, pars, transverse process at L4-5. We then applied a combination of local morselized autograft bone and Vitoss bone graft extender over these decorticated posterior lateral structures. This completed the posterior lateral arthrodesis.  We then obtained hemostasis using bipolar electrocautery. We irrigated the wound out with bacitracin solution. We inspected the thecal sac and nerve roots and noted they were well decompressed. We then removed the retractor. We reapproximated patient's thoracolumbar fascia with interrupted #1 Vicryl suture. We reapproximated patient's subcutaneous tissue with interrupted 2-0 Vicryl suture. The reapproximated patient's skin with Steri-Strips and benzoin. The wound was then coated with bacitracin ointment. A sterile dressing was applied. The drapes were removed. The patient was subsequently returned to the supine position where they were extubated by the anesthesia team. He was then transported to the post anesthesia care unit in stable condition. All sponge instrument and needle counts were reportedly correct at the end of this case.

## 2011-12-22 LAB — CBC
HCT: 37.5 % — ABNORMAL LOW (ref 39.0–52.0)
Hemoglobin: 12.5 g/dL — ABNORMAL LOW (ref 13.0–17.0)
MCH: 30.3 pg (ref 26.0–34.0)
MCHC: 33.3 g/dL (ref 30.0–36.0)
MCV: 90.8 fL (ref 78.0–100.0)
Platelets: 157 10*3/uL (ref 150–400)
RBC: 4.13 MIL/uL — ABNORMAL LOW (ref 4.22–5.81)
RDW: 12.9 % (ref 11.5–15.5)
WBC: 13.1 10*3/uL — ABNORMAL HIGH (ref 4.0–10.5)

## 2011-12-22 LAB — BASIC METABOLIC PANEL
BUN: 10 mg/dL (ref 6–23)
CO2: 32 mEq/L (ref 19–32)
Calcium: 8.6 mg/dL (ref 8.4–10.5)
Chloride: 96 mEq/L (ref 96–112)
Creatinine, Ser: 0.99 mg/dL (ref 0.50–1.35)
GFR calc Af Amer: >90 mL/min (ref >90)
GFR calc non Af Amer: >90 mL/min (ref >90)
Glucose, Bld: 115 mg/dL — ABNORMAL HIGH (ref 70–99)
Potassium: 4.4 mEq/L (ref 3.5–5.1)
Sodium: 135 mEq/L (ref 135–145)

## 2011-12-22 NOTE — Clinical Social Work Note (Signed)
Clinical Social Work   CSW received consult for SNF. CSW reviewed chart and discussed pt with RN during progresion. Awaiting for PT evals for discharge recommedations. CSW will assess for SNF, if appropriate. CSW will continue to follow.   Dede Query, MSW, Theresia Majors [phone]

## 2011-12-22 NOTE — Evaluation (Signed)
Physical Therapy Evaluation Patient Details Name: Dale [name] MRN: [id number] DOB: 1960-10-08 Today's Date: 12/22/2011 Time: 8469-6295 PT Time Calculation (min): 32 min  PT Assessment / Plan / Recommendation Clinical Impression  Pt is a 51 y.o. male s/p lumbar fusion surgery. Pt has limitations in functional mobility secondary to pain, weakness and decreased activity tolerance. Pt was educated on back precautions for all aspects of mobility.  Strength assessment showed no significant deficits at this time.  Feel patient will benefit from continued skilled PT to address deficits and maximize independence for discharge home. Pt will need to perform stair negotiation prior to discharge as patient has 7 steps to enter home and 13 steps to bed and bath.     PT Assessment  Patient needs continued PT services    Follow Up Recommendations  No PT follow up    Does the patient have the potential to tolerate intense rehabilitation      Barriers to Discharge Inaccessible home environment;Decreased caregiver support (Pt borther is available intermittantly; steps to navigate)      Equipment Recommendations  Rolling walker with 5" wheels    Recommendations for Other Services     Frequency Min 4X/week    Precautions / Restrictions Precautions Precautions: Back Precaution Comments: hand otu provided; patient educated on importance of maintaining precautions Required Braces or Orthoses: Spinal Brace   Pertinent Vitals/Pain 6/10 prior to activity; 8/10 during      Mobility  Bed Mobility Bed Mobility: Rolling Left;Left Sidelying to Sit Rolling Left: 5: Supervision Left Sidelying to Sit: 5: Supervision Details for Bed Mobility Assistance: VC's for technique and precautions Transfers Transfers: Sit to Stand;Stand to Sit Sit to Stand: 4: Min guard;From bed Stand to Sit: 4: Min guard;To chair/3-in-1;With armrests Ambulation/Gait Ambulation/Gait Assistance: 4: Min guard Ambulation Distance  (Feet): 40 Feet Assistive device: Other (Comment) (IV pole) Ambulation/Gait Assistance Details: Min guard for stability secondary to increased pain with ambulation Gait Pattern: Step-to pattern;Decreased stride length;Shuffle (very guarded) Gait velocity: decreased General Gait Details: Pt very guarded with ambulation Stairs: No    Shoulder Instructions     Exercises     PT Diagnosis: Difficulty walking;Abnormality of gait;Generalized weakness;Acute pain  PT Problem List: Decreased strength;Decreased activity tolerance;Decreased mobility;Pain;Decreased knowledge of precautions;Decreased knowledge of use of DME PT Treatment Interventions: DME instruction;Gait training;Functional mobility training;Stair training;Therapeutic activities;Therapeutic exercise;Balance training;Patient/family education   PT Goals Acute Rehab PT Goals PT Goal Formulation: With patient Time For Goal Achievement: 12/22/11 Potential to Achieve Goals: Good Pt will go Supine/Side to Sit: with modified independence PT Goal: Supine/Side to Sit - Progress: Goal set today Pt will Stand: with modified independence PT Goal: Stand - Progress: Goal set today Pt will Ambulate: >150 feet;with modified independence;with rolling walker PT Goal: Ambulate - Progress: Goal set today Pt will Go Up / Down Stairs: Flight;with modified independence PT Goal: Up/Down Stairs - Progress: Goal set today  Visit Information  Last PT Received On: 12/22/11 Assistance Needed: +1    Subjective Data  Subjective: I'm in a lot of pain, this is rough Patient Stated Goal: to go home   Prior Functioning  Home Living Lives With: Alone Available Help at Discharge: Family;Other (Comment) (brother next door) Type of Home: House Home Access: Stairs to enter [employer] of Steps: 7 Entrance Stairs-Rails: Right;Left Home Layout: Two level;Able to live on main level with bedroom/bathroom Alternate Level Stairs-Number of Steps:  13 Bathroom Shower/Tub: Health visitor: Standard Home Adaptive Equipment: Built-in shower seat Prior Function  Level of Independence: Independent Able to Take Stairs?: Yes Driving: Yes 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 for tasks performed    Extremity/Trunk Assessment Right Upper Extremity Assessment RUE ROM/Strength/Tone: [facility] for tasks assessed Left Upper Extremity Assessment LUE ROM/Strength/Tone: WFL for tasks assessed Right Lower Extremity Assessment RLE ROM/Strength/Tone: [facility] for tasks assessed Left Lower Extremity Assessment LLE ROM/Strength/Tone: WFL for tasks assessed LLE Sensation: Deficits (middle toes) Trunk Assessment Trunk Assessment: Normal   Balance Balance Balance Assessed: Yes High Level Balance High Level Balance Activites: Backward walking;Direction changes;Turns;Head turns High Level Balance Comments: pt guarded with all aspects of balance secondary to increaseed pain  End of Session PT - End of Session Equipment Utilized During Treatment: Gait belt;Back brace Activity Tolerance: Patient limited by pain Patient left: in chair;with call bell/phone within reach Nurse Communication: Mobility status  GP     Fabio Asa 12/22/2011, 11:45 AM  Charlotte Crumb, PT DPT  [phone]

## 2011-12-22 NOTE — Progress Notes (Signed)
Utilization review complete 

## 2011-12-22 NOTE — Clinical Social Work Note (Signed)
Clinical Social Work   CSW reviewed chart. PT is not recommending any follow up. CSW is signing off as no further needs identified. Please reconsult if a need arises prior to discharge.   Dede Query, MSW, Theresia Majors [phone]

## 2011-12-22 NOTE — Evaluation (Signed)
Occupational Therapy Evaluation Patient Details Name: Dale [name] MRN: [id number] DOB: 07/08/1960 Today's Date: 12/22/2011 Time: 1011-1040    OT Assessment / Plan / Recommendation Clinical Impression  Pt presents to OT s/p lumbar fusion. Pt with decreased I with ADL activity and will benefit from skilled OT to increase I with ADL activity and return to PLOF while following back precautions    OT Assessment  Patient needs continued OT Services    Follow Up Recommendations  No OT follow up       Equipment Recommendations  None recommended by OT       Frequency  Min 3X/week    Precautions / Restrictions Precautions Precautions: Back Precaution Comments: hand otu provided; patient educated on importance of maintaining precautions Required Braces or Orthoses: Spinal Brace       ADL  Grooming: Simulated;Set up Where Assessed - Grooming: Unsupported sitting Upper Body Bathing: Simulated;Minimal assistance Where Assessed - Upper Body Bathing: Unsupported sitting Lower Body Bathing: Simulated;Moderate assistance Where Assessed - Lower Body Bathing: Unsupported sit to stand Upper Body Dressing: Other (comment);Moderate assistance (brace) Where Assessed - Upper Body Dressing: Unsupported sitting Lower Body Dressing: Simulated;Maximal assistance Where Assessed - Lower Body Dressing: Unsupported sit to stand Toilet Transfer: Simulated;Minimal assistance;Other (comment) (bed to chair) Toilet Transfer Method: Sit to stand Toileting - Clothing Manipulation and Hygiene: Simulated;Minimal assistance Where Assessed - Toileting Clothing Manipulation and Hygiene: Standing    OT Diagnosis: Generalized weakness;Acute pain  OT Problem List: Decreased strength;Pain;Decreased knowledge of precautions OT Treatment Interventions: Self-care/ADL training;Patient/family education;DME and/or AE instruction   OT Goals Acute Rehab OT Goals OT Goal Formulation: With patient Time For Goal  Achievement: 01/05/12 Potential to Achieve Goals: Good ADL Goals Pt Will Perform Grooming: with supervision;Standing at sink ADL Goal: Grooming - Progress: Goal set today Pt Will Perform Lower Body Dressing: with adaptive equipment;with supervision;Sit to stand from chair ADL Goal: Lower Body Dressing - Progress: Goal set today Pt Will Transfer to Toilet: with supervision;Comfort height toilet;Maintaining back safety precautions;Ambulation ADL Goal: Toilet Transfer - Progress: Goal set today Pt Will Perform Toileting - Clothing Manipulation: with supervision;Standing ADL Goal: Toileting - Clothing Manipulation - Progress: Goal set today Pt Will Perform Tub/Shower Transfer: Shower transfer;with supervision;Maintaining back safety precautions ADL Goal: Tub/Shower Transfer - Progress: Goal set today  Visit Information  Last OT Received On: 12/22/11 Assistance Needed: +1       Prior Functioning     Home Living Lives With: Alone Available Help at Discharge: Family;Other (Comment) (brother next door) Type of Home: House Home Access: Stairs to enter [employer] of Steps: 7 Entrance Stairs-Rails: Right;Left Home Layout: Two level;Able to live on main level with bedroom/bathroom Alternate Level Stairs-Number of Steps: 13 Bathroom Shower/Tub: Health visitor: Standard Home Adaptive Equipment: Built-in shower seat Prior Function Level of Independence: Independent Able to Take Stairs?: Yes Driving: Yes 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[employer] Communication: No difficulties Dominant Hand: Right            Cognition  Overall Cognitive Status: Appears within functional limits for tasks assessed/performed Arousal/Alertness: Awake/alert Orientation Level: Appears intact for tasks assessed Behavior During Session: [facility] for tasks performed    Extremity/Trunk Assessment Right Upper Extremity  Assessment RUE ROM/Strength/Tone: [facility] for tasks assessed Left Upper Extremity Assessment LUE ROM/Strength/Tone: [facility] for tasks assessed Right Lower Extremity Assessment RLE ROM/Strength/Tone: [facility] for tasks assessed Left Lower Extremity Assessment LLE ROM/Strength/Tone: [facility] for tasks assessed LLE  Sensation: Deficits (middle toes) Trunk Assessment Trunk Assessment: Normal     Mobility Bed Mobility Bed Mobility: Rolling Left;Left Sidelying to Sit Rolling Left: 5: Supervision Left Sidelying to Sit: 5: Supervision Details for Bed Mobility Assistance: VC's for technique and precautions Transfers Transfers: Sit to Stand;Stand to Sit Sit to Stand: 4: Min guard;From bed Stand to Sit: 4: Min guard;To chair/3-in-1;With armrests           Balance Balance Balance Assessed: Yes High Level Balance High Level Balance Activites: Backward walking;Direction changes;Turns;Head turns High Level Balance Comments: pt guarded with all aspects of balance secondary to increaseed pain   End of Session OT - End of Session Equipment Utilized During Treatment: Back brace Activity Tolerance: Patient limited by pain Patient left: in chair;with call bell/phone within reach  GO     Ariaunna Longsworth, [place] Kung 12/22/2011, 11:53 AM

## 2011-12-22 NOTE — Progress Notes (Signed)
Patient ID: Dale [name], male   DOB: [birth year], 51 y.o.   MRN: [id number] Subjective:  The patient is alert and pleasant. He looks well.  Objective: Vital signs in last 24 hours: Temp:  [98.2 F (36.8 C)-99 F (37.2 C)] 98.2 F (36.8 C) (12/03 0229) Pulse Rate:  [90-104] 102  (12/03 0229) Resp:  [10-27] 18  (12/03 0533) BP: (117-139)/(69-99) 138/70 mmHg (12/03 0229) SpO2:  [97 %-100 %] 100 % (12/03 0533) Weight:  [83.915 kg (185 lb)] 83.915 kg (185 lb) (12/02 2008)  Intake/Output from previous day: 12/02 0701 - 12/03 0700 In: 2500 [I.V.:2500] Out: 4490 [Urine:4115; Drains:175; Blood:200] Intake/Output this shift:    Physical exam the patient is alert and oriented x3. His lower extremity strength is grossly normal.  Lab Results:  [facility] 12/22/11 0615  WBC 13.1*  HGB 12.5*  HCT 37.5*  PLT 157   BMET  Basename 12/22/11 0615 12/21/11 0951  NA 135 140  K 4.4 4.0  CL 96 101  CO2 32 27  GLUCOSE 115* 95  BUN 10 15  CREATININE 0.99 0.93  CALCIUM 8.6 9.4    Studies/Results: Dg Lumbar Spine 2-3 Views  12/21/2011  *RADIOLOGY REPORT*  Clinical Data: L4-5 fusion.  DG C-ARM 1-60 MIN,LUMBAR SPINE - 2-3 VIEW  Technique: Two intraoperative C-arm views submitted for review after surgery.  Comparison:  12/21/2011 intraoperative plain film exam and 10/20/2011 preoperative MR.  Findings: L4-5 fusion utilizing pedicle screws and interbody spacer.  Decrease in anterior slip of L4.  This can be assessed on follow-up.  IMPRESSION: Fusion L4-5.   Original Report Authenticated By: Lacy Duverney, M.D.    Dg Lumbar Spine 1 View  12/21/2011  *RADIOLOGY REPORT*  Clinical Data: L4-5 fusion.  LUMBAR SPINE - 1 VIEW  Comparison: 10/20/2011 MR.  Findings: Single intraoperative lateral view of the lumbar spine submitted for review after surgery.  This reveals metallic probe at the L4-5 level.  Surgical rakes posterior to the L4 vertebra. Surgical sponge in place.  IMPRESSION: Localization L4-5.    Original Report Authenticated By: Lacy Duverney, M.D.    Dg C-arm 1-60 Min  12/21/2011  *RADIOLOGY REPORT*  Clinical Data: L4-5 fusion.  DG C-ARM 1-60 MIN,LUMBAR SPINE - 2-3 VIEW  Technique: Two intraoperative C-arm views submitted for review after surgery.  Comparison:  12/21/2011 intraoperative plain film exam and 10/20/2011 preoperative MR.  Findings: L4-5 fusion utilizing pedicle screws and interbody spacer.  Decrease in anterior slip of L4.  This can be assessed on follow-up.  IMPRESSION: Fusion L4-5.   Original Report Authenticated By: Lacy Duverney, M.D.     Assessment/Plan: Postop day 1: The patient is doing well. We will mobilize him with PT and OT. I will plan to discontinue the PCA pump and the drain tomorrow.  LOS: 1 day     Ilissa Rosner D 12/22/2011, 7:47 AM      2

## 2011-12-22 NOTE — Plan of Care (Signed)
Problem: Phase I Progression Outcomes Goal: Pain controlled with appropriate interventions Outcome: Not Met (add Reason) Pt care of lots of pain ice pack given adjuvant to pca morphine and pain medication

## 2011-12-23 MED ORDER — MORPHINE SULFATE 2 MG/ML IJ SOLN
2.0000 mg | INTRAMUSCULAR | Status: DC | PRN
  Filled 2011-12-23: qty 2

## 2011-12-23 MED FILL — Sodium Chloride Irrigation Soln 0.9%: Qty: 3000 | Status: AC

## 2011-12-23 MED FILL — Heparin Sodium (Porcine) Inj 1000 Unit/ML: INTRAMUSCULAR | Qty: 30 | Status: AC

## 2011-12-23 MED FILL — Sodium Chloride IV Soln 0.9%: INTRAVENOUS | Qty: 1000 | Status: AC

## 2011-12-23 NOTE — Progress Notes (Signed)
Patient ID: KAY RICCIUTI, male   DOB: October 08, 1960, 51 y.o.   MRN: [id number] Subjective:  The patient is alert and pleasant. He complains of some right knee pain. He has had surgery and prior aspirations by Dr. Annell Greening in the past.  Objective: Vital signs in last 24 hours: Temp:  [99 F (37.2 C)-101.3 F (38.5 C)] 99.7 F (37.6 C) (12/04 0554) Pulse Rate:  [102-117] 104  (12/04 0554) Resp:  [10-20] 15  (12/04 0554) BP: (119-128)/(69-81) 119/70 mmHg (12/04 0554) SpO2:  [92 %-97 %] 96 % (12/04 0554) FiO2 (%):  [51 %] 51 % (12/03 1612)  Intake/Output from previous day: 12/03 0701 - 12/04 0700 In: 1200 [P.O.:1200] Out: 1380 [Urine:1300; Drains:80] Intake/Output this shift:    Physical exam the patient is alert and pleasant. His strength is normal. Examination of his knee demonstrates no obvious abnormalities. His knee incision is well-healed.  Lab Results:  [facility] 12/22/11 0615  WBC 13.1*  HGB 12.5*  HCT 37.5*  PLT 157   BMET  Basename 12/22/11 0615 12/21/11 0951  NA 135 140  K 4.4 4.0  CL 96 101  CO2 32 27  GLUCOSE 115* 95  BUN 10 15  CREATININE 0.99 0.93  CALCIUM 8.6 9.4    Studies/Results: Dg Lumbar Spine 2-3 Views  12/21/2011  *RADIOLOGY REPORT*  Clinical Data: L4-5 fusion.  DG C-ARM 1-60 MIN,LUMBAR SPINE - 2-3 VIEW  Technique: Two intraoperative C-arm views submitted for review after surgery.  Comparison:  12/21/2011 intraoperative plain film exam and 10/20/2011 preoperative MR.  Findings: L4-5 fusion utilizing pedicle screws and interbody spacer.  Decrease in anterior slip of L4.  This can be assessed on follow-up.  IMPRESSION: Fusion L4-5.   Original Report Authenticated By: Lacy Duverney, M.D.    Dg Lumbar Spine 1 View  12/21/2011  *RADIOLOGY REPORT*  Clinical Data: L4-5 fusion.  LUMBAR SPINE - 1 VIEW  Comparison: 10/20/2011 MR.  Findings: Single intraoperative lateral view of the lumbar spine submitted for review after surgery.  This reveals metallic probe at  the L4-5 level.  Surgical rakes posterior to the L4 vertebra. Surgical sponge in place.  IMPRESSION: Localization L4-5.   Original Report Authenticated By: Lacy Duverney, M.D.    Dg C-arm 1-60 Min  12/21/2011  *RADIOLOGY REPORT*  Clinical Data: L4-5 fusion.  DG C-ARM 1-60 MIN,LUMBAR SPINE - 2-3 VIEW  Technique: Two intraoperative C-arm views submitted for review after surgery.  Comparison:  12/21/2011 intraoperative plain film exam and 10/20/2011 preoperative MR.  Findings: L4-5 fusion utilizing pedicle screws and interbody spacer.  Decrease in anterior slip of L4.  This can be assessed on follow-up.  IMPRESSION: Fusion L4-5.   Original Report Authenticated By: Lacy Duverney, M.D.     Assessment/Plan: Postop day #2: I will discontinue the PCA pump. We will continue to mobilize him. He will probably go home tomorrow.  LOS: 2 days     Trafton Roker D 12/23/2011, 10:23 AM

## 2011-12-23 NOTE — Progress Notes (Signed)
Physical Therapy Treatment Patient Details Name: Dale [name] MRN: [id number] DOB: 1960/12/27 Today's Date: 12/23/2011 Time: 9562-1308 PT Time Calculation (min): 30 min  PT Assessment / Plan / Recommendation Comments on Treatment Session  Pt. reporting of increased pain with todays session and limited due to pain being in his back (around surgical site) and Rt knee. Pt. unable to demonstrate full Rt knee extension and using his UEs on RW to offload and (A) with the limited ambulation he was able to do in todays session. RN notified of pain and administered medications during session.    Follow Up Recommendations  No PT follow up     Does the patient have the potential to tolerate intense rehabilitation     Barriers to Discharge        Equipment Recommendations  None recommended by OT    Recommendations for Other Services    Frequency Min 4X/week   Plan      Precautions / Restrictions Precautions Precautions: Back Precaution Comments: Pt. able to recall and state 3/3 back precautions with glancing at his handout provided from PT during eval. Required Braces or Orthoses: Spinal Brace   Pertinent Vitals/Pain Patient reports pain in his back and Rt knee 6-7/10 at rest; 8-9/10 with activity; Pt pressed PCA pump; RN present and notified of pain, as well.    Mobility  Bed Mobility Bed Mobility: Rolling Left;Left Sidelying to Sit;Sitting - Scoot to Edge of Bed Rolling Left: 5: Supervision Left Sidelying to Sit: 5: Supervision;HOB flat Sitting - Scoot to Edge of Bed: 5: Supervision Details for Bed Mobility Assistance: Pt. given VC for proper hand placement when rolling to follow back precautions. Transfers Transfers: Sit to Stand;Stand to Sit Sit to Stand: 4: Min assist;With upper extremity assist;From chair/3-in-1;From toilet Stand to Sit: 4: Min guard;With upper extremity assist;With armrests;To chair/3-in-1;To toilet Details for Transfer Assistance: Pt. min (A) for rising from  bed and toilet due to reported increased pain in his Rt knee and his back. Pt. stated he was unable to fully extend his Rt knee in standing and pushing through Bil UEs to help (A) himself in standing. Ambulation/Gait Ambulation/Gait Assistance: 4: Min guard;4: Min Environmental consultant (Feet): 20 Feet Assistive device: Rolling walker Ambulation/Gait Assistance Details: Pt. min guard with ambulation (with RW) with periods of min (A) with increased pain in his Rt knee and back (around surgical site). Pt. with very antalgic gt from bed>bathrom>recliner where he wished to end session for today. RN notified of pain in back and Rt knee and present during treatment to administer medications. Pt given VC for technique of direction changes to maintain back precautions. Gait Pattern: Step-to pattern;Decreased stride length;Antalgic;Trunk flexed General Gait Details: Pt. with antalgic and guarded gt. pattern Stairs: No Wheelchair Mobility Wheelchair Mobility: No      PT Goals Acute Rehab PT Goals PT Goal Formulation: With patient Time For Goal Achievement: 12/22/11 Potential to Achieve Goals: Good Pt will go Supine/Side to Sit: with modified independence PT Goal: Supine/Side to Sit - Progress: Progressing toward goal Pt will Stand: with modified independence PT Goal: Stand - Progress: Not met Pt will Ambulate: >150 feet;with modified independence;with rolling walker PT Goal: Ambulate - Progress: Not met Pt will Go Up / Down Stairs: Flight;with modified independence  Visit Information  Last PT Received On: 12/23/11 Assistance Needed: +1    Subjective Data  Subjective: "I feel like this knee will need to be drained" Pt. pointing to his Rt. knee Patient Stated Goal:  to go home   Cognition  Overall Cognitive Status: Appears within functional limits for tasks assessed/performed Arousal/Alertness: Awake/alert Orientation Level: Appears intact for tasks assessed Behavior During Session: [facility]  for tasks performed    Balance  Balance Balance Assessed: Yes Static Standing Balance Static Standing - Balance Support: Left upper extremity supported;During functional activity Static Standing - Level of Assistance: 5: Stand by assistance Static Standing - Comment/# of Minutes: Pt. standing during toilet cleanup with Rt UE on RW. Pt. SBA for safety and balance and VC for following back precautions and not to twist to get toilet paper.  End of Session PT - End of Session Equipment Utilized During Treatment: Gait belt;Back brace Activity Tolerance: Patient limited by pain Patient left: in chair;with call bell/phone within reach Nurse Communication: Mobility status;Patient requests pain meds;Other (comment) (Pt. reporting increased pain in Rt. knee)    Adira Limburg, SPTA 12/23/2011, 10:45 AM

## 2011-12-23 NOTE — Progress Notes (Signed)
Pt hv tubing disconnected from tubing in back d/t pt trying to get up by himself.  HV thrown in trash and other end of tubing still connected into back.  Pt stable under no s/s distress. No bleeding.  Will leave for MD to evaluate.

## 2011-12-23 NOTE — Progress Notes (Signed)
Occupational Therapy Treatment Patient Details Name: Dale [name] MRN: [id number] DOB: 02-Jun-1960 Today's Date: 12/23/2011 Time: 1010-1045 OT Time Calculation (min): 35 min  OT Assessment / Plan / Recommendation Comments on Treatment Session Pt limited by pain, primarily from knee.  Educated in use of AE for LB ADL and where he may acquire.  Generally, at a supervision to min guard assist.  Needs to practice shower transfer, has a built in shower seat. Needs a 3 in1 for home.    Follow Up Recommendations  No OT follow up    Barriers to Discharge       Equipment Recommendations  3 in 1    Recommendations for Other Services    Frequency Min 3X/week   Plan Discharge plan remains appropriate    Precautions / Restrictions Precautions Precautions: Back Precaution Booklet Issued: Yes (comment) Precaution Comments: pt stated 3/3 back precautions, instructed in precautions related to IADL Required Braces or Orthoses: Spinal Brace Spinal Brace: Lumbar corset   Pertinent Vitals/Pain R knee, did not rate, MD aware, meds provided    ADL  Grooming: Supervision/safety Where Assessed - Grooming: Unsupported standing Lower Body Bathing: Other (comment) (instructed in use of long handled sponge) Where Assessed - Lower Body Bathing: Unsupported sitting Lower Body Dressing: Supervision/safety;Other (comment) (practiced use of reacher, sock aide, long shoe horn) Where Assessed - Lower Body Dressing: Unsupported sitting;Supported sit to stand Toilet Transfer: Min Pension scheme manager Method: Sit to Barista: Comfort height toilet Toileting - Clothing Manipulation and Hygiene: Supervision/safety;Other (comment) (instructed in back precautions related to pericare) Where Assessed - Toileting Clothing Manipulation and Hygiene: Standing Equipment Used: Back brace;Gait belt;Reacher;Rolling walker;Sock aid;Long-handled shoe horn;Long-handled sponge Transfers/Ambulation Related  to ADLs: min guard with RW ADL Comments: Pt with complaints of pain in R knee, MD is aware.    OT Diagnosis:    OT Problem List:   OT Treatment Interventions:     OT Goals Acute Rehab OT Goals OT Goal Formulation: With patient Time For Goal Achievement: 01/05/12 Potential to Achieve Goals: Good ADL Goals Pt Will Perform Grooming: with supervision;Standing at sink ADL Goal: Grooming - Progress: Progressing toward goals Pt Will Perform Lower Body Dressing: with adaptive equipment;with supervision;Sit to stand from chair ADL Goal: Lower Body Dressing - Progress: Progressing toward goals Pt Will Transfer to Toilet: with supervision;Comfort height toilet;Maintaining back safety precautions;Ambulation ADL Goal: Toilet Transfer - Progress: Progressing toward goals Pt Will Perform Toileting - Clothing Manipulation: with supervision;Standing ADL Goal: Toileting - Clothing Manipulation - Progress: Met Pt Will Perform Tub/Shower Transfer: Shower transfer;with supervision;Maintaining back safety precautions  Visit Information  Last OT Received On: 12/23/11 Assistance Needed: +1    Subjective Data      Prior Functioning       Cognition  Overall Cognitive Status: Appears within functional limits for tasks assessed/performed Arousal/Alertness: Awake/alert Orientation Level: Appears intact for tasks assessed Behavior During Session: [facility] for tasks performed    Mobility  Shoulder Instructions Bed Mobility Bed Mobility: Not assessed Transfers Sit to Stand: 4: Min assist;With upper extremity assist;From chair/3-in-1;From toilet Stand to Sit: 4: Min guard;With upper extremity assist;With armrests;To chair/3-in-1;To toilet Details for Transfer Assistance: Pt. min (A) for rising from chair due to reported increased pain in his Rt knee and his back. Pt. stated he was unable to fully extend his Rt knee in standing and pushing through Bil UEs to help (A) himself in standing.       Exercises  Balance Balance Balance Assessed:     End of Session OT - End of Session Equipment Utilized During Treatment: Back brace Activity Tolerance: Patient limited by pain Patient left: in chair;with call bell/phone within reach  GO     Evern Bio 12/23/2011, 10:58 AM [phone]

## 2011-12-24 MED ORDER — METHYLPREDNISOLONE ACETATE 40 MG/ML IJ SUSP
40.0000 mg | Freq: Once | INTRAMUSCULAR | Status: AC
  Administered 2011-12-25: 40 mg via INTRA_ARTICULAR
  Filled 2011-12-24 (×2): qty 1

## 2011-12-24 MED ORDER — LIDOCAINE HCL (PF) 1 % IJ SOLN
5.0000 mL | Freq: Once | INTRAMUSCULAR | Status: AC
  Administered 2011-12-25: 5 mL
  Filled 2011-12-24 (×2): qty 5

## 2011-12-24 NOTE — Progress Notes (Signed)
Jahmeir Geisen, PTA [phone] 12/24/2011  

## 2011-12-24 NOTE — Progress Notes (Signed)
New dressing applied to pt incision. Pt under no s/s distress.

## 2011-12-24 NOTE — Progress Notes (Signed)
Patient ID: Dale [name], male   DOB: Jun 13, 1960, 51 y.o.   MRN: [id number] Subjective:  The patient is alert and pleasant. He complains of right knee pain when he moves it. He has had prior knee troubles and surgery/aspirations by Dr. Annell Greening.  Objective: Vital signs in last 24 hours: Temp:  [98 F (36.7 C)-100.1 F (37.8 C)] 98 F (36.7 C) (12/05 0943) Pulse Rate:  [101-112] 104  (12/05 0943) Resp:  [18-20] 20  (12/05 0943) BP: (112-139)/(56-84) 127/82 mmHg (12/05 0943) SpO2:  [96 %-100 %] 100 % (12/05 0943)  Intake/Output from previous day:   Intake/Output this shift: Total I/O In: 360 [P.O.:360] Out: -   Physical exam the patient is alert and oriented. His strength is normal his lower extremities. His wound is healing well without signs of infection. I removed the drain today.  Lab Results:  [facility] 12/22/11 0615  WBC 13.1*  HGB 12.5*  HCT 37.5*  PLT 157   BMET  Basename 12/22/11 0615 12/21/11 0951  NA 135 140  K 4.4 4.0  CL 96 101  CO2 32 27  GLUCOSE 115* 95  BUN 10 15  CREATININE 0.99 0.93  CALCIUM 8.6 9.4    Studies/Results: No results found.  Assessment/Plan: Postop day #3: The patient seems to be covering from his surgery well except as below.  Right knee pain: This seems orthopedic in nature. The patient has prior history of problems with his knees which have been treated by Dr. Ophelia Charter. I'll ask Dr. Ophelia Charter come by and see him.  LOS: 3 days     Farha Dano D 12/24/2011, 9:49 AM

## 2011-12-24 NOTE — Progress Notes (Signed)
Physical Therapy Treatment Patient Details Name: Dale [name] MRN: [id number] DOB: [birth year] Today's Date: 12/24/2011 Time: 9147-8295 PT Time Calculation (min): 23 min  PT Assessment / Plan / Recommendation Comments on Treatment Session  Pt complaining of increased pain with knee this session. Spoke with Dr. Ophelia Charter who will be checking on his R knee tomorrow. He was unable to bear much weight through his R leg during ambulation nor was he able to tolerate any movement of knee out of ~40 degrees flexion. Unable to attempt stairs secondary to pain with all RLE weight bearing.    Follow Up Recommendations  No PT follow up     Does the patient have the potential to tolerate intense rehabilitation     Barriers to Discharge        Equipment Recommendations  None recommended by OT    Recommendations for Other Services    Frequency Min 4X/week   Plan Discharge plan remains appropriate;Frequency remains appropriate    Precautions / Restrictions Precautions Precautions: Back Precaution Comments: pt able to verbalize and maintain 3/3 back precautions Required Braces or Orthoses: Spinal Brace Spinal Brace: Lumbar corset Restrictions Weight Bearing Restrictions: No   Pertinent Vitals/Pain Pain 10/10 in R knee. RN aware.      Mobility  Bed Mobility Bed Mobility: Rolling Left;Left Sidelying to Sit;Sitting - Scoot to Edge of Bed Rolling Left: 5: Supervision Left Sidelying to Sit: 5: Supervision;HOB flat Sitting - Scoot to Edge of Bed: 5: Supervision Details for Bed Mobility Assistance: VC for proper technique to avoid twisting Transfers Transfers: Sit to Stand;Stand to Sit Sit to Stand: 4: Min guard;With upper extremity assist;From bed Stand to Sit: 4: Min guard;With upper extremity assist;To chair/3-in-1 Details for Transfer Assistance: Minguard for stability with tactile cues for proper hand placement. Pt unable to extend R knee or bear much weight while  standing Ambulation/Gait Ambulation/Gait Assistance: 4: Min guard Ambulation Distance (Feet): 100 Feet Assistive device: Rolling walker Ambulation/Gait Assistance Details: Pt complained of increased pain with both knee flexion and extension, unable to bear much weight through R knee.  Gait Pattern: Step-to pattern;Decreased stride length;Antalgic;Trunk flexed Gait velocity: decreased    Exercises     PT Diagnosis:    PT Problem List:   PT Treatment Interventions:     PT Goals Acute Rehab PT Goals PT Goal: Supine/Side to Sit - Progress: Progressing toward goal PT Goal: Stand - Progress: Progressing toward goal PT Goal: Ambulate - Progress: Progressing toward goal  Visit Information  Last PT Received On: 12/24/11 Assistance Needed: +1    Subjective Data      Cognition  Overall Cognitive Status: Appears within functional limits for tasks assessed/performed Arousal/Alertness: Awake/alert Orientation Level: Appears intact for tasks assessed Behavior During Session: [facility] for tasks performed    Balance     End of Session PT - End of Session Equipment Utilized During Treatment: Gait belt;Back brace Activity Tolerance: Patient limited by pain Patient left: in chair;with call bell/phone within reach;with family/visitor present Nurse Communication: Mobility status;Patient requests pain meds;Other (comment) (knee inflammed)   GP     [place] Richelle Ito 12/24/2011, 2:35 PM

## 2011-12-25 MED ORDER — LIDOCAINE HCL (PF) 1 % IJ SOLN
5.0000 mL | Freq: Once | INTRAMUSCULAR | Status: AC
  Administered 2011-12-25: 5 mL

## 2011-12-25 MED ORDER — OXYCODONE-ACETAMINOPHEN 10-325 MG PO TABS: 1.0000 | ORAL_TABLET | ORAL | Status: DC | PRN

## 2011-12-25 MED ORDER — DSS 100 MG PO CAPS: 100.0000 mg | ORAL_CAPSULE | Freq: Two times a day (BID) | ORAL | Status: DC

## 2011-12-25 MED ORDER — DIAZEPAM 5 MG PO TABS: 5.0000 mg | ORAL_TABLET | Freq: Four times a day (QID) | ORAL | Status: DC | PRN

## 2011-12-25 NOTE — Discharge Summary (Signed)
  Physician Discharge Summary  Patient ID: Dale [name] MRN: [id number] DOB/AGE: 04/14/1960 51 y.o.  Admit date: 12/21/2011 Discharge date: 12/25/2011  Admission Diagnoses: L4-5 spondylolisthesis, facet arthropathy, spinal stenosis, lumbago, lumbar radiculopathy, neurogenic claudication.  Discharge Diagnoses: The same Active Problems:  * No active hospital problems. *    Discharged Condition: good  Hospital Course: I admitted the patient to [facility] on 12-13. On that day I performed an L4-5 decompression, instrumentation, and fusion. The surgery went well.  The patient's postoperative course was remarkable only for some right knee pain and swelling. The patient has prior history of knee problems and has had surgery and aspirations by Dr. Ophelia Charter. Dr. Ophelia Charter is going to see the patient later on today for further recommendations.  The patient has requested discharge to home. He was given oral and written discharge instructions. All his questions were answered.  Consults: Orthopedic surgery, Dr. Annell Greening, Significant Diagnostic Studies: None Treatments: L4-5 decompression, agitation, and fusion. Discharge Exam: Blood pressure 127/76, pulse 98, temperature 98.1 F (36.7 C), temperature source Oral, resp. rate 20, height 5\' 11"  (1.803 m), weight 83.915 kg (185 lb), SpO2 100.00%. The patient is alert and pleasant. He looks well. His strength is grossly normal his lower extremities.  Disposition: Home     Medication List     As of 12/25/2011  7:47 AM    ASK your doctor about these medications         aspirin 81 MG tablet   Take 81 mg by mouth daily.      atorvastatin 10 MG tablet   Commonly known as: LIPITOR   Take 1 tablet (10 mg total) by mouth daily.      glucosamine-chondroitin 500-400 MG tablet   Take 1 tablet by mouth daily.      ibuprofen 800 MG tablet   Commonly known as: ADVIL,MOTRIN   Take 800 mg by mouth every 8 (eight) hours as needed.     loratadine-pseudoephedrine 10-240 MG per 24 hr tablet   Commonly known as: CLARITIN-D 24-hour   Take 1 tablet by mouth daily.      LORazepam 0.5 MG tablet   Commonly known as: ATIVAN   Take 0.5 mg by mouth every 8 (eight) hours as needed. For anxiety      multivitamin,tx-minerals tablet   Take 1 tablet by mouth daily.      omeprazole 40 MG capsule   Commonly known as: PRILOSEC   Take 40 mg by mouth daily as needed. For acid reflux      oxyCODONE-acetaminophen 5-325 MG per tablet   Commonly known as: PERCOCET/ROXICET   Take 1 tablet by mouth every 4 (four) hours as needed. For pain      sildenafil 100 MG tablet   Commonly known as: VIAGRA   Take 100 mg by mouth daily as needed. For erectile dysfunction      traMADol 50 MG tablet   Commonly known as: ULTRAM   Take 50 mg by mouth every 6 (six) hours as needed.         SignedCristi Loron 12/25/2011, 7:47 AM

## 2011-12-25 NOTE — Progress Notes (Signed)
Occupational Therapy Treatment and Discharge Patient Details Name: Dale [name] MRN: [id number] DOB: [birth year] Today's Date: 12/25/2011 Time: 7846-9629 OT Time Calculation (min): 8 min  OT Assessment / Plan / Recommendation Comments on Treatment Session All education completed.    Follow Up Recommendations  No OT follow up       Equipment Recommendations  None recommended by OT       Frequency Min 3X/week   Plan Discharge plan remains appropriate    Precautions / Restrictions Precautions Precautions: Back Required Braces or Orthoses: Spinal Brace Spinal Brace: Applied in sitting position Restrictions Weight Bearing Restrictions: No   Pertinent Vitals/Pain Right knee    ADL  ADL Comments: In and spoke with pt about roll of OT and the one thing that was left for him to try (walk in shower transfer). Pt has a walk in shower with built in seat. Pt is tall so advised him that he may want to see if his 3n1 at home will fit in his shower at home and use it since it is taller and has arms to reach back to and push up from. Also spoke to him about backing into the shower stall v. trying to go in forward with the RW in front of him and then havign to try to turn around in the shower with the RW.  Pt reports that he does not have any further questions about BADLs and does not feel he needs to practice the shower transfer.      OT Goals ADL Goals Pt Will Perform Tub/Shower Transfer:  (Pt verbalized understanding, he did not feel he needed to practice)  Visit Information  Last OT Received On: 12/25/11 Assistance Needed: +1    Subjective Data  Subjective: I don't think I need to do that (offered to let him practice the way I had verbally wallked him through stepping into and out a shower stall) Patient Stated Goal: Get knee looked at and go home today      Cognition  Overall Cognitive Status: Appears within functional limits for tasks assessed/performed Arousal/Alertness:  Awake/alert Behavior During Session: Dale [name] for tasks performed             End of Session OT - End of Session Patient left: in bed;with call bell/phone within reach       Dale [name] [phone] 12/25/2011, 1:15 PM

## 2011-12-25 NOTE — Progress Notes (Signed)
Dc instructions provided to pt. Pt verbalized understanding. Pt rx given to pt. Pt under no s/s distress.

## 2011-12-25 NOTE — Consult Note (Signed)
Reason for Consult: right knee pain and swelling Referring Physician: [name] is an 51 y.o. male.  HPI: Pt with swelling and pain in right knee since surgery on Monday for lumbar fusion.  Significant history of chronic right knee pain relieved with intra articular steroid injections.  S/P ORIF of right patella fracture >10 yrs ago.  Last injection in Dr Ophelia Charter office >1 year ago with complete relief of pain.  No recent injury to knee.  Was not having swelling or pain in the right knee pre op.  Concerned that knee was injured during surgery, specifically asking about hyperextension of knee.  Pain and swelling has improved over the past 24 hrs with rest and use of ice.  Pain with weight bearing, no popping ,locking or giving away of knee. Was prone for the surgery with knees supported. No history of gout. No new xrays of knee during hospital stay.  Past Medical History  Diagnosis Date  . Allergy   . Depression   . Hyperlipidemia   . History of nephrolithiasis   . Chest pain, non-cardiac     normal stress test on 08-26-09   . GERD (gastroesophageal reflux disease)     TAKES MED  . Arthritis     "SOME IN BACK"    Past Surgical History  Procedure Date  . Knee surgery 83, 89, 90, 01, 02    rt knee x 5  . Knee surgery 2007    left knee meniscus repair  . Rotator cuff repair 2003  . Mri  of lumbar spine 2011    Family History  Problem Relation Age of Onset  . Heart attack Father   . Coronary artery disease Father   . Depression      family hx  . Hyperlipidemia      family hx  . Hypertension      family hx  . Lung cancer      family hx  . Stroke      family hx    Social History:  reports that he has never smoked. He has never used smokeless tobacco. He reports that he drinks about one ounce of alcohol per week. He reports that he does not use illicit drugs.  Allergies: No Known Allergies  Medications:  Prior to Admission:  Prescriptions prior to admission   Medication Sig Dispense Refill  . aspirin 81 MG tablet Take 81 mg by mouth daily.        Marland Kitchen atorvastatin (LIPITOR) 10 MG tablet Take 1 tablet (10 mg total) by mouth daily.  30 tablet  11  . glucosamine-chondroitin 500-400 MG tablet Take 1 tablet by mouth daily.      Marland Kitchen loratadine-pseudoephedrine (CLARITIN-D 24-HOUR) 10-240 MG per 24 hr tablet Take 1 tablet by mouth daily.        . Multiple Vitamins-Minerals (MULTIVITAMIN,TX-MINERALS) tablet Take 1 tablet by mouth daily.        Marland Kitchen omeprazole (PRILOSEC) 40 MG capsule Take 40 mg by mouth daily as needed. For acid reflux      . sildenafil (VIAGRA) 100 MG tablet Take 100 mg by mouth daily as needed. For erectile dysfunction      . [DISCONTINUED] ibuprofen (ADVIL,MOTRIN) 800 MG tablet Take 800 mg by mouth every 8 (eight) hours as needed.      . [DISCONTINUED] LORazepam (ATIVAN) 0.5 MG tablet Take 0.5 mg by mouth every 8 (eight) hours as needed. For anxiety      . [DISCONTINUED] oxyCODONE-acetaminophen (  PERCOCET/ROXICET) 5-325 MG per tablet Take 1 tablet by mouth every 4 (four) hours as needed. For pain      . [DISCONTINUED] traMADol (ULTRAM) 50 MG tablet Take 50 mg by mouth every 6 (six) hours as needed.        No results found for this or any previous visit (from the past 48 hour(s)).  No results found.  Review of Systems  Musculoskeletal:       Right knee with pain and swelling  All other systems reviewed and are negative.   Blood pressure 120/82, pulse 98, temperature 98.5 F (36.9 C), temperature source Oral, resp. rate 18, height 5\' 11"  (1.803 m), weight 83.915 kg (185 lb), SpO2 100.00%. Physical Exam  Constitutional: He appears well-developed and well-nourished.  HENT:  Head: Normocephalic and atraumatic.  Musculoskeletal:       Right knee with prepatellar edema.  Minimal to no effusion of knee joint.  Extension -5, flexion 100 degrees.  Tender with patella compression.  No maltracking of patella.  Non tender joint line.  No ligamentous  instability with valgus or varus stress at 0 and 30 degrees.  Negative lachman exam, neg anterior drawer sign Distal pulse intact LEs. Calves soft and nontender bilateral    Assessment/Plan: Prepatellar bursitis right knee.  Discussed with pt that this could be caused by position of his knee during his surgery, however there does not appear to be an actual injury to the knee.  Pressure on the patella during prone position on a patella that has previously been surgically repaired may lead to bursitis, and this correlates most to his symptoms and exam.  PLAN:  Pt wishes to have intra articular injection to the right knee as this has given him relief in the past.  Procedure:  After verbal informed consent, pts right knee was prepped with betadine and alcohol.  Four cc of 1% plain lidocaine used for local.  Attempted aspiration without any joint fluid obtained.  Depomedrol 40mg  and 5 cc lidocaine 1% plain instilled into the right knee.  Pt tolerated well.  Encouraged continued ice and rest with anticipation of relief of symptoms over next 24-48 hrs.  He will be discharged home today and can follow up with Dr Ophelia Charter in 2 weeks or as needed. NSAIDs would be helpful when he is allowed to start using them post op.   All questions encouraged and answered.    Dale [name] M 12/25/2011, 11:47 AM

## 2011-12-25 NOTE — Progress Notes (Signed)
Physical Therapy Treatment Patient Details Name: Dale [name] MRN: [id number] DOB: [birth year] Today's Date: 12/25/2011 Time: 4132-4401 PT Time Calculation (min): 16 min  PT Assessment / Plan / Recommendation Comments on Treatment Session  Pt was given cortisone shot today in R knee, feeling better although still unable to bear full weight through RLE. Pt was able to increase ambulation and complete stairs with supervision level only. Plan for d/c this afternoon with RW for safety    Follow Up Recommendations  No PT follow up     Does the patient have the potential to tolerate intense rehabilitation     Barriers to Discharge        Equipment Recommendations  None recommended by OT;Rolling walker with 5" wheels    Recommendations for Other Services    Frequency Min 4X/week   Plan Discharge plan remains appropriate;Frequency remains appropriate    Precautions / Restrictions Precautions Precautions: Back Precaution Comments: pt able to verbalize 3/3 back precautions Required Braces or Orthoses: Spinal Brace Spinal Brace: Applied in sitting position Restrictions Weight Bearing Restrictions: No   Pertinent Vitals/Pain Pain in R knee 3/10    Mobility  Bed Mobility Bed Mobility: Not assessed Transfers Transfers: Sit to Stand;Stand to Sit Sit to Stand: 4: Min guard;With upper extremity assist;From bed Stand to Sit: 4: Min guard;With upper extremity assist;To chair/3-in-1 Details for Transfer Assistance: Minguard for stability with tactile cues for proper hand placement. Pt unable to extend R knee or bear much weight while standing Ambulation/Gait Ambulation/Gait Assistance: 5: Supervision Ambulation Distance (Feet): 200 Feet Assistive device: Rolling walker Ambulation/Gait Assistance Details: Supervision for safety. Pt able to bear more weight this session although still limited with R weight ebaring Gait Pattern: Step-to pattern;Decreased stride length;Antalgic;Trunk  flexed Gait velocity: decreased Stairs: Yes Stairs Assistance: 5: Supervision Stairs Assistance Details (indicate cue type and reason): VC for proper sequencing. Assist with HHA, significant other educated and participated Stair Management Technique: One rail Left;Other (comment);Step to pattern;Forwards (1 person HHA) Number of Stairs: 12     Exercises     PT Diagnosis:    PT Problem List:   PT Treatment Interventions:     PT Goals Acute Rehab PT Goals PT Goal: Ambulate - Progress: Progressing toward goal PT Goal: Up/Down Stairs - Progress: Progressing toward goal  Visit Information  Last PT Received On: 12/25/11 Assistance Needed: +1    Subjective Data      Cognition  Overall Cognitive Status: Appears within functional limits for tasks assessed/performed Arousal/Alertness: Awake/alert Behavior During Session: [facility] for tasks performed    Balance     End of Session PT - End of Session Equipment Utilized During Treatment: Gait belt;Back brace Activity Tolerance: Patient limited by pain Patient left: in chair;with call bell/phone within reach;with family/visitor present Nurse Communication: Mobility status;Patient requests pain meds;Other (comment)   GP     Milana Kidney 12/25/2011, 3:52 PM

## 2011-12-25 NOTE — Progress Notes (Signed)
12/25/2011 1455 NCM contacted [facility] DME rep for RW for home. NCM spoke to Unit RN to explain RW will be delivered to room prior to d/c home. Isidoro Donning RN CCM Case Mgmt phone [phone]

## 2011-12-30 ENCOUNTER — Ambulatory Visit (INDEPENDENT_AMBULATORY_CARE_PROVIDER_SITE_OTHER): Payer: 59 | Admitting: Family Medicine

## 2011-12-30 ENCOUNTER — Encounter: Payer: Self-pay | Admitting: Family Medicine

## 2011-12-30 VITALS — BP 120/86 | HR 104 | Temp 97.8°F | Wt 187.0 lb

## 2011-12-30 DIAGNOSIS — F329 Major depressive disorder, single episode, unspecified: Secondary | ICD-10-CM

## 2011-12-30 MED ORDER — FLUOXETINE HCL 20 MG PO TABS: 20.0000 mg | ORAL_TABLET | Freq: Every day | ORAL | Status: DC

## 2011-12-30 NOTE — Progress Notes (Signed)
  Subjective:    Patient ID: Dale [name], male    DOB: Jun 01, 1960, 51 y.o.   MRN: [id number]  HPI Here to discuss depression. He has been using Diazepam for situational anxiety for some time, but lately he has had depression as well. He feels sad, hopeless, overwhelmed, etc. He sleeps well. He had lumbar spine surgery a week ago and he is recovering well from this. His girlfriend recently left him. He has used Prozac twice in the past and did well.    Review of Systems  Constitutional: Negative.   Psychiatric/Behavioral: Positive for dysphoric mood and decreased concentration. Negative for suicidal ideas, behavioral problems, confusion and agitation. The patient is nervous/anxious.        Objective:   Physical Exam  Constitutional: He appears well-developed and well-nourished.  Psychiatric: He has a normal mood and affect. His behavior is normal. Thought content normal.          Assessment & Plan:  We will get him on Prozac 20 mg a day. Recheck in 3 weeks

## 2012-01-25 ENCOUNTER — Telehealth: Payer: Self-pay | Admitting: Family Medicine

## 2012-01-25 NOTE — Telephone Encounter (Signed)
Caller: Dale [name]/Patient; PCP: [name] (Family Practice); CB#: [phone]; Call regarding Changing Prozac dose ;    Today, 01/25/2012, pt calling stating he was placed on Prozac 20 mg one daily last month for depression.  He is wanting to know if the dose could be increased to 40  mg daily that he has taken in the past because  no improvement with his mood. RN  accessed EPIC and noted OV 12/30/2011  and was to recheck in 3 weeks. Pt is wanting to know if he can get a message to Dr. Clent Ridges to see if Prozac can be increased and if so needs a new rx sent to [facility]. RN  triaged and reached Call Provider within 24 hours for increasing symptoms and taking medication as prescribed per Depression protocol. OFFICE FOLLOW UP REQUEST -- PLEASE CALL PT BACK ABOUT INCREASING PROZAC AND NEW RX

## 2012-01-26 ENCOUNTER — Telehealth: Payer: Self-pay | Admitting: Family Medicine

## 2012-01-26 NOTE — Telephone Encounter (Signed)
Increase Prozac to 40 mg a day. Call in one year supply

## 2012-01-26 NOTE — Telephone Encounter (Signed)
[facility] Triage Call Report Triage Record Num: [id number] Operator: Aundra Millet Patient Name: Dale [name] Call Date & Time: 01/25/2012 5:36:51PM Patient Phone: [phone] PCP: Tera Mater. Clent Ridges Patient Gender: Male PCP Fax : [phone] Patient DOB: 01/29/1960 Practice Name: [facility] Reason for Call: Caller: Dale [name]/Patient; PCP: [name] [facility]); CB#: [phone]; Call regarding Changing Prozac dose ; Today, 01/25/2012, pt calling stating he was placed on Prozac 20 mg one daily last month for depression. He is wanting to know if the dose could be increased to 40 mg daily that he has taken in the past because no improvement with his mood. RN accessed EPIC and noted OV 12/30/2011 and was to recheck in 3 weeks. Pt is wanting to know if he can get a message to Dr. Clent Ridges to see if Prozac can be increased and if so needs a new rx sent to [facility]. RN triaged and reached Call Provider within 24 hours for increasing symptoms and taking medication as prescribed per Depression protocol. OFFICE FOLLOW UP REQUEST -- PLEASE CALL PT BACK ABOUT INCREASING PROZAC AND NEW RX Protocol(s) Used: Depression Recommended Outcome per Protocol: Call Provider within 24 Hours Reason for Outcome: New or increasing symptoms AND taking medications/following therapy as prescribed Care Advice: ~ Regularly scheduled visits from family or friends can help provide needed support. Continue to follow your treatment plan. Take all medications as prescribed and keep all appointments with your provider. ~ Call provider immediately if develop unexplained crying spells, feelings of depression that interfere with work, school or family life; or are using alcohol to help you cope. ~ Avoid the use of stimulants including caffeine (coffee, some soft drinks, some energy drinks, tea and chocolate), cocaine, and amphetamines. Also avoid drinking alcohol. ~ ~ Eat a balanced diet and follow a  regular sleep schedule with adequate sleep, about 7 to 8 hours a night. ~ SYMPTOM / CONDITION MANAGEMENT Medication Advice: - Discontinue all nonprescription and alternative medications, especially stimulants, until evaluated by provider. - Take prescribed medications as directed, following label instructions for the medication. - Do not change medications or dosing regimen until provider is consulted. - Know possible side effects of medication and what to do if they occur. - Tell provider all prescription, nonprescription or alternative medications that you take ~ 01/25/2012 5:53:30PM Page 1 of 1 [id number]

## 2012-01-27 MED ORDER — FLUOXETINE HCL 40 MG PO CAPS: 40.0000 mg | ORAL_CAPSULE | Freq: Every day | ORAL | Status: DC

## 2012-01-27 NOTE — Telephone Encounter (Signed)
Script was sent e-scribe and I spoke with pt. 

## 2012-02-16 ENCOUNTER — Other Ambulatory Visit: Payer: Self-pay | Admitting: Family

## 2012-03-05 ENCOUNTER — Other Ambulatory Visit: Payer: Self-pay

## 2012-04-19 ENCOUNTER — Ambulatory Visit (INDEPENDENT_AMBULATORY_CARE_PROVIDER_SITE_OTHER): Payer: 59 | Admitting: Family Medicine

## 2012-04-19 ENCOUNTER — Encounter: Payer: Self-pay | Admitting: Family Medicine

## 2012-04-19 VITALS — BP 140/86 | HR 92 | Temp 98.4°F | Wt 192.0 lb

## 2012-04-19 DIAGNOSIS — Z9109 Other allergy status, other than to drugs and biological substances: Secondary | ICD-10-CM

## 2012-04-19 MED ORDER — METHYLPREDNISOLONE ACETATE 80 MG/ML IJ SUSP
120.0000 mg | Freq: Once | INTRAMUSCULAR | Status: AC
  Administered 2012-04-19: 120 mg via INTRAMUSCULAR

## 2012-04-19 NOTE — Progress Notes (Signed)
  Subjective:    Patient ID: Dale [name], male    DOB: 05-13-1960, 52 y.o.   MRN: [id number]  HPI Here for his usual springtime allergies. He gets stuffy head, PND, and ST despite taking Claritin D. No cough or fever.    Review of Systems  Constitutional: Negative.   HENT: Positive for congestion, rhinorrhea, sneezing, postnasal drip and sinus pressure.   Eyes: Negative.   Respiratory: Negative.        Objective:   Physical Exam  Constitutional: He appears well-developed and well-nourished.  HENT:  Right Ear: External ear normal.  Left Ear: External ear normal.  Nose: Nose normal.  Mouth/Throat: Oropharynx is clear and moist.  Eyes: Conjunctivae are normal.  Pulmonary/Chest: Effort normal and breath sounds normal.  Lymphadenopathy:    He has no cervical adenopathy.          Assessment & Plan:  Recheck prn

## 2012-04-19 NOTE — Addendum Note (Signed)
Addended by: Aniceto Boss A on: 04/19/2012 03:21 PM   Modules accepted: Orders

## 2012-04-22 ENCOUNTER — Ambulatory Visit (INDEPENDENT_AMBULATORY_CARE_PROVIDER_SITE_OTHER): Payer: 59 | Admitting: Family Medicine

## 2012-04-22 ENCOUNTER — Encounter: Payer: Self-pay | Admitting: Family Medicine

## 2012-04-22 VITALS — BP 140/82 | HR 82 | Temp 98.7°F | Wt 193.0 lb

## 2012-04-22 DIAGNOSIS — R310 Gross hematuria: Secondary | ICD-10-CM

## 2012-04-22 LAB — POCT URINALYSIS DIPSTICK
Bilirubin, UA: NEGATIVE
Glucose, UA: NEGATIVE
Ketones, UA: NEGATIVE
Leukocytes, UA: NEGATIVE
Nitrite, UA: NEGATIVE
Spec Grav, UA: >=1.03
Urobilinogen, UA: 0.2
pH, UA: 6

## 2012-04-22 NOTE — Addendum Note (Signed)
Addended by: Aniceto Boss A on: 04/22/2012 04:57 PM   Modules accepted: Orders

## 2012-04-22 NOTE — Progress Notes (Signed)
  Subjective:    Patient ID: Dale [name], male    DOB: 1960/08/24, 52 y.o.   MRN: [id number]  HPI Here for the onset yesterday of red blood in the urine. He also noticed some mild seepage of blood into his underwear last night. No discomfort or urgency. No back pain.    Review of Systems  Constitutional: Negative.   Gastrointestinal: Negative.   Genitourinary: Positive for hematuria. Negative for dysuria, urgency, frequency, flank pain, discharge, penile swelling, scrotal swelling, enuresis, difficulty urinating, genital sores, penile pain and testicular pain.       Objective:   Physical Exam  Constitutional: He appears well-developed and well-nourished.  Abdominal: Soft. Bowel sounds are normal. He exhibits no distension and no mass. There is no tenderness. There is no rebound and no guarding.  Genitourinary: Rectum normal, prostate normal and penis normal. No penile tenderness.          Assessment & Plan:  We will culture the urine but I doubt there is any infection. Doubt this is a stone due to the lack of pain. Refer to Urology

## 2012-04-24 LAB — URINE CULTURE
Colony Count: NO GROWTH
Organism ID, Bacteria: NO GROWTH

## 2012-04-28 NOTE — Progress Notes (Signed)
Quick Note:  Results released in my chart. ______ 

## 2012-05-11 ENCOUNTER — Telehealth: Payer: Self-pay | Admitting: Family Medicine

## 2012-05-11 NOTE — Telephone Encounter (Signed)
Patient called stating that he asked to be referred to any Urologist except [facility] due to a conflict of interest and [facility] called him for appt. Please refer to another Urology office.

## 2012-05-11 NOTE — Telephone Encounter (Signed)
Joni Reining, could you change this referral for hematuria to a urology group outside of [place] (he cannot see [facility])

## 2012-05-16 ENCOUNTER — Other Ambulatory Visit: Payer: Self-pay | Admitting: Family Medicine

## 2012-05-17 NOTE — Telephone Encounter (Signed)
Call in #[phone] with 5 rf 

## 2012-06-07 ENCOUNTER — Emergency Department (HOSPITAL_COMMUNITY): Payer: 59

## 2012-06-07 ENCOUNTER — Encounter (HOSPITAL_COMMUNITY): Payer: Self-pay | Admitting: Emergency Medicine

## 2012-06-07 ENCOUNTER — Emergency Department (HOSPITAL_COMMUNITY)
Admission: EM | Admit: 2012-06-07 | Discharge: 2012-06-07 | Disposition: A | Discharge status: Home / Self Care | Payer: 59 | Attending: Emergency Medicine | Admitting: Emergency Medicine

## 2012-06-07 DIAGNOSIS — Z862 Personal history of diseases of the blood and blood-forming organs and certain disorders involving the immune mechanism: Secondary | ICD-10-CM | POA: Insufficient documentation

## 2012-06-07 DIAGNOSIS — Z7982 Long term (current) use of aspirin: Secondary | ICD-10-CM | POA: Insufficient documentation

## 2012-06-07 DIAGNOSIS — R319 Hematuria, unspecified: Secondary | ICD-10-CM

## 2012-06-07 DIAGNOSIS — Z87442 Personal history of urinary calculi: Secondary | ICD-10-CM | POA: Insufficient documentation

## 2012-06-07 DIAGNOSIS — F329 Major depressive disorder, single episode, unspecified: Secondary | ICD-10-CM | POA: Insufficient documentation

## 2012-06-07 DIAGNOSIS — K219 Gastro-esophageal reflux disease without esophagitis: Secondary | ICD-10-CM | POA: Insufficient documentation

## 2012-06-07 DIAGNOSIS — Z79899 Other long term (current) drug therapy: Secondary | ICD-10-CM | POA: Insufficient documentation

## 2012-06-07 DIAGNOSIS — M129 Arthropathy, unspecified: Secondary | ICD-10-CM | POA: Insufficient documentation

## 2012-06-07 DIAGNOSIS — Z981 Arthrodesis status: Secondary | ICD-10-CM | POA: Insufficient documentation

## 2012-06-07 DIAGNOSIS — F3289 Other specified depressive episodes: Secondary | ICD-10-CM | POA: Insufficient documentation

## 2012-06-07 DIAGNOSIS — M545 Low back pain, unspecified: Secondary | ICD-10-CM | POA: Insufficient documentation

## 2012-06-07 DIAGNOSIS — Z8639 Personal history of other endocrine, nutritional and metabolic disease: Secondary | ICD-10-CM | POA: Insufficient documentation

## 2012-06-07 DIAGNOSIS — Z8719 Personal history of other diseases of the digestive system: Secondary | ICD-10-CM | POA: Insufficient documentation

## 2012-06-07 LAB — CBC WITH DIFFERENTIAL/PLATELET
Basophils Absolute: 0.1 10*3/uL (ref 0.0–0.1)
Basophils Relative: 1 % (ref 0–1)
Eosinophils Absolute: 0.2 10*3/uL (ref 0.0–0.7)
Eosinophils Relative: 2 % (ref 0–5)
HCT: 42.6 % (ref 39.0–52.0)
Hemoglobin: 15 g/dL (ref 13.0–17.0)
Lymphocytes Relative: 21 % (ref 12–46)
Lymphs Abs: 1.7 10*3/uL (ref 0.7–4.0)
MCH: 30.9 pg (ref 26.0–34.0)
MCHC: 35.2 g/dL (ref 30.0–36.0)
MCV: 87.7 fL (ref 78.0–100.0)
Monocytes Absolute: 0.9 10*3/uL (ref 0.1–1.0)
Monocytes Relative: 11 % (ref 3–12)
Neutro Abs: 5.5 10*3/uL (ref 1.7–7.7)
Neutrophils Relative %: 66 % (ref 43–77)
Platelets: 162 10*3/uL (ref 150–400)
RBC: 4.86 MIL/uL (ref 4.22–5.81)
RDW: 13.5 % (ref 11.5–15.5)
WBC: 8.3 10*3/uL (ref 4.0–10.5)

## 2012-06-07 LAB — URINE MICROSCOPIC-ADD ON

## 2012-06-07 LAB — BASIC METABOLIC PANEL
BUN: 17 mg/dL (ref 6–23)
CO2: 22 mEq/L (ref 19–32)
Calcium: 9.3 mg/dL (ref 8.4–10.5)
Chloride: 103 mEq/L (ref 96–112)
Creatinine, Ser: 0.87 mg/dL (ref 0.50–1.35)
GFR calc Af Amer: >90 mL/min (ref >90)
GFR calc non Af Amer: >90 mL/min (ref >90)
Glucose, Bld: 96 mg/dL (ref 70–99)
Potassium: 3.7 mEq/L (ref 3.5–5.1)
Sodium: 139 mEq/L (ref 135–145)

## 2012-06-07 LAB — URINALYSIS, ROUTINE W REFLEX MICROSCOPIC
Bilirubin Urine: NEGATIVE
Glucose, UA: NEGATIVE mg/dL
Ketones, ur: NEGATIVE mg/dL
Leukocytes, UA: NEGATIVE
Nitrite: NEGATIVE
Protein, ur: NEGATIVE mg/dL
Specific Gravity, Urine: 1.029 (ref 1.005–1.030)
Urobilinogen, UA: 0.2 mg/dL (ref 0.0–1.0)
pH: 5.5 (ref 5.0–8.0)

## 2012-06-07 NOTE — ED Notes (Signed)
Given warm balnket aware that he is going for ct

## 2012-06-07 NOTE — ED Notes (Signed)
Saw urologist in [place] may first  2014 and had labs andua done  Has not gotten results yet pt states this is 2nd time that this has happened in 2 months

## 2012-06-07 NOTE — ED Notes (Signed)
Tad bit of pain in abd and  Lower back . Since yesterday states rode lawnmower yesterday and felt something last night "come out"  Last night and realized it was blood coming from penis, had rosy colored ua this am it was lighter hx of stones and lumbar fusion in dec

## 2012-06-07 NOTE — ED Provider Notes (Signed)
History     CSN: [id number]  Arrival date & time 06/07/12  4098   First MD Initiated Contact with Patient 06/07/12 [phone]      Chief Complaint  Patient presents with  . Abdominal Pain    (Consider location/radiation/quality/duration/timing/severity/associated sxs/prior treatment) HPI Comments: Patient is a 52 year old male with a past medical history of kidney stones who presents with "blood coming out of his penis." Patient reports this happened about 3 weeks ago once and was seen by Urology who he did not follow up with for further testing. Patient reports another episode of blood coming from his penis yesterday after mowing the lawn. Patient reports waking up this morning with a "2 inch blood stain" in his underwear and became concerned. Patient denies any pain. No aggravating/alleviating factors. He reports mild associated left side back pain but thinks this may be due to his lumbar fusion in December of 2013. He says this does not feel like a kidney stone.    Patient is a 52 y.o. male presenting with abdominal pain.  Abdominal Pain Associated symptoms: hematuria     Past Medical History  Diagnosis Date  . Allergy   . Depression   . Hyperlipidemia   . History of nephrolithiasis   . Chest pain, non-cardiac     normal stress test on 08-26-09   . GERD (gastroesophageal reflux disease)     TAKES MED  . Arthritis     "SOME IN BACK"    Past Surgical History  Procedure Laterality Date  . Knee surgery  83, 89, 90, 01, 02    rt knee x 5  . Knee surgery  2007    left knee meniscus repair  . Rotator cuff repair  2003  . Posterior laminectomy / decompression lumbar spine  12-21-11    per Dr. Delma Officer     Family History  Problem Relation Age of Onset  . Heart attack Father   . Coronary artery disease Father   . Depression      family hx  . Hyperlipidemia      family hx  . Hypertension      family hx  . Lung cancer      family hx  . Stroke      family hx    History   Substance Use Topics  . Smoking status: Never Smoker   . Smokeless tobacco: Never Used  . Alcohol Use: 1.0 oz/week    2 drink(s) per week      Review of Systems  Gastrointestinal: Positive for abdominal pain.  Genitourinary: Positive for hematuria.  Musculoskeletal: Positive for back pain.  All other systems reviewed and are negative.    Allergies  Review of patient's allergies indicates no known allergies.  Home Medications   Current Outpatient Rx  Name  Route  Sig  Dispense  Refill  . aspirin 81 MG tablet   Oral   Take 81 mg by mouth daily.           Marland Kitchen EXPIRED: atorvastatin (LIPITOR) 10 MG tablet   Oral   Take 1 tablet (10 mg total) by mouth daily.   30 tablet   11   . diazepam (VALIUM) 5 MG tablet   Oral   Take 1 tablet (5 mg total) by mouth every 6 (six) hours as needed.   50 tablet   1   . glucosamine-chondroitin 500-400 MG tablet   Oral   Take 1 tablet by mouth daily.         Marland Kitchen  loratadine-pseudoephedrine (CLARITIN-D 24-HOUR) 10-240 MG per 24 hr tablet   Oral   Take 1 tablet by mouth daily.           Marland Kitchen LORazepam (ATIVAN) 0.5 MG tablet      TAKE 1 TABLET EVERY 8 HOURS AS NEEDED   60 tablet   5   . Multiple Vitamins-Minerals (MULTIVITAMIN,TX-MINERALS) tablet   Oral   Take 1 tablet by mouth daily.           Marland Kitchen omeprazole (PRILOSEC) 40 MG capsule   Oral   Take 20 mg by mouth daily as needed. OTC         . sildenafil (VIAGRA) 100 MG tablet   Oral   Take 100 mg by mouth daily as needed. For erectile dysfunction           BP 160/109  Pulse 87  Temp(Src) 98.7 F (37.1 C) (Oral)  Resp 12  SpO2 99%  Physical Exam  Nursing note and vitals reviewed. Constitutional: He is oriented to person, place, and time. He appears well-developed and well-nourished. No distress.  HENT:  Head: Normocephalic and atraumatic.  Eyes: Conjunctivae are normal.  Neck: Normal range of motion.  Cardiovascular: Normal rate and regular rhythm.  Exam  reveals no gallop and no friction rub.   No murmur heard. Pulmonary/Chest: Effort normal and breath sounds normal. He has no wheezes. He has no rales. He exhibits no tenderness.  Abdominal: Soft. He exhibits no distension. There is no tenderness. There is no rebound and no guarding.  Genitourinary: Penis normal.  No discharge or blood noted at urethral opening.   Musculoskeletal: Normal range of motion.  Neurological: He is alert and oriented to person, place, and time. Coordination normal.  Speech is goal-oriented. Moves limbs without ataxia.   Skin: Skin is warm and dry.  Psychiatric: He has a normal mood and affect. His behavior is normal.    ED Course  Procedures (including critical care time)  Labs Reviewed  URINALYSIS, ROUTINE W REFLEX MICROSCOPIC - Abnormal; Notable for the following:    Hgb urine dipstick MODERATE (*)    All other components within normal limits  URINE CULTURE  CBC WITH DIFFERENTIAL  BASIC METABOLIC PANEL  URINE MICROSCOPIC-ADD ON   Ct Abdomen Pelvis Wo Contrast  06/07/2012   *RADIOLOGY REPORT*  Clinical Data: Left flank pain, gross hematuria  CT ABDOMEN AND PELVIS WITHOUT CONTRAST  Technique:  Multidetector CT imaging of the abdomen and pelvis was performed following the standard protocol without intravenous contrast.  Comparison: None.  Findings: Lung bases are unremarkable.  Sagittal images of the spine shows postsurgical changes at L4-L5 level.  Unenhanced liver, pancreas, spleen and adrenal glands are unremarkable.  No calcified gallstones are noted within gallbladder.  No aortic aneurysm.  Unenhanced kidneys are symmetrical in size.  No nephrolithiasis.  No hydronephrosis or hydroureter.  Probable cyst in mid pole of the left kidney anterior aspect measures 1 cm cannot be characterized without IV contrast. No small bowel obstruction.  No ascites or free air.  No adenopathy.  Bilateral no calcified ureteral calculi are noted. Stool noted within cecum.  No  pericecal inflammation.  Normal appendix partially visualized.  Bilateral distal ureter is unremarkable.  The urinary bladder is under distended without calcified calculi.  Small left hydrocele partially visualized.  Prostate gland and seminal vesicles are unremarkable.  IMPRESSION:  1.  No nephrolithiasis.  No hydronephrosis or hydroureter. 2.  No calcified ureteral calculi. 3.  No pericecal inflammation.  Normal appendix partially visualized. 4.  Postsurgical changes lumbar spine at L4-L5 level.   Original Report Authenticated By: Natasha Mead, M.D.     1. Hematuria       MDM  8:41 AM Urinalysis and labs pending. Vitals stable and patient afebrile. Patient declines pain medication at this time.   11:08 AM Urinalysis shows hemoglobin. Labs unremarkable. CT scan unremarkable for kidney stone. Patient will have urology follow up for hematuria. Vitals stable and patient afebrile. Patient instructed to return with worsening or concerning symptoms. No further evaluation needed at this time.       Emilia Beck, PA-C 06/07/12 1114

## 2012-06-08 LAB — URINE CULTURE
Colony Count: NO GROWTH
Culture: NO GROWTH
Special Requests: NORMAL

## 2012-06-08 NOTE — ED Provider Notes (Signed)
Medical screening examination/treatment/procedure(s) were performed by non-physician practitioner and as supervising physician I was immediately available for consultation/collaboration.   Eunie Lawn E Ariyanna Oien, MD 06/08/12 1343 

## 2012-06-09 ENCOUNTER — Encounter: Payer: Self-pay | Admitting: Family Medicine

## 2012-06-09 ENCOUNTER — Ambulatory Visit (INDEPENDENT_AMBULATORY_CARE_PROVIDER_SITE_OTHER): Payer: 59 | Admitting: Family Medicine

## 2012-06-09 VITALS — BP 140/90 | HR 98 | Temp 98.3°F | Wt 192.0 lb

## 2012-06-09 DIAGNOSIS — IMO0001 Reserved for inherently not codable concepts without codable children: Secondary | ICD-10-CM

## 2012-06-09 DIAGNOSIS — Z9109 Other allergy status, other than to drugs and biological substances: Secondary | ICD-10-CM

## 2012-06-09 DIAGNOSIS — R030 Elevated blood-pressure reading, without diagnosis of hypertension: Secondary | ICD-10-CM

## 2012-06-09 MED ORDER — METHYLPREDNISOLONE ACETATE 80 MG/ML IJ SUSP
120.0000 mg | Freq: Once | INTRAMUSCULAR | Status: AC
  Administered 2012-06-09: 120 mg via INTRAMUSCULAR

## 2012-06-09 NOTE — Addendum Note (Signed)
Addended by: Aniceto Boss A on: 06/09/2012 11:46 AM   Modules accepted: Orders

## 2012-06-09 NOTE — Progress Notes (Signed)
  Subjective:    Patient ID: Dale [name], male    DOB: 1960-12-06, 52 y.o.   MRN: [id number]  HPI Here with another flare of allergies. He has stuffy head, dry coughing, and PND. His also worried about his BP. For the past few months it has been steady around 140/90. He says he does not use much salt on his food. He admits to being under a lot of stress lately with family issues.    Review of Systems  Constitutional: Negative.   HENT: Positive for congestion, rhinorrhea, sneezing, postnasal drip and sinus pressure.   Eyes: Positive for itching.  Respiratory: Positive for cough.        Objective:   Physical Exam  Constitutional: He appears well-developed and well-nourished. No distress.  HENT:  Right Ear: External ear normal.  Left Ear: External ear normal.  Nose: Nose normal.  Mouth/Throat: Oropharynx is clear and moist.  Eyes: Conjunctivae are normal.  Neck: No thyromegaly present.  Pulmonary/Chest: Effort normal and breath sounds normal.  Lymphadenopathy:    He has no cervical adenopathy.          Assessment & Plan:  Given a steroid shot for the allergies. I suggested he get more exercise. We will monitor the BP closely

## 2012-06-16 ENCOUNTER — Other Ambulatory Visit: Payer: Self-pay | Admitting: Family Medicine

## 2012-08-17 ENCOUNTER — Other Ambulatory Visit: Payer: Self-pay | Admitting: Family Medicine

## 2012-10-17 ENCOUNTER — Telehealth: Payer: Self-pay | Admitting: Family Medicine

## 2012-10-17 NOTE — Telephone Encounter (Signed)
Refill request for Atorvastatin 10 mg, Viagra 100 mg, Lorazepam 0.5 mg and send a 90 day supply to [facility]. Can we refill these?

## 2012-10-17 NOTE — Telephone Encounter (Signed)
Refill Lipitor and Viagra for one year. Call in Lorazepam #[phone] with one rf

## 2012-10-18 MED ORDER — ATORVASTATIN CALCIUM 10 MG PO TABS: 10.0000 mg | ORAL_TABLET | Freq: Every day | ORAL | Status: DC

## 2012-10-18 MED ORDER — SILDENAFIL CITRATE 100 MG PO TABS: 100.0000 mg | ORAL_TABLET | Freq: Every day | ORAL | Status: DC | PRN

## 2012-10-18 MED ORDER — LORAZEPAM 0.5 MG PO TABS: 1.0000 mg | ORAL_TABLET | Freq: Four times a day (QID) | ORAL | Status: DC | PRN

## 2012-10-18 NOTE — Telephone Encounter (Signed)
All prescriptions sent or called in to pharmacy.

## 2012-11-24 ENCOUNTER — Other Ambulatory Visit: Payer: Self-pay

## 2013-01-04 ENCOUNTER — Other Ambulatory Visit: Payer: Self-pay | Admitting: Family Medicine

## 2013-01-05 ENCOUNTER — Encounter (HOSPITAL_COMMUNITY): Payer: Self-pay | Admitting: Pharmacy Technician

## 2013-01-05 ENCOUNTER — Encounter (HOSPITAL_COMMUNITY): Payer: Self-pay

## 2013-01-05 ENCOUNTER — Other Ambulatory Visit (HOSPITAL_COMMUNITY): Payer: Self-pay | Admitting: Orthopaedic Surgery

## 2013-01-05 ENCOUNTER — Encounter (HOSPITAL_COMMUNITY)
Admission: RE | Admit: 2013-01-05 | Discharge: 2013-01-05 | Disposition: A | Discharge status: Home / Self Care | Payer: 59 | Source: Ambulatory Visit | Attending: Orthopaedic Surgery | Admitting: Orthopaedic Surgery

## 2013-01-05 DIAGNOSIS — Z01812 Encounter for preprocedural laboratory examination: Secondary | ICD-10-CM | POA: Insufficient documentation

## 2013-01-05 HISTORY — DX: Anxiety disorder, unspecified: F41.9

## 2013-01-05 LAB — COMPREHENSIVE METABOLIC PANEL
ALT: 29 U/L (ref 0–53)
AST: 26 U/L (ref 0–37)
Albumin: 4.3 g/dL (ref 3.5–5.2)
Alkaline Phosphatase: 71 U/L (ref 39–117)
BUN: 17 mg/dL (ref 6–23)
CO2: 28 mEq/L (ref 19–32)
Calcium: 9.4 mg/dL (ref 8.4–10.5)
Chloride: 103 mEq/L (ref 96–112)
Creatinine, Ser: 0.91 mg/dL (ref 0.50–1.35)
GFR calc Af Amer: >90 mL/min (ref >90)
GFR calc non Af Amer: >90 mL/min (ref >90)
Glucose, Bld: 109 mg/dL — ABNORMAL HIGH (ref 70–99)
Potassium: 3.8 mEq/L (ref 3.5–5.1)
Sodium: 140 mEq/L (ref 135–145)
Total Bilirubin: 1.1 mg/dL (ref 0.3–1.2)
Total Protein: 7.3 g/dL (ref 6.0–8.3)

## 2013-01-05 LAB — CBC
HCT: 42.5 % (ref 39.0–52.0)
Hemoglobin: 15 g/dL (ref 13.0–17.0)
MCH: 31.4 pg (ref 26.0–34.0)
MCHC: 35.3 g/dL (ref 30.0–36.0)
MCV: 88.9 fL (ref 78.0–100.0)
Platelets: 169 10*3/uL (ref 150–400)
RBC: 4.78 MIL/uL (ref 4.22–5.81)
RDW: 13 % (ref 11.5–15.5)
WBC: 9.4 10*3/uL (ref 4.0–10.5)

## 2013-01-05 LAB — PROTIME-INR
INR: 0.94 (ref 0.00–1.49)
Prothrombin Time: 12.4 seconds (ref 11.6–15.2)

## 2013-01-05 NOTE — Pre-Procedure Instructions (Signed)
Dale [name]  01/05/2013   Your procedure is scheduled on:  Monday, December 22 @ 12:30  Report to [facility] Short Stay Entrance "A" at 1030 AM.  Call this number if you have problems the morning of surgery: [phone]   Remember:   Do not eat food or drink liquids after midnight. Sunday night   Take these medicines the morning of surgery with A SIP OF WATER: Omeprazole, Lorazepam if needed   Do not wear jewelry, make-up or nail polish.  Do not wear lotions, powders, or perfumes. You may wear deodorant.  Do not shave 48 hours prior to surgery. Men may shave face and neck.  Do not bring valuables to the hospital.  [facility] is not responsible for any belongings or valuables.               Contacts, dentures or bridgework may not be worn into surgery.  Leave suitcase in the car. After surgery it may be brought to your room.  For patients admitted to the hospital, discharge time is determined by your a  treatment team.               Patients discharged the day of surgery will not be allowed to drive  home.  Name and phone number of your driver: friend  Special Instructions: Shower using CHG 2 nights before surgery and the night before surgery.  If you shower the day of surgery use CHG.  Use special wash - you have one bottle of CHG for all showers.  You should use approximately 1/3 of the bottle for each shower.   Please read over the following fact sheets that you were given: Pain Booklet, Coughing and Deep Breathing and Surgical Site Infection Prevention

## 2013-01-08 MED ORDER — CEFAZOLIN SODIUM-DEXTROSE 2-3 GM-% IV SOLR
2.0000 g | INTRAVENOUS | Status: AC
  Administered 2013-01-09: 2 g via INTRAVENOUS
  Filled 2013-01-08: qty 50

## 2013-01-09 ENCOUNTER — Ambulatory Visit (HOSPITAL_COMMUNITY)
Admission: RE | Admit: 2013-01-09 | Discharge: 2013-01-09 | Disposition: A | Discharge status: Home / Self Care | Payer: 59 | Source: Ambulatory Visit | Attending: Orthopaedic Surgery | Admitting: Orthopaedic Surgery

## 2013-01-09 ENCOUNTER — Encounter (HOSPITAL_COMMUNITY): Payer: Self-pay | Admitting: *Deleted

## 2013-01-09 ENCOUNTER — Ambulatory Visit (HOSPITAL_COMMUNITY): Payer: 59 | Admitting: Anesthesiology

## 2013-01-09 ENCOUNTER — Encounter (HOSPITAL_COMMUNITY)
Admission: RE | Disposition: A | Discharge status: Home / Self Care | Payer: Self-pay | Source: Ambulatory Visit | Attending: Orthopaedic Surgery

## 2013-01-09 ENCOUNTER — Encounter (HOSPITAL_COMMUNITY): Payer: 59 | Admitting: Anesthesiology

## 2013-01-09 DIAGNOSIS — F3289 Other specified depressive episodes: Secondary | ICD-10-CM | POA: Insufficient documentation

## 2013-01-09 DIAGNOSIS — M2240 Chondromalacia patellae, unspecified knee: Secondary | ICD-10-CM | POA: Insufficient documentation

## 2013-01-09 DIAGNOSIS — M659 Unspecified synovitis and tenosynovitis, unspecified site: Secondary | ICD-10-CM | POA: Insufficient documentation

## 2013-01-09 DIAGNOSIS — K219 Gastro-esophageal reflux disease without esophagitis: Secondary | ICD-10-CM | POA: Insufficient documentation

## 2013-01-09 DIAGNOSIS — F329 Major depressive disorder, single episode, unspecified: Secondary | ICD-10-CM | POA: Insufficient documentation

## 2013-01-09 DIAGNOSIS — F411 Generalized anxiety disorder: Secondary | ICD-10-CM | POA: Insufficient documentation

## 2013-01-09 DIAGNOSIS — M23329 Other meniscus derangements, posterior horn of medial meniscus, unspecified knee: Secondary | ICD-10-CM | POA: Insufficient documentation

## 2013-01-09 DIAGNOSIS — Z79899 Other long term (current) drug therapy: Secondary | ICD-10-CM | POA: Insufficient documentation

## 2013-01-09 DIAGNOSIS — E785 Hyperlipidemia, unspecified: Secondary | ICD-10-CM | POA: Insufficient documentation

## 2013-01-09 DIAGNOSIS — S83241A Other tear of medial meniscus, current injury, right knee, initial encounter: Secondary | ICD-10-CM

## 2013-01-09 HISTORY — PX: KNEE ARTHROSCOPY: SHX127

## 2013-01-09 SURGERY — ARTHROSCOPY, KNEE
Anesthesia: General | Site: Knee | Laterality: Right

## 2013-01-09 MED ORDER — LACTATED RINGERS IV SOLN
INTRAVENOUS | Status: DC
  Administered 2013-01-09: 11:00:00 via INTRAVENOUS

## 2013-01-09 MED ORDER — ONDANSETRON HCL 4 MG/2ML IJ SOLN
INTRAMUSCULAR | Status: DC | PRN
  Administered 2013-01-09: 4 mg via INTRAVENOUS

## 2013-01-09 MED ORDER — PROPOFOL 10 MG/ML IV BOLUS
INTRAVENOUS | Status: DC | PRN
  Administered 2013-01-09: 200 mg via INTRAVENOUS

## 2013-01-09 MED ORDER — FENTANYL CITRATE 0.05 MG/ML IJ SOLN
INTRAMUSCULAR | Status: DC | PRN
  Administered 2013-01-09: 100 ug via INTRAVENOUS

## 2013-01-09 MED ORDER — BUPIVACAINE-EPINEPHRINE (PF) 0.25% -1:200000 IJ SOLN
INTRAMUSCULAR | Status: AC
  Filled 2013-01-09: qty 30

## 2013-01-09 MED ORDER — OXYCODONE-ACETAMINOPHEN 5-325 MG PO TABS: 1.0000 | ORAL_TABLET | ORAL | Status: DC | PRN

## 2013-01-09 MED ORDER — MIDAZOLAM HCL 2 MG/2ML IJ SOLN: 0.5000 mg | Freq: Once | INTRAMUSCULAR | Status: DC | PRN

## 2013-01-09 MED ORDER — MIDAZOLAM HCL 5 MG/5ML IJ SOLN
INTRAMUSCULAR | Status: DC | PRN
  Administered 2013-01-09: 2 mg via INTRAVENOUS

## 2013-01-09 MED ORDER — BUPIVACAINE-EPINEPHRINE 0.25% -1:200000 IJ SOLN
INTRAMUSCULAR | Status: DC | PRN
  Administered 2013-01-09: 14 mL

## 2013-01-09 MED ORDER — PROMETHAZINE HCL 25 MG/ML IJ SOLN: 6.2500 mg | INTRAMUSCULAR | Status: DC | PRN

## 2013-01-09 MED ORDER — SODIUM CHLORIDE 0.9 % IR SOLN
Status: DC | PRN
  Administered 2013-01-09: 9000 mL

## 2013-01-09 MED ORDER — OXYCODONE-ACETAMINOPHEN 5-325 MG PO TABS
1.0000 | ORAL_TABLET | ORAL | Status: DC | PRN
  Administered 2013-01-09: 2 via ORAL

## 2013-01-09 MED ORDER — OXYCODONE-ACETAMINOPHEN 5-325 MG PO TABS
ORAL_TABLET | ORAL | Status: AC
  Filled 2013-01-09: qty 2

## 2013-01-09 MED ORDER — MEPERIDINE HCL 25 MG/ML IJ SOLN: 6.2500 mg | INTRAMUSCULAR | Status: DC | PRN

## 2013-01-09 MED ORDER — FENTANYL CITRATE 0.05 MG/ML IJ SOLN
25.0000 ug | INTRAMUSCULAR | Status: DC | PRN
  Administered 2013-01-09 (×2): 50 ug via INTRAVENOUS

## 2013-01-09 MED ORDER — FENTANYL CITRATE 0.05 MG/ML IJ SOLN
INTRAMUSCULAR | Status: AC
  Administered 2013-01-09: 50 ug via INTRAVENOUS
  Filled 2013-01-09: qty 2

## 2013-01-09 MED ORDER — LIDOCAINE HCL (CARDIAC) 20 MG/ML IV SOLN
INTRAVENOUS | Status: DC | PRN
  Administered 2013-01-09: 100 mg via INTRAVENOUS

## 2013-01-09 SURGICAL SUPPLY — 31 items
BANDAGE ELASTIC 6 VELCRO ST LF (GAUZE/BANDAGES/DRESSINGS) ×2 IMPLANT
BENZOIN TINCTURE PRP APPL 2/3 (GAUZE/BANDAGES/DRESSINGS) ×2 IMPLANT
BLADE CUDA 5.5 (BLADE) IMPLANT
BLADE GREAT WHITE 4.2 (BLADE) ×2 IMPLANT
BOOTCOVER CLEANROOM LRG (PROTECTIVE WEAR) IMPLANT
BUR OVAL 6.0 (BURR) IMPLANT
CLOTH BEACON ORANGE TIMEOUT ST (SAFETY) IMPLANT
COVER SURGICAL LIGHT HANDLE (MISCELLANEOUS) ×2 IMPLANT
CUFF TOURNIQUET SINGLE 34IN LL (TOURNIQUET CUFF) IMPLANT
CUFF TOURNIQUET SINGLE 44IN (TOURNIQUET CUFF) IMPLANT
DRAPE ARTHROSCOPY W/POUCH 114 (DRAPES) ×2 IMPLANT
DRSG PAD ABDOMINAL 8X10 ST (GAUZE/BANDAGES/DRESSINGS) ×2 IMPLANT
DRSG TEGADERM 4X4.75 (GAUZE/BANDAGES/DRESSINGS) ×2 IMPLANT
DURAPREP 26ML APPLICATOR (WOUND CARE) ×2 IMPLANT
GAUZE XEROFORM 1X8 LF (GAUZE/BANDAGES/DRESSINGS) ×2 IMPLANT
GLOVE BIOGEL PI IND STRL 8 (GLOVE) ×1 IMPLANT
GLOVE BIOGEL PI INDICATOR 8 (GLOVE) ×1
GLOVE ORTHO TXT STRL SZ7.5 (GLOVE) ×2 IMPLANT
GOWN PREVENTION PLUS LG XLONG (DISPOSABLE) IMPLANT
GOWN STRL NON-REIN LRG LVL3 (GOWN DISPOSABLE) ×6 IMPLANT
KIT ROOM TURNOVER OR (KITS) ×2 IMPLANT
NEEDLE HYPO 25GX1X1/2 BEV (NEEDLE) IMPLANT
PACK ARTHROSCOPY DSU (CUSTOM PROCEDURE TRAY) ×2 IMPLANT
PAD ARMBOARD 7.5X6 YLW CONV (MISCELLANEOUS) ×4 IMPLANT
PADDING CAST COTTON 6X4 STRL (CAST SUPPLIES) ×2 IMPLANT
SET ARTHROSCOPY TUBING (MISCELLANEOUS) ×1
SET ARTHROSCOPY TUBING LN (MISCELLANEOUS) ×1 IMPLANT
SPONGE GAUZE 4X4 12PLY (GAUZE/BANDAGES/DRESSINGS) ×2 IMPLANT
SPONGE LAP 4X18 X RAY DECT (DISPOSABLE) ×2 IMPLANT
STRIP CLOSURE SKIN 1/2X4 (GAUZE/BANDAGES/DRESSINGS) ×2 IMPLANT
TOWEL OR 17X24 6PK STRL BLUE (TOWEL DISPOSABLE) ×4 IMPLANT

## 2013-01-09 NOTE — Anesthesia Procedure Notes (Signed)
Procedure Name: LMA Insertion Date/Time: 01/09/2013 12:51 PM Performed by: Marena Chancy Pre-anesthesia Checklist: Patient identified, Patient being monitored, Emergency Drugs available, Suction available and Timeout performed Patient Re-evaluated:Patient Re-evaluated prior to inductionOxygen Delivery Method: Circle system utilized Preoxygenation: Pre-oxygenation with 100% oxygen Intubation Type: IV induction LMA: LMA inserted LMA Size: 5.0 Number of attempts: 1 Placement Confirmation: breath sounds checked- equal and bilateral,  positive ETCO2 and CO2 detector Tube secured with: Tape Dental Injury: Teeth and Oropharynx as per pre-operative assessment

## 2013-01-09 NOTE — Preoperative (Signed)
Beta Blockers   Reason not to administer Beta Blockers:Not Applicable 

## 2013-01-09 NOTE — Interval H&P Note (Signed)
History and Physical Interval Note:  01/09/2013 12:32 PM  Dale [name]  has presented today for surgery, with the diagnosis of Right Knee Medial Meniscus Tear  The various methods of treatment have been discussed with the patient and family. After consideration of risks, benefits and other options for treatment, the patient has consented to  Procedure(s) with comments: ARTHROSCOPY KNEE (Right) - Right knee scope, partial medial menisectomy as a surgical intervention .  The patient's history has been reviewed, patient examined, no change in status, stable for surgery.  I have reviewed the patient's chart and labs.  Questions were answered to the patient's satisfaction.     Thiago Ragsdale C

## 2013-01-09 NOTE — Brief Op Note (Cosign Needed)
01/09/2013  1:45 PM  PATIENT:  Dale [name]  52 y.o. male  PRE-OPERATIVE DIAGNOSIS:  Right Knee Medial Meniscus Tear  POST-OPERATIVE DIAGNOSIS:  right knee medial meniscus tear  PROCEDURE:  Procedure(s) with comments: ARTHROSCOPY KNEE (Right) - Right knee scope, partial medial menisectomy  SURGEON:  Surgeon(s) and Role:    * Eldred Manges, MD - Primary  PHYSICIAN ASSISTANT:   ASSISTANTS: none   ANESTHESIA:   general  EBL:     BLOOD ADMINISTERED:none  DRAINS: none   LOCAL MEDICATIONS USED:  MARCAINE     SPECIMEN:  No Specimen  DISPOSITION OF SPECIMEN:  N/A  COUNTS:  YES  TOURNIQUET:  * No tourniquets in log *  DICTATION: .Note written in EPIC  PLAN OF CARE: Discharge to home after PACU  PATIENT DISPOSITION:  PACU - hemodynamically stable.   Delay start of Pharmacological VTE agent (>24hrs) due to surgical blood loss or risk of bleeding: no

## 2013-01-09 NOTE — Anesthesia Postprocedure Evaluation (Signed)
  Anesthesia Post-op Note  Patient: Dale [name]  Procedure(s) Performed: Procedure(s) with comments: ARTHROSCOPY KNEE (Right) - Right knee scope, partial medial menisectomy  Patient Location: PACU  Anesthesia Type:General  Level of Consciousness: awake  Airway and Oxygen Therapy: Patient Spontanous Breathing  Post-op Pain: mild  Post-op Assessment: Post-op Vital signs reviewed  Post-op Vital Signs: Reviewed  Complications: No apparent anesthesia complications

## 2013-01-09 NOTE — Anesthesia Preprocedure Evaluation (Addendum)
Anesthesia Evaluation  Patient identified by MRN, date of birth, ID band Patient awake    Reviewed: Allergy & Precautions, H&P , Patient's Chart, lab work & pertinent test results, reviewed documented beta blocker date and time   History of Anesthesia Complications Negative for: history of anesthetic complications  Airway Mallampati: II TM Distance: >3 FB Neck ROM: full    Dental   Pulmonary neg pulmonary ROS,  breath sounds clear to auscultation        Cardiovascular Exercise Tolerance: Good Rhythm:regular Rate:Normal     Neuro/Psych PSYCHIATRIC DISORDERS Anxiety Depression negative psych ROS   GI/Hepatic Neg liver ROS, GERD-  Controlled,  Endo/Other  negative endocrine ROS  Renal/GU negative Renal ROS     Musculoskeletal   Abdominal   Peds  Hematology   Anesthesia Other Findings   Reproductive/Obstetrics                          Anesthesia Physical Anesthesia Plan  ASA: II  Anesthesia Plan: General LMA and General   Post-op Pain Management:    Induction: Intravenous  Airway Management Planned: LMA  Additional Equipment:   Intra-op Plan:   Post-operative Plan: Extubation in OR  Informed Consent: I have reviewed the patients History and Physical, chart, labs and discussed the procedure including the risks, benefits and alternatives for the proposed anesthesia with the patient or authorized representative who has indicated his/her understanding and acceptance.   Dental Advisory Given and Dental advisory given  Plan Discussed with: CRNA, Surgeon and Anesthesiologist  Anesthesia Plan Comments:      Anesthesia Quick Evaluation

## 2013-01-09 NOTE — Transfer of Care (Signed)
Immediate Anesthesia Transfer of Care Note  Patient: Dale [name]  Procedure(s) Performed: Procedure(s) with comments: ARTHROSCOPY KNEE (Right) - Right knee scope, partial medial menisectomy  Patient Location: PACU  Anesthesia Type:General  Level of Consciousness: awake, alert  and oriented  Airway & Oxygen Therapy: Patient Spontanous Breathing and Patient connected to nasal cannula oxygen  Post-op Assessment: Report given to PACU RN and Post -op Vital signs reviewed and stable  Post vital signs: Reviewed and stable  Complications: No apparent anesthesia complications

## 2013-01-09 NOTE — H&P (Signed)
Dale [name] is an 52 y.o. male.   Chief Complaint: right knee MM tear by MRI with catching and locking HPI: right knee pain times more than one year with MRI documented MM tear posterior. Tx NSAIDS, intra articular injections with continued locking symptomes  Past Medical History  Diagnosis Date  . Allergy   . Depression   . Hyperlipidemia   . History of nephrolithiasis   . Chest pain, non-cardiac     normal stress test on 08-26-09   . GERD (gastroesophageal reflux disease)     TAKES MED  . Arthritis     "SOME IN BACK"  . Anxiety     Past Surgical History  Procedure Laterality Date  . Knee surgery  83, 89, 90, 01, 02    rt knee x 5  . Knee surgery  2007    left knee meniscus repair  . Rotator cuff repair  2003  . Posterior laminectomy / decompression lumbar spine  12-21-11    per Dr. Delma Officer   . Back surgery      Family History  Problem Relation Age of Onset  . Heart attack Father   . Coronary artery disease Father   . Depression      family hx  . Hyperlipidemia      family hx  . Hypertension      family hx  . Lung cancer      family hx  . Stroke      family hx   Social History:  reports that he has never smoked. He has never used smokeless tobacco. He reports that he does not use illicit drugs. His alcohol history is not on file.  Allergies: No Known Allergies  Medications Prior to Admission  Medication Sig Dispense Refill  . aspirin 81 MG tablet Take 81 mg by mouth daily. Every other day      . atorvastatin (LIPITOR) 10 MG tablet Take 10 mg by mouth every morning.      . diclofenac sodium (VOLTAREN) 1 % GEL Apply 1 application topically 2 (two) times daily as needed (for knee pain).       Marland Kitchen GLUCOSAMINE-CHONDROITIN PO Take 1 tablet by mouth daily.      Marland Kitchen loratadine-pseudoephedrine (CLARITIN-D 24-HOUR) 10-240 MG per 24 hr tablet Take 1 tablet by mouth daily.        Marland Kitchen LORazepam (ATIVAN) 0.5 MG tablet Take 2-4 tablets (1-2 mg total) by mouth every 6 (six)  hours as needed for anxiety.  180 tablet  1  . Multiple Vitamin (MULTIVITAMIN WITH MINERALS) TABS tablet Take 1 tablet by mouth daily.      Marland Kitchen omeprazole (PRILOSEC) 20 MG capsule Take 20 mg by mouth daily.      . sildenafil (VIAGRA) 100 MG tablet Take 50 mg by mouth daily as needed for erectile dysfunction.      Marland Kitchen VIAGRA 100 MG tablet TAKE 1 TABLET (100 MG TOTAL) BY MOUTH AS NEEDED FOR ERECTILE DYSFUNCTION.  10 tablet  9    No results found for this or any previous visit (from the past 48 hour(s)). No results found.  Review of Systems  Constitutional: Negative.   HENT: Negative.   Cardiovascular: Negative.   Musculoskeletal:       Right knee catching and locking  Skin: Negative.   Neurological: Negative.   Psychiatric/Behavioral: Positive for depression.    Blood pressure 143/93, pulse 75, temperature 98 F (36.7 C), temperature source Oral, resp. rate 20, SpO2 98.00%.  Physical Exam  Constitutional: He appears well-developed and well-nourished.  HENT:  Head: Normocephalic.  Eyes: Pupils are equal, round, and reactive to light.  Neck: Normal range of motion.  Cardiovascular: Normal rate.   Respiratory: Effort normal.  GI: Soft. Bowel sounds are normal.  Musculoskeletal:  Right knee mm tenderness, ACL PCL exam normal   Skin: Skin is warm and dry.  Psychiatric: He has a normal mood and affect. His behavior is normal. Judgment and thought content normal.     Assessment/Plan Right knee medial meniscal tear with chronic symptoms. For knee arthroscopy  Dale [name] C 01/09/2013, 12:29 PM

## 2013-01-10 ENCOUNTER — Encounter (HOSPITAL_COMMUNITY): Payer: Self-pay | Admitting: Orthopaedic Surgery

## 2013-01-10 NOTE — Op Note (Signed)
[name], [name] NO.:  [id number]  MEDICAL RECORD NO.:  [id number]  LOCATION:  [facility]                         FACILITY:  [facility]  PHYSICIAN:  Jayren Cease C. Ophelia Charter, M.D.    DATE OF BIRTH:  1960-07-25  DATE OF PROCEDURE:  01/09/2013 DATE OF DISCHARGE:  01/09/2013                              OPERATIVE REPORT   PREOPERATIVE DIAGNOSIS:  Right knee medial meniscal tear.  POSTOPERATIVE DIAGNOSES:  Right knee medial meniscal tear, medial compartment chondromalacia.  PROCEDURE:  Diagnostic and operative arthroscopy, right knee.  Partial medial meniscectomy.  SURGEON:  Dheeraj Hail C. Ophelia Charter, M.D.  ANESTHESIA:  MAC plus 20 mL Marcaine and local.  DRAINS:  None.  DESCRIPTION OF PROCEDURE:  After prepping and draping, this patient has had persistent medial joint line pain in the right knee, had a null patellar fracture years ago, treated with ORIF.  He has had persistent medial compartment pain.  MRI scan documented a meniscal tear.  He has had pain for greater than a year, failed anti-inflammatories, intra- articular injections.  He has had popping, catching, and locking with posterior medial pain and MRI scan documenting a posterior medial meniscal tear.  After prepping and draping, DuraPrep proximal leg holder usual impervious stockinette, Coban, arthroscopic sheets, drapes and pouch were applied.  Inflow was placed with a superolateral portal pump at 70. Medial and lateral parapatellar tendon portals were used.  Anterior midline incision from the old surgery was completely healed.  Suprapatellar pouch showed some mild changes of synovitis. Patellofemoral joint showed minimal wear.  Patellar tracking was normal. The lateral compartment showed normal lateral meniscus.  ACL and PCL were normal.  His medial compartment showed significant chondromalacia with some wear in the lateral tibial plateau posteriorly which was a grade 4 changes.  Inspection of the anterior portion of  the meniscus showed some mild fraying.  Posterior meniscus showed a complex tear with a flap tear which involved the inferior aspect of posterior meniscus extending from the posterior horn around 1/3rd of the radius of the medial meniscus posteriorly.  This was trimmed and debrided.  After I used a small thin straight baskets of 4.2 great White shaver was used for debridement of the meniscal fragments that had been morselized with a small straight basket.  Continued debridement and switching between the shaver and small basket was performed until the stable rim was present.  Chondroplasty was performed on the medial femoral condyle, some smoothing of the tibial surface, but the small area posterior and medial on the tibial plateau showed grade 4 changes without definite flap tears along the edge and although there was grade 4 changes, there was not much that could be done to trim this and improve it.  The lateral compartment showed intact meniscus, normal lateral femoral condyle and tibial plateau.  After thorough irrigation and aspiration, final probing of the meniscus to make sure that there were no remaining subluxing fragments.  The knee was aspirated and dry.  Tincture of benzoin, Steri- Strips, Tegaderm, Marcaine infiltration 20 mL 4x4s, ABD, Webril and 6- inch Ace wrap x2 was applied for postoperative dressing.  Followup in 1 week.     Loraine Leriche  Becky Sax, M.D.     MCY/MEDQ  D:  01/09/2013  T:  01/10/2013  Job:  [id number]

## 2013-02-10 ENCOUNTER — Ambulatory Visit (INDEPENDENT_AMBULATORY_CARE_PROVIDER_SITE_OTHER): Payer: 59 | Admitting: Family Medicine

## 2013-02-10 ENCOUNTER — Encounter: Payer: Self-pay | Admitting: Family Medicine

## 2013-02-10 VITALS — BP 138/84 | HR 100 | Temp 98.4°F | Ht 72.0 in | Wt 182.0 lb

## 2013-02-10 DIAGNOSIS — J209 Acute bronchitis, unspecified: Secondary | ICD-10-CM

## 2013-02-10 MED ORDER — AZITHROMYCIN 250 MG PO TABS: ORAL_TABLET | ORAL | Status: DC

## 2013-02-10 NOTE — Progress Notes (Signed)
   Subjective:    Patient ID: Dale [name], male    DOB: [birth year], 53 y.o.   MRN: [id number]  HPI Here for 5 days of low grade fevers, chest tightness, and a dry cough.    Review of Systems  Constitutional: Positive for fever.  HENT: Negative.   Eyes: Negative.   Respiratory: Positive for cough.        Objective:   Physical Exam  Constitutional: He appears well-developed and well-nourished.  HENT:  Right Ear: External ear normal.  Left Ear: External ear normal.  Nose: Nose normal.  Mouth/Throat: Oropharynx is clear and moist.  Eyes: Conjunctivae are normal.  Pulmonary/Chest: Effort normal. No respiratory distress. He has no wheezes. He has no rales.  Scattered rhonchi   Lymphadenopathy:    He has no cervical adenopathy.          Assessment & Plan:  Add Mucinex

## 2013-02-10 NOTE — Progress Notes (Signed)
Pre visit review using our clinic review tool, if applicable. No additional management support is needed unless otherwise documented below in the visit note. 

## 2013-03-09 ENCOUNTER — Encounter: Payer: Self-pay | Admitting: Internal Medicine

## 2013-03-28 ENCOUNTER — Other Ambulatory Visit: Payer: 59

## 2013-04-03 ENCOUNTER — Encounter: Payer: 59 | Admitting: Family Medicine

## 2013-04-12 ENCOUNTER — Other Ambulatory Visit: Payer: Self-pay | Admitting: Family Medicine

## 2013-04-14 DIAGNOSIS — Z0289 Encounter for other administrative examinations: Secondary | ICD-10-CM

## 2013-04-17 NOTE — Telephone Encounter (Signed)
Call in #[phone] with one rf  

## 2013-04-20 ENCOUNTER — Ambulatory Visit (INDEPENDENT_AMBULATORY_CARE_PROVIDER_SITE_OTHER): Payer: 59 | Admitting: Family Medicine

## 2013-04-20 ENCOUNTER — Ambulatory Visit (AMBULATORY_SURGERY_CENTER): Payer: Self-pay | Admitting: *Deleted

## 2013-04-20 VITALS — Ht 71.0 in | Wt 189.4 lb

## 2013-04-20 DIAGNOSIS — Z23 Encounter for immunization: Secondary | ICD-10-CM

## 2013-04-20 DIAGNOSIS — Z1211 Encounter for screening for malignant neoplasm of colon: Secondary | ICD-10-CM

## 2013-04-20 MED ORDER — PEG-KCL-NACL-NASULF-NA ASC-C 100 G PO SOLR: ORAL | Status: DC

## 2013-04-20 NOTE — Progress Notes (Signed)
No egg or soy allergy  No home oxygen use or problems with anesthesia

## 2013-04-24 ENCOUNTER — Encounter: Payer: Self-pay | Admitting: Internal Medicine

## 2013-05-02 ENCOUNTER — Ambulatory Visit (INDEPENDENT_AMBULATORY_CARE_PROVIDER_SITE_OTHER): Payer: 59 | Admitting: Family Medicine

## 2013-05-02 ENCOUNTER — Encounter: Payer: Self-pay | Admitting: Family Medicine

## 2013-05-02 VITALS — BP 156/94 | HR 96 | Temp 98.6°F | Ht 71.0 in | Wt 186.0 lb

## 2013-05-02 DIAGNOSIS — J309 Allergic rhinitis, unspecified: Secondary | ICD-10-CM

## 2013-05-02 DIAGNOSIS — A6000 Herpesviral infection of urogenital system, unspecified: Secondary | ICD-10-CM

## 2013-05-02 MED ORDER — METHYLPREDNISOLONE ACETATE 80 MG/ML IJ SUSP
120.0000 mg | Freq: Once | INTRAMUSCULAR | Status: AC
  Administered 2013-05-02: 120 mg via INTRAMUSCULAR

## 2013-05-02 NOTE — Progress Notes (Signed)
Pre visit review using our clinic review tool, if applicable. No additional management support is needed unless otherwise documented below in the visit note. 

## 2013-05-02 NOTE — Addendum Note (Signed)
Addended by: Colleen Can on: 05/02/2013 02:43 PM   Modules accepted: Orders

## 2013-05-02 NOTE — Progress Notes (Signed)
   Subjective:    Patient ID: Dale [name], male    DOB: [birth year], 53 y.o.   MRN: [id number]  HPI Here for his typical spring time allergies. He gets stuffy head, itchy eyes, sneezing and some coughing. On Zyrtec.    Review of Systems  Constitutional: Negative.   HENT: Positive for congestion and postnasal drip.   Eyes: Positive for itching. Negative for discharge and redness.  Respiratory: Positive for cough.        Objective:   Physical Exam  Constitutional: He appears well-developed and well-nourished.  HENT:  Right Ear: External ear normal.  Left Ear: External ear normal.  Nose: Nose normal.  Mouth/Throat: Oropharynx is clear and moist.  Eyes: Conjunctivae are normal.  Pulmonary/Chest: Effort normal and breath sounds normal.  Lymphadenopathy:    He has no cervical adenopathy.          Assessment & Plan:  Recheck prn

## 2013-05-04 ENCOUNTER — Ambulatory Visit (AMBULATORY_SURGERY_CENTER): Payer: 59 | Admitting: Internal Medicine

## 2013-05-04 ENCOUNTER — Encounter: Payer: Self-pay | Admitting: Internal Medicine

## 2013-05-04 VITALS — BP 123/85 | HR 75 | Temp 97.7°F | Resp 16 | Ht 71.0 in | Wt 189.0 lb

## 2013-05-04 DIAGNOSIS — D126 Benign neoplasm of colon, unspecified: Secondary | ICD-10-CM

## 2013-05-04 DIAGNOSIS — Z1211 Encounter for screening for malignant neoplasm of colon: Secondary | ICD-10-CM

## 2013-05-04 HISTORY — PX: COLONOSCOPY: SHX174

## 2013-05-04 MED ORDER — SODIUM CHLORIDE 0.9 % IV SOLN: 500.0000 mL | INTRAVENOUS | Status: DC

## 2013-05-04 NOTE — Progress Notes (Signed)
Report to pacu rn, vss, bbs=clear 

## 2013-05-04 NOTE — Op Note (Signed)
Agency  [employer]. [place], [id number]   COLONOSCOPY PROCEDURE REPORT  PATIENT: Dale [name], Dale [name]  MR#: [id number] BIRTHDATE: [birth year] , 53  yrs. old GENDER: Male ENDOSCOPIST: Jerene Bears, MD REFERRED BT:[id number] Sarajane Jews, M.D. PROCEDURE DATE:  05/04/2013 PROCEDURE:   Colonoscopy with cold biopsy polypectomy First Screening Colonoscopy - Avg.  risk and is 50 yrs.  old or older Yes.  Prior Negative Screening - Now for repeat screening. N/A  History of Adenoma - Now for follow-up colonoscopy & has been > or = to 3 yrs.  N/A  Polyps Removed Today? Yes. ASA CLASS:   Class II INDICATIONS:average risk screening and first colonoscopy. MEDICATIONS: MAC sedation, administered by CRNA and Propofol (Diprivan) 360 mg IV  DESCRIPTION OF PROCEDURE:   After the risks benefits and alternatives of the procedure were thoroughly explained, informed consent was obtained.  A digital rectal exam revealed no rectal mass.   The [id number]  endoscope was introduced through the anus and advanced to the cecum, which was identified by both the appendix and ileocecal valve. No adverse events experienced. The quality of the prep was good, using MoviPrep  The instrument was then slowly withdrawn as the colon was fully examined.  COLON FINDINGS: A sessile polyp measuring 2-3 mm in size was found in the sigmoid colon.  A polypectomy was performed with cold forceps.  The resection was complete and the polyp tissue was completely retrieved.   The colon mucosa was otherwise normal. Retroflexed views revealed no abnormalities. The time to cecum=3 minutes 54 seconds.  Withdrawal time=12 minutes 55 seconds.  The scope was withdrawn and the procedure completed . COMPLICATIONS: There were no complications.  ENDOSCOPIC IMPRESSION: 1.   Sessile polyp measuring 2-3 mm in size was found in the sigmoid colon; polypectomy was performed with cold forceps 2.   The colon mucosa was otherwise  normal  RECOMMENDATIONS: 1.  Await pathology results 2.  If the polyp removed today is proven to be an adenomatous (pre-cancerous) polyp, you will need a repeat colonoscopy in 5 years.  Otherwise you should continue to follow colorectal cancer screening guidelines for "routine risk" patients with colonoscopy in 10 years.  You will receive a letter within 1-2 weeks with the results of your biopsy as well as final recommendations.  Please call my office if you have not received a letter after 3 weeks.   eSigned:  Jerene Bears, MD 05/04/2013 12:01 PM  cc: The Patient and Laurey Morale, MD

## 2013-05-04 NOTE — Progress Notes (Signed)
Called to room to assist during endoscopic procedure.  Patient ID and intended procedure confirmed with present staff. Received instructions for my participation in the procedure from the performing physician.  

## 2013-05-04 NOTE — Patient Instructions (Signed)
YOU HAD AN ENDOSCOPIC PROCEDURE TODAY AT [facility]: Refer to the procedure report that was given to you for any specific questions about what was found during the examination.  If the procedure report does not answer your questions, please call your gastroenterologist to clarify.  If you requested that your care partner not be given the details of your procedure findings, then the procedure report has been included in a sealed envelope for you to review at your convenience later.  YOU SHOULD EXPECT: Some feelings of bloating in the abdomen. Passage of more gas than usual.  Walking can help get rid of the air that was put into your GI tract during the procedure and reduce the bloating. If you had a lower endoscopy (such as a colonoscopy or flexible sigmoidoscopy) you may notice spotting of blood in your stool or on the toilet paper. If you underwent a bowel prep for your procedure, then you may not have a normal bowel movement for a few days.  DIET: Your first meal following the procedure should be a light meal and then it is ok to progress to your normal diet.  A half-sandwich or bowl of soup is an example of a good first meal.  Heavy or fried foods are harder to digest and may make you feel nauseous or bloated.  Likewise meals heavy in dairy and vegetables can cause extra gas to form and this can also increase the bloating.  Drink plenty of fluids but you should avoid alcoholic beverages for 24 hours.  ACTIVITY: Your care partner should take you home directly after the procedure.  You should plan to take it easy, moving slowly for the rest of the day.  You can resume normal activity the day after the procedure however you should NOT DRIVE or use heavy machinery for 24 hours (because of the sedation medicines used during the test).    SYMPTOMS TO REPORT IMMEDIATELY: A gastroenterologist can be reached at any hour.  During normal business hours, 8:30 AM to 5:00 PM Monday through Friday,  call [phone].  After hours and on weekends, please call the GI answering service at [phone] who will take a message and have the physician on call contact you.  Read your handout on polyps.   Following lower endoscopy (colonoscopy or flexible sigmoidoscopy):  Excessive amounts of blood in the stool  Significant tenderness or worsening of abdominal pains  Swelling of the abdomen that is new, acute  Fever of 100F or higher  FOLLOW UP: If any biopsies were taken you will be contacted by phone or by letter within the next 1-3 weeks.  Call your gastroenterologist if you have not heard about the biopsies in 3 weeks.  Our staff will call the home number listed on your records the next business day following your procedure to check on you and address any questions or concerns that you may have at that time regarding the information given to you following your procedure. This is a courtesy call and so if there is no answer at the home number and we have not heard from you through the emergency physician on call, we will assume that you have returned to your regular daily activities without incident.  SIGNATURES/CONFIDENTIALITY: You and/or your care partner have signed paperwork which will be entered into your electronic medical record.  These signatures attest to the fact that that the information above on your After Visit Summary has been reviewed and is understood.  Full  responsibility of the confidentiality of this discharge information lies with you and/or your care-partner.

## 2013-05-05 ENCOUNTER — Telehealth: Payer: Self-pay | Admitting: *Deleted

## 2013-05-05 NOTE — Telephone Encounter (Signed)
  Follow up Call-  Call back number 05/04/2013  Post procedure Call Back phone  # [phone]  Permission to leave phone message Yes     Patient questions:  Do you have a fever, pain , or abdominal swelling? no Pain Score  0 *  Have you tolerated food without any problems? yes  Have you been able to return to your normal activities? yes  Do you have any questions about your discharge instructions: Diet   no Medications  no Follow up visit  no  Do you have questions or concerns about your Care? no  Actions: * If pain score is 4 or above: No action needed, pain <4.

## 2013-05-09 ENCOUNTER — Encounter: Payer: Self-pay | Admitting: Internal Medicine

## 2013-05-22 ENCOUNTER — Encounter: Payer: Self-pay | Admitting: *Deleted

## 2013-05-22 ENCOUNTER — Other Ambulatory Visit (INDEPENDENT_AMBULATORY_CARE_PROVIDER_SITE_OTHER): Payer: 59

## 2013-05-22 DIAGNOSIS — Z0000 Encounter for general adult medical examination without abnormal findings: Secondary | ICD-10-CM

## 2013-05-22 LAB — CBC WITH DIFFERENTIAL/PLATELET
Basophils Absolute: 0 10*3/uL (ref 0.0–0.1)
Basophils Relative: 0.4 % (ref 0.0–3.0)
Eosinophils Absolute: 0.1 10*3/uL (ref 0.0–0.7)
Eosinophils Relative: 1.5 % (ref 0.0–5.0)
HCT: 45.6 % (ref 39.0–52.0)
Hemoglobin: 15.3 g/dL (ref 13.0–17.0)
Lymphocytes Relative: 21.8 % (ref 12.0–46.0)
Lymphs Abs: 1.9 10*3/uL (ref 0.7–4.0)
MCHC: 33.5 g/dL (ref 30.0–36.0)
MCV: 92.2 fl (ref 78.0–100.0)
Monocytes Absolute: 0.7 10*3/uL (ref 0.1–1.0)
Monocytes Relative: 8 % (ref 3.0–12.0)
Neutro Abs: 5.8 10*3/uL (ref 1.4–7.7)
Neutrophils Relative %: 68.3 % (ref 43.0–77.0)
Platelets: 186 10*3/uL (ref 150.0–400.0)
RBC: 4.94 Mil/uL (ref 4.22–5.81)
RDW: 14.3 % (ref 11.5–14.6)
WBC: 8.5 10*3/uL (ref 4.5–10.5)

## 2013-05-22 LAB — POCT URINALYSIS DIPSTICK
Bilirubin, UA: NEGATIVE
Glucose, UA: NEGATIVE
Ketones, UA: NEGATIVE
Leukocytes, UA: NEGATIVE
Nitrite, UA: NEGATIVE
Protein, UA: NEGATIVE
Spec Grav, UA: 1.02
Urobilinogen, UA: 0.2
pH, UA: 5.5

## 2013-05-22 LAB — BASIC METABOLIC PANEL
BUN: 15 mg/dL (ref 6–23)
CO2: 25 mEq/L (ref 19–32)
Calcium: 9.5 mg/dL (ref 8.4–10.5)
Chloride: 105 mEq/L (ref 96–112)
Creatinine, Ser: 0.8 mg/dL (ref 0.4–1.5)
GFR: 108.2 mL/min (ref >60.00)
Glucose, Bld: 93 mg/dL (ref 70–99)
Potassium: 3.7 mEq/L (ref 3.5–5.1)
Sodium: 140 mEq/L (ref 135–145)

## 2013-05-22 LAB — HEPATIC FUNCTION PANEL
ALT: 31 U/L (ref 0–53)
AST: 23 U/L (ref 0–37)
Albumin: 4.5 g/dL (ref 3.5–5.2)
Alkaline Phosphatase: 72 U/L (ref 39–117)
Bilirubin, Direct: 0.3 mg/dL (ref 0.0–0.3)
Total Bilirubin: 2.4 mg/dL — ABNORMAL HIGH (ref 0.3–1.2)
Total Protein: 7.1 g/dL (ref 6.0–8.3)

## 2013-05-22 LAB — PSA: PSA: 0.92 ng/mL (ref 0.10–4.00)

## 2013-05-22 LAB — LIPID PANEL
Cholesterol: 169 mg/dL (ref 0–200)
HDL: 59.4 mg/dL (ref >39.00)
LDL Cholesterol: 90 mg/dL (ref 0–99)
Total CHOL/HDL Ratio: 3
Triglycerides: 100 mg/dL (ref 0.0–149.0)
VLDL: 20 mg/dL (ref 0.0–40.0)

## 2013-05-22 LAB — TSH: TSH: 1.55 u[IU]/mL (ref 0.35–5.50)

## 2013-05-29 ENCOUNTER — Ambulatory Visit (INDEPENDENT_AMBULATORY_CARE_PROVIDER_SITE_OTHER): Payer: 59 | Admitting: Family Medicine

## 2013-05-29 ENCOUNTER — Encounter: Payer: Self-pay | Admitting: Family Medicine

## 2013-05-29 VITALS — BP 142/95 | HR 96 | Temp 98.8°F | Ht 71.0 in | Wt 182.0 lb

## 2013-05-29 DIAGNOSIS — Z9109 Other allergy status, other than to drugs and biological substances: Secondary | ICD-10-CM

## 2013-05-29 DIAGNOSIS — E785 Hyperlipidemia, unspecified: Secondary | ICD-10-CM

## 2013-05-29 DIAGNOSIS — Z0000 Encounter for general adult medical examination without abnormal findings: Secondary | ICD-10-CM

## 2013-05-29 MED ORDER — LORAZEPAM 2 MG PO TABS: 2.0000 mg | ORAL_TABLET | Freq: Four times a day (QID) | ORAL | Status: DC | PRN

## 2013-05-29 MED ORDER — METHYLPREDNISOLONE ACETATE 80 MG/ML IJ SUSP
120.0000 mg | Freq: Once | INTRAMUSCULAR | Status: AC
  Administered 2013-05-29: 120 mg via INTRAMUSCULAR

## 2013-05-29 MED ORDER — ATORVASTATIN CALCIUM 10 MG PO TABS: 10.0000 mg | ORAL_TABLET | Freq: Every morning | ORAL | Status: DC

## 2013-05-29 NOTE — Addendum Note (Signed)
Addended by: Aggie Hacker A on: 05/29/2013 05:22 PM   Modules accepted: Orders

## 2013-05-29 NOTE — Progress Notes (Signed)
   Subjective:    Patient ID: Dale [name], male    DOB: [birth year], 53 y.o.   MRN: [id number]  HPI 31 yr ol dmale for a cpx. He feels well except for some allergy issues. He felt well after a steroid shot here last month but the sx returned. Otherwise he is  Scheduled for a right total knee replacement per Dr. Wynelle Link on 07-10-13.    Review of Systems  Constitutional: Negative.   HENT: Negative.   Eyes: Negative.   Respiratory: Negative.   Cardiovascular: Negative.   Gastrointestinal: Negative.   Genitourinary: Negative.   Musculoskeletal: Negative.   Skin: Negative.   Neurological: Negative.   Psychiatric/Behavioral: Negative.        Objective:   Physical Exam  Constitutional: He is oriented to person, place, and time. He appears well-developed and well-nourished. No distress.  HENT:  Head: Normocephalic and atraumatic.  Right Ear: External ear normal.  Left Ear: External ear normal.  Nose: Nose normal.  Mouth/Throat: Oropharynx is clear and moist. No oropharyngeal exudate.  Eyes: Conjunctivae and EOM are normal. Pupils are equal, round, and reactive to light. Right eye exhibits no discharge. Left eye exhibits no discharge. No scleral icterus.  Neck: Neck supple. No JVD present. No tracheal deviation present. No thyromegaly present.  Cardiovascular: Normal rate, regular rhythm, normal heart sounds and intact distal pulses.  Exam reveals no gallop and no friction rub.   No murmur heard. EKG normal   Pulmonary/Chest: Effort normal and breath sounds normal. No respiratory distress. He has no wheezes. He has no rales. He exhibits no tenderness.  Abdominal: Soft. Bowel sounds are normal. He exhibits no distension and no mass. There is no tenderness. There is no rebound and no guarding.  Genitourinary: Rectum normal, prostate normal and penis normal. Guaiac negative stool. No penile tenderness.  Musculoskeletal: Normal range of motion. He exhibits no edema and no tenderness.    Lymphadenopathy:    He has no cervical adenopathy.  Neurological: He is alert and oriented to person, place, and time. He has normal reflexes. No cranial nerve deficit. He exhibits normal muscle tone. Coordination normal.  Skin: Skin is warm and dry. No rash noted. He is not diaphoretic. No erythema. No pallor.  Psychiatric: He has a normal mood and affect. His behavior is normal. Judgment and thought content normal.          Assessment & Plan:  Well exam. He is cleared for surgery.

## 2013-05-29 NOTE — Progress Notes (Signed)
Pre visit review using our clinic review tool, if applicable. No additional management support is needed unless otherwise documented below in the visit note. 

## 2013-06-13 ENCOUNTER — Encounter: Payer: Self-pay | Admitting: Family Medicine

## 2013-06-23 ENCOUNTER — Other Ambulatory Visit: Payer: Self-pay | Admitting: Orthopedic Surgery

## 2013-06-30 ENCOUNTER — Encounter: Payer: Self-pay | Admitting: Family Medicine

## 2013-06-30 ENCOUNTER — Ambulatory Visit (INDEPENDENT_AMBULATORY_CARE_PROVIDER_SITE_OTHER): Payer: 59 | Admitting: Family Medicine

## 2013-06-30 VITALS — BP 133/80 | HR 99 | Temp 99.2°F | Ht 71.0 in | Wt 180.0 lb

## 2013-06-30 DIAGNOSIS — J019 Acute sinusitis, unspecified: Secondary | ICD-10-CM

## 2013-06-30 MED ORDER — AZITHROMYCIN 250 MG PO TABS: ORAL_TABLET | ORAL | Status: DC

## 2013-06-30 NOTE — Progress Notes (Signed)
   Subjective:    Patient ID: Dale [name], male    DOB: 28-Nov-1960, 53 y.o.   MRN: [id number]  HPI Here for 2 weeks of sinus pressure, PND, ST , and a dry cough. Using Nyquil.   Review of Systems  Constitutional: Negative.   HENT: Positive for congestion, postnasal drip and sinus pressure.   Eyes: Negative.   Respiratory: Positive for cough.        Objective:   Physical Exam  Constitutional: He appears well-developed and well-nourished.  HENT:  Right Ear: External ear normal.  Left Ear: External ear normal.  Nose: Nose normal.  Mouth/Throat: Oropharynx is clear and moist.  Eyes: Conjunctivae are normal.  Pulmonary/Chest: Effort normal and breath sounds normal.  Lymphadenopathy:    He has no cervical adenopathy.          Assessment & Plan:  Add Mucinex.

## 2013-06-30 NOTE — Progress Notes (Signed)
Pre visit review using our clinic review tool, if applicable. No additional management support is needed unless otherwise documented below in the visit note. 

## 2013-07-03 ENCOUNTER — Encounter (HOSPITAL_COMMUNITY): Payer: Self-pay | Admitting: Pharmacy Technician

## 2013-07-04 ENCOUNTER — Ambulatory Visit (INDEPENDENT_AMBULATORY_CARE_PROVIDER_SITE_OTHER): Payer: 59 | Admitting: Family Medicine

## 2013-07-04 ENCOUNTER — Encounter: Payer: Self-pay | Admitting: Family Medicine

## 2013-07-04 VITALS — BP 150/90 | Temp 98.8°F | Ht 71.0 in | Wt 184.0 lb

## 2013-07-04 DIAGNOSIS — J019 Acute sinusitis, unspecified: Secondary | ICD-10-CM

## 2013-07-04 MED ORDER — LEVOFLOXACIN 500 MG PO TABS: 500.0000 mg | ORAL_TABLET | Freq: Every day | ORAL | Status: AC

## 2013-07-04 MED ORDER — VALACYCLOVIR HCL 500 MG PO TABS: 500.0000 mg | ORAL_TABLET | Freq: Every day | ORAL | Status: DC

## 2013-07-04 NOTE — Progress Notes (Signed)
   Subjective:    Patient ID: Dale [name], male    DOB: May 06, 1960, 53 y.o.   MRN: [id number]  HPI Here for continuing symptoms of PND and coughing up yellow sputum. He was here on 06-30-13 and was given a Zpack, but this has not helped. No fever.    Review of Systems  Constitutional: Negative.   HENT: Positive for congestion and postnasal drip. Negative for sinus pressure.   Eyes: Negative.   Respiratory: Positive for cough.        Objective:   Physical Exam  Constitutional: He appears well-developed and well-nourished.  HENT:  Right Ear: External ear normal.  Left Ear: External ear normal.  Nose: Nose normal.  Mouth/Throat: Oropharynx is clear and moist.  Eyes: Conjunctivae are normal.  Pulmonary/Chest: Effort normal and breath sounds normal.  Lymphadenopathy:    He has no cervical adenopathy.          Assessment & Plan:  Partially treated sinusitis. Switch to Levaquin for 10 days

## 2013-07-04 NOTE — Progress Notes (Signed)
Pre visit review using our clinic review tool, if applicable. No additional management support is needed unless otherwise documented below in the visit note. 

## 2013-07-06 ENCOUNTER — Ambulatory Visit (HOSPITAL_COMMUNITY)
Admission: RE | Admit: 2013-07-06 | Discharge: 2013-07-06 | Disposition: A | Discharge status: Home / Self Care | Payer: 59 | Source: Ambulatory Visit | Attending: Anesthesiology | Admitting: Anesthesiology

## 2013-07-06 ENCOUNTER — Encounter (HOSPITAL_COMMUNITY): Payer: Self-pay

## 2013-07-06 ENCOUNTER — Encounter (HOSPITAL_COMMUNITY)
Admission: RE | Admit: 2013-07-06 | Discharge: 2013-07-06 | Disposition: A | Discharge status: Home / Self Care | Payer: 59 | Source: Ambulatory Visit | Attending: Orthopedic Surgery | Admitting: Orthopedic Surgery

## 2013-07-06 DIAGNOSIS — Z01812 Encounter for preprocedural laboratory examination: Secondary | ICD-10-CM | POA: Insufficient documentation

## 2013-07-06 DIAGNOSIS — Z01818 Encounter for other preprocedural examination: Secondary | ICD-10-CM | POA: Insufficient documentation

## 2013-07-06 DIAGNOSIS — R05 Cough: Secondary | ICD-10-CM | POA: Insufficient documentation

## 2013-07-06 DIAGNOSIS — K219 Gastro-esophageal reflux disease without esophagitis: Secondary | ICD-10-CM | POA: Insufficient documentation

## 2013-07-06 DIAGNOSIS — R059 Cough, unspecified: Secondary | ICD-10-CM | POA: Insufficient documentation

## 2013-07-06 LAB — URINALYSIS, ROUTINE W REFLEX MICROSCOPIC
Bilirubin Urine: NEGATIVE
Glucose, UA: NEGATIVE mg/dL
Hgb urine dipstick: NEGATIVE
Ketones, ur: NEGATIVE mg/dL
Leukocytes, UA: NEGATIVE
Nitrite: NEGATIVE
Protein, ur: NEGATIVE mg/dL
Specific Gravity, Urine: 1.024 (ref 1.005–1.030)
Urobilinogen, UA: 0.2 mg/dL (ref 0.0–1.0)
pH: 5.5 (ref 5.0–8.0)

## 2013-07-06 LAB — COMPREHENSIVE METABOLIC PANEL
ALT: 34 U/L (ref 0–53)
AST: 24 U/L (ref 0–37)
Albumin: 4.2 g/dL (ref 3.5–5.2)
Alkaline Phosphatase: 83 U/L (ref 39–117)
BUN: 16 mg/dL (ref 6–23)
CO2: 29 mEq/L (ref 19–32)
Calcium: 9.8 mg/dL (ref 8.4–10.5)
Chloride: 101 mEq/L (ref 96–112)
Creatinine, Ser: 0.91 mg/dL (ref 0.50–1.35)
GFR calc Af Amer: >90 mL/min (ref >90)
GFR calc non Af Amer: >90 mL/min (ref >90)
Glucose, Bld: 98 mg/dL (ref 70–99)
Potassium: 5 mEq/L (ref 3.7–5.3)
Sodium: 140 mEq/L (ref 137–147)
Total Bilirubin: 1.1 mg/dL (ref 0.3–1.2)
Total Protein: 7.6 g/dL (ref 6.0–8.3)

## 2013-07-06 LAB — PROTIME-INR
INR: 1 (ref 0.00–1.49)
Prothrombin Time: 13 seconds (ref 11.6–15.2)

## 2013-07-06 LAB — CBC
HCT: 44.5 % (ref 39.0–52.0)
Hemoglobin: 15.3 g/dL (ref 13.0–17.0)
MCH: 31.5 pg (ref 26.0–34.0)
MCHC: 34.4 g/dL (ref 30.0–36.0)
MCV: 91.6 fL (ref 78.0–100.0)
Platelets: 187 10*3/uL (ref 150–400)
RBC: 4.86 MIL/uL (ref 4.22–5.81)
RDW: 13.1 % (ref 11.5–15.5)
WBC: 9.5 10*3/uL (ref 4.0–10.5)

## 2013-07-06 LAB — APTT: aPTT: 26 seconds (ref 24–37)

## 2013-07-06 LAB — SURGICAL PCR SCREEN
MRSA, PCR: NEGATIVE
Staphylococcus aureus: NEGATIVE

## 2013-07-06 NOTE — Patient Instructions (Addendum)
Dale [name]  07/06/2013                           YOUR PROCEDURE IS SCHEDULED ON: 6/22/154 AT 11:35 AM               ENTER THRU [facility] ENTRANCE AND                            FOLLOW  SIGNS TO SHORT STAY CENTER                 ARRIVE AT SHORT STAY AT: 8:30 AM               CALL THIS NUMBER IF ANY PROBLEMS THE DAY OF SURGERY :               [phone]                                REMEMBER:   Do not eat food or drink liquids AFTER MIDNIGHT                 Take these medicines the morning of surgery with               A SIPS OF WATER :   ATORAVASTIN / OMEPRAZOLE / VALACYCLOVIR / LEVOQUIN / MAY TAKE LORAZEPAM IF NEEDED      Do not wear jewelry, make-up   Do not wear lotions, powders, or perfumes.   Do not shave legs or underarms 12 hrs. before surgery (men may shave face)  Do not bring valuables to the hospital.  Contacts, dentures or bridgework may not be worn into surgery.  Leave suitcase in the car. After surgery it may be brought to your room.  For patients admitted to the hospital more than one night, checkout time is            11:00 AM                                                         ________________________________________________________________________                                                                                                            [place]  Before surgery, you can play an important role.  Because skin is not sterile, your skin needs to be as free of germs as possible.  You can reduce the number of germs on your skin by washing with CHG (chlorahexidine gluconate) soap before surgery.  CHG is an antiseptic cleaner which kills germs and bonds with the skin to continue killing germs even after washing. Please DO NOT use if you have  an allergy to CHG or antibacterial soaps.  If your skin becomes reddened/irritated stop using the CHG and inform your nurse when you arrive at Short  Stay. Do not shave (including legs and underarms) for at least 48 hours prior to the first CHG shower.  You may shave your face. Please follow these instructions carefully:   1.  Shower with CHG Soap the night before surgery and the  morning of Surgery.   2.  If you choose to wash your hair, wash your hair first as usual with your  normal  Shampoo.   3.  After you shampoo, rinse your hair and body thoroughly to remove the  shampoo.                                         4.  Use CHG as you would any other liquid soap.  You can apply chg directly  to the skin and wash . Gently wash with scrungie or clean wascloth    5.  Apply the CHG Soap to your body ONLY FROM THE NECK DOWN.   Do not use on open                           Wound or open sores. Avoid contact with eyes, ears mouth and genitals (private parts).                        Genitals (private parts) with your normal soap.              6.  Wash thoroughly, paying special attention to the area where your surgery  will be performed.   7.  Thoroughly rinse your body with warm water from the neck down.   8.  DO NOT shower/wash with your normal soap after using and rinsing off  the CHG Soap .                9.  Pat yourself dry with a clean towel.             10.  Wear clean pajamas.             11.  Place clean sheets on your bed the night of your first shower and do not  sleep with pets.  Day of Surgery : Do not apply any lotions/deodorants the morning of surgery.  Please wear clean clothes to the hospital/surgery center.  FAILURE TO FOLLOW THESE INSTRUCTIONS MAY RESULT IN THE CANCELLATION OF YOUR SURGERY    PATIENT SIGNATURE_________________________________     Incentive Spirometer  An incentive spirometer is a tool that can help keep your lungs clear and active. This tool measures how well you are filling your lungs with each breath. Taking long deep breaths may help reverse or decrease the chance of developing breathing  (pulmonary) problems (especially infection) following:  A long period of time when you are unable to move or be active. BEFORE THE PROCEDURE   If the spirometer includes an indicator to show your best effort, your nurse or respiratory therapist will set it to a desired goal.  If possible, sit up straight or lean slightly forward. Try not to slouch.  Hold the incentive spirometer in an upright position. INSTRUCTIONS FOR USE  1. Sit on the edge of your bed if possible, or sit  up as far as you can in bed or on a chair. 2. Hold the incentive spirometer in an upright position. 3. Breathe out normally. 4. Place the mouthpiece in your mouth and seal your lips tightly around it. 5. Breathe in slowly and as deeply as possible, raising the piston or the ball toward the top of the column. 6. Hold your breath for 3-5 seconds or for as long as possible. Allow the piston or ball to fall to the bottom of the column. 7. Remove the mouthpiece from your mouth and breathe out normally. 8. Rest for a few seconds and repeat Steps 1 through 7 at least 10 times every 1-2 hours when you are awake. Take your time and take a few normal breaths between deep breaths. 9. The spirometer may include an indicator to show your best effort. Use the indicator as a goal to work toward during each repetition. 10. After each set of 10 deep breaths, practice coughing to be sure your lungs are clear. If you have an incision (the cut made at the time of surgery), support your incision when coughing by placing a pillow or rolled up towels firmly against it. Once you are able to get out of bed, walk around indoors and cough well. You may stop using the incentive spirometer when instructed by your caregiver.  RISKS AND COMPLICATIONS  Take your time so you do not get dizzy or light-headed.  If you are in pain, you may need to take or ask for pain medication before doing incentive spirometry. It is harder to take a deep breath if you  are having pain. AFTER USE  Rest and breathe slowly and easily.  It can be helpful to keep track of a log of your progress. Your caregiver can provide you with a simple table to help with this. If you are using the spirometer at home, follow these instructions: [place] IF:   You are having difficultly using the spirometer.  You have trouble using the spirometer as often as instructed.  Your pain medication is not giving enough relief while using the spirometer.  You develop fever of 100.5 F (38.1 C) or higher. SEEK IMMEDIATE MEDICAL CARE IF:   You cough up bloody sputum that had not been present before.  You develop fever of 102 F (38.9 C) or greater.  You develop worsening pain at or near the incision site. MAKE SURE YOU:   Understand these instructions.  Will watch your condition.  Will get help right away if you are not doing well or get worse. Document Released: 05/18/2006 Document Revised: 03/30/2011 Document Reviewed: 07/19/2006 Exit[employer] Patient Information 2014 Exit[employer], [place].   ________________________________________________________________________  WHAT IS A BLOOD TRANSFUSION? Blood Transfusion Information  A transfusion is the replacement of blood or some of its parts. Blood is made up of multiple cells which provide different functions.  Red blood cells carry oxygen and are used for blood loss replacement.  White blood cells fight against infection.  Platelets control bleeding.  Plasma helps clot blood.  Other blood products are available for specialized needs, such as hemophilia or other clotting disorders. BEFORE THE TRANSFUSION  Who gives blood for transfusions?   Healthy volunteers who are fully evaluated to make sure their blood is safe. This is blood bank blood. Transfusion therapy is the safest it has ever been in the practice of medicine. Before blood is taken from a donor, a complete history is taken to make sure that person has  no history of diseases nor engages in risky social behavior (examples are intravenous drug use or sexual activity with multiple partners). The donor's travel history is screened to minimize risk of transmitting infections, such as malaria. The donated blood is tested for signs of infectious diseases, such as HIV and hepatitis. The blood is then tested to be sure it is compatible with you in order to minimize the chance of a transfusion reaction. If you or a relative donates blood, this is often done in anticipation of surgery and is not appropriate for emergency situations. It takes many days to process the donated blood. RISKS AND COMPLICATIONS Although transfusion therapy is very safe and saves many lives, the main dangers of transfusion include:   Getting an infectious disease.  Developing a transfusion reaction. This is an allergic reaction to something in the blood you were given. Every precaution is taken to prevent this. The decision to have a blood transfusion has been considered carefully by your caregiver before blood is given. Blood is not given unless the benefits outweigh the risks. AFTER THE TRANSFUSION  Right after receiving a blood transfusion, you will usually feel much better and more energetic. This is especially true if your red blood cells have gotten low (anemic). The transfusion raises the level of the red blood cells which carry oxygen, and this usually causes an energy increase.  The nurse administering the transfusion will monitor you carefully for complications. HOME CARE INSTRUCTIONS  No special instructions are needed after a transfusion. You may find your energy is better. Speak with your caregiver about any limitations on activity for underlying diseases you may have. SEEK MEDICAL CARE IF:   Your condition is not improving after your transfusion.  You develop redness or irritation at the intravenous (IV) site. SEEK IMMEDIATE MEDICAL CARE IF:  Any of the following  symptoms occur over the next 12 hours:  Shaking chills.  You have a temperature by mouth above 102 F (38.9 C), not controlled by medicine.  Chest, back, or muscle pain.  People around you feel you are not acting correctly or are confused.  Shortness of breath or difficulty breathing.  Dizziness and fainting.  You get a rash or develop hives.  You have a decrease in urine output.  Your urine turns a dark color or changes to pink, red, or Emory Leaver. Any of the following symptoms occur over the next 10 days:  You have a temperature by mouth above 102 F (38.9 C), not controlled by medicine.  Shortness of breath.  Weakness after normal activity.  The white part of the eye turns yellow (jaundice).  You have a decrease in the amount of urine or are urinating less often.  Your urine turns a dark color or changes to pink, red, or Arlis Yale. Document Released: 01/03/2000 Document Revised: 03/30/2011 Document Reviewed: 08/22/2007 [facility] Patient Information 2014 [place], [place].  _______________________________________________________________________

## 2013-07-07 NOTE — Progress Notes (Signed)
Left message on home phone of time change for surgery and to report to short stay at 0730 07/10/13

## 2013-07-09 ENCOUNTER — Other Ambulatory Visit: Payer: Self-pay | Admitting: Orthopedic Surgery

## 2013-07-09 NOTE — H&P (Signed)
Dale [name] DOB: 22-Feb-1960 Divorced / Language: [name] / Race: White Male  Date of Admission:  07-10-2013  Chief Complaint:  Right Knee Pain  History of Present Illness The patient is a 53 year old male who comes in for a preoperative History and Physical. The patient is scheduled for a right total knee arthroplasty to be performed by Dr. Dione Plover. Aluisio, MD at [facility] on 07-10-2013. The patient reports right knee symptoms including: pain which began 30 year(s) ago following a specific injury (Patient states that his right knee started to hurt on the medial side about 15yr ago. He said that his knee pain depends on how much he is on it. He is currently taking ibuprofen and that seems to help. He has had cortisone injections in the past and that also helps. He has had multiple surgeries on this knee in the past for different injuries and his last surgery on his knee was in December for an arthroscope.).The patient feels that the symptoms are worsening. He states that his right knee is now hurting at all times. He recently had an arthroscopy by Dr. Lorin Mercy in December 2014 and did not get better. He says that the pain is slightly better. He is not getting real sharp pain but it is still limiting what he can and cannot do. He was told by Dr. Lorin Mercy that he is a candidate for a unicompartmental replacement.  He states that he is tired of the way the knee is hurting him and the way it is limiting what he can and cannot do. He has had injections in the past, including cortisone and has not had viscosupplements recently. It's getting harder to stand at work because of his knee. He is not having any hip pain, lower extremity weakness or paraesthesia at this time. He is ready to get the knee fixed. They have been treated conservatively in the past for the above stated problem and despite conservative measures, they continue to have progressive pain and severe functional limitations and  dysfunction. They have failed non-operative management including home exercise, medications, and injections. It is felt that they would benefit from undergoing total joint replacement. Risks and benefits of the procedure have been discussed with the patient and they elect to proceed with surgery. There are no active contraindications to surgery such as ongoing infection or rapidly progressive neurological disease.   Allergies No Known Drug Allergies   Problem List/Past Medical Primary osteoarthritis of one knee (715.16  M17.10) Kidney Stone Hypercholesterolemia Anxiety Disorder Gastroesophageal Reflux Disease Depression    Family History Cerebrovascular Accident. grandmother fathers side Cancer. grandmother mothers side Depression. father Congestive Heart Failure. father Osteoarthritis. mother Heart Disease. father Diabetes Mellitus. grandmother fathers side Hypertension. mother and grandmother mothers side Heart disease in male family member before age 47    Social History Drug/Alcohol Rehab (Currently). no Current work status. working full time Exercise. Exercises weekly; does running / walking and gym / weights Drug/Alcohol Rehab (Previously). no Children. 1 Alcohol use. current drinker; drinks beer, wine and hard liquor; only occasionally per week Illicit drug use. no Tobacco use. never smoker Tobacco / smoke exposure. no Marital status. divorced Living situation. live alone Pain Contract. no Number of flights of stairs before winded. greater than 5    Medication History Glucosamine Chondr 1500 Complx ( Oral) Specific dose unknown - Active. Atorvastatin Calcium (10MG  Tablet, Oral) Active. LORazepam (0.5MG  Tablet, Oral) Active. Claritin ( Oral) Specific dose unknown - Active. Viagra (100MG  Tablet, Oral)  Active. Multiple Vitamin ( Oral) Active.   Past Surgical History Spinal Fusion. lower back Rotator Cuff Repair.  left Vasectomy Spinal Surgery Right Knee Surgery (6 times) Left Knee Surgery   Review of Systems(Alexzandrew L Perkins, III PA-C; 07/06/2013 3:46 PM) General:Not Present- Chills, Fever, Night Sweats, Fatigue, Weight Gain, Weight Loss and Memory Loss. Skin:Not Present- Hives, Itching, Rash, Eczema and Lesions. HEENT:Not Present- Tinnitus, Headache, Double Vision, Visual Loss, Hearing Loss and Dentures. Respiratory:Present- Cough (recent cold but treated with ABX) and Sputum Production (with recent cold but improved). Not Present- Shortness of breath with exertion, Shortness of breath at rest, Allergies, Coughing up blood and Chronic Cough. Cardiovascular:Not Present- Chest Pain, Racing/skipping heartbeats, Difficulty Breathing Lying Down, Murmur, Swelling and Palpitations. Gastrointestinal:Not Present- Bloody Stool, Heartburn, Abdominal Pain, Vomiting, Nausea, Constipation, Diarrhea, Difficulty Swallowing, Jaundice and Loss of appetitie. Male Genitourinary:Not Present- Urinary frequency, Blood in Urine, Weak urinary stream, Discharge, Flank Pain, Incontinence, Painful Urination, Urgency, Urinary Retention and Urinating at Night. Musculoskeletal:Not Present- Muscle Weakness, Muscle Pain, Joint Swelling, Joint Pain, Back Pain, Morning Stiffness and Spasms. Neurological:Not Present- Tremor, Dizziness, Blackout spells, Paralysis, Difficulty with balance and Weakness. Psychiatric:Not Present- Insomnia.    Vitals Weight: 180 lb Height: 71 in Body Surface Area: 2.02 m Body Mass Index: 25.1 kg/m BP: 132/92 (Sitting, Left Arm, Standard)     Physical Exam The physical exam findings are as follows:   General Mental Status - Alert, cooperative and good historian. General Appearance- pleasant. Not in acute distress. Orientation- Oriented X3. Build & Nutrition- Well nourished and Well developed.   Head and Neck Head- normocephalic, atraumatic . Neck Global  Assessment- supple. no bruit auscultated on the right and no bruit auscultated on the left.   Eye Pupil- Bilateral- Regular and Round. Motion- Bilateral- EOMI.   Chest and Lung Exam Auscultation: Breath sounds:- clear at anterior chest wall and - clear at posterior chest wall. Adventitious sounds:- No Adventitious sounds.   Cardiovascular Auscultation:Rhythm- Regular rate and rhythm. Heart Sounds- S1 WNL and S2 WNL. Murmurs & Other Heart Sounds:Auscultation of the heart reveals - No Murmurs.   Abdomen Palpation/Percussion:Tenderness- Abdomen is non-tender to palpation. Rigidity (guarding)- Abdomen is soft. Auscultation:Auscultation of the abdomen reveals - Bowel sounds normal.   Male Genitourinary Not done, not pertinent to present illness  Musculoskeletal Musculoskeletal: Evaluation of his hips show normal range of motion, no discomfort. His left knee exam is normal. His right knee shows no effusion. He has got slight varus. His range is about 5 to 125. There is moderate crepitus on range of motion. He is tender medially, with no lateral tenderness or instability noted. Pulse, sensation, and motor are intact.  X-RAYS: AP both knees and lateral show near bone on bone arthritis in the medial compartment of the right knee with significant patellofemoral narrowing also.   Assessment & Plan Primary osteoarthritis of one knee (715.16  M17.10) Impression: Right Knee  Note: Plan is for a Right Total Knee Replacement by Dr. Wynelle Link.  Plan is to go home.  PCP - Dr. Sarajane Jews - Patient has been seen preoperatively and felt to be stable for surgery.  The patient does not have any contraindications and will receive TXA (tranexamic acid) prior to surgery.  Signed electronically by Joelene Millin, III PA-C

## 2013-07-10 ENCOUNTER — Encounter (HOSPITAL_COMMUNITY): Payer: Self-pay | Admitting: *Deleted

## 2013-07-10 ENCOUNTER — Inpatient Hospital Stay (HOSPITAL_COMMUNITY): Payer: 59 | Admitting: Anesthesiology

## 2013-07-10 ENCOUNTER — Encounter (HOSPITAL_COMMUNITY)
Admission: RE | Disposition: A | Discharge status: Home Health | Payer: Self-pay | Source: Ambulatory Visit | Attending: Orthopedic Surgery

## 2013-07-10 ENCOUNTER — Inpatient Hospital Stay (HOSPITAL_COMMUNITY)
Admission: RE | Admit: 2013-07-10 | Discharge: 2013-07-12 | DRG: 470 | Disposition: A | Discharge status: Home Health | Payer: 59 | Source: Ambulatory Visit | Attending: Orthopedic Surgery | Admitting: Orthopedic Surgery

## 2013-07-10 ENCOUNTER — Encounter (HOSPITAL_COMMUNITY): Payer: 59 | Admitting: Anesthesiology

## 2013-07-10 DIAGNOSIS — Z981 Arthrodesis status: Secondary | ICD-10-CM

## 2013-07-10 DIAGNOSIS — E785 Hyperlipidemia, unspecified: Secondary | ICD-10-CM

## 2013-07-10 DIAGNOSIS — D62 Acute posthemorrhagic anemia: Secondary | ICD-10-CM | POA: Diagnosis not present

## 2013-07-10 DIAGNOSIS — F3289 Other specified depressive episodes: Secondary | ICD-10-CM

## 2013-07-10 DIAGNOSIS — K219 Gastro-esophageal reflux disease without esophagitis: Secondary | ICD-10-CM | POA: Diagnosis present

## 2013-07-10 DIAGNOSIS — J019 Acute sinusitis, unspecified: Secondary | ICD-10-CM

## 2013-07-10 DIAGNOSIS — IMO0002 Reserved for concepts with insufficient information to code with codable children: Secondary | ICD-10-CM

## 2013-07-10 DIAGNOSIS — J329 Chronic sinusitis, unspecified: Secondary | ICD-10-CM | POA: Diagnosis present

## 2013-07-10 DIAGNOSIS — E7800 Pure hypercholesterolemia, unspecified: Secondary | ICD-10-CM | POA: Diagnosis present

## 2013-07-10 DIAGNOSIS — M179 Osteoarthritis of knee, unspecified: Secondary | ICD-10-CM | POA: Diagnosis present

## 2013-07-10 DIAGNOSIS — J309 Allergic rhinitis, unspecified: Secondary | ICD-10-CM

## 2013-07-10 DIAGNOSIS — F329 Major depressive disorder, single episode, unspecified: Secondary | ICD-10-CM

## 2013-07-10 DIAGNOSIS — F411 Generalized anxiety disorder: Secondary | ICD-10-CM | POA: Diagnosis present

## 2013-07-10 DIAGNOSIS — M1711 Unilateral primary osteoarthritis, right knee: Secondary | ICD-10-CM

## 2013-07-10 DIAGNOSIS — Z96651 Presence of right artificial knee joint: Secondary | ICD-10-CM

## 2013-07-10 DIAGNOSIS — M1710 Unilateral primary osteoarthritis, unspecified knee: Secondary | ICD-10-CM | POA: Diagnosis present

## 2013-07-10 HISTORY — PX: TOTAL KNEE ARTHROPLASTY: SHX125

## 2013-07-10 LAB — TYPE AND SCREEN
ABO/RH(D): A NEG
Antibody Screen: NEGATIVE

## 2013-07-10 LAB — ABO/RH: ABO/RH(D): A NEG

## 2013-07-10 SURGERY — ARTHROPLASTY, KNEE, TOTAL
Anesthesia: Spinal | Site: Knee | Laterality: Right

## 2013-07-10 MED ORDER — LACTATED RINGERS IV SOLN: INTRAVENOUS | Status: DC

## 2013-07-10 MED ORDER — MORPHINE SULFATE 2 MG/ML IJ SOLN
1.0000 mg | INTRAMUSCULAR | Status: DC | PRN
  Administered 2013-07-10 – 2013-07-12 (×8): 2 mg via INTRAVENOUS
  Filled 2013-07-10 (×8): qty 1

## 2013-07-10 MED ORDER — CEFAZOLIN SODIUM-DEXTROSE 2-3 GM-% IV SOLR
INTRAVENOUS | Status: AC
  Filled 2013-07-10: qty 50

## 2013-07-10 MED ORDER — ATORVASTATIN CALCIUM 10 MG PO TABS
10.0000 mg | ORAL_TABLET | Freq: Every day | ORAL | Status: DC
  Administered 2013-07-11: 10 mg via ORAL
  Filled 2013-07-10 (×2): qty 1

## 2013-07-10 MED ORDER — SODIUM CHLORIDE 0.9 % IJ SOLN
INTRAMUSCULAR | Status: DC | PRN
  Administered 2013-07-10: 30 mL

## 2013-07-10 MED ORDER — MENTHOL 3 MG MT LOZG: 1.0000 | LOZENGE | OROMUCOSAL | Status: DC | PRN

## 2013-07-10 MED ORDER — OXYCODONE HCL 5 MG PO TABS
5.0000 mg | ORAL_TABLET | ORAL | Status: DC | PRN
  Administered 2013-07-10 – 2013-07-11 (×5): 10 mg via ORAL
  Filled 2013-07-10 (×5): qty 2

## 2013-07-10 MED ORDER — CEFAZOLIN SODIUM-DEXTROSE 2-3 GM-% IV SOLR
2.0000 g | INTRAVENOUS | Status: AC
  Administered 2013-07-10: 2 g via INTRAVENOUS

## 2013-07-10 MED ORDER — KETOROLAC TROMETHAMINE 15 MG/ML IJ SOLN
7.5000 mg | Freq: Four times a day (QID) | INTRAMUSCULAR | Status: AC | PRN
  Administered 2013-07-10: 7.5 mg via INTRAVENOUS
  Filled 2013-07-10: qty 1

## 2013-07-10 MED ORDER — PHENOL 1.4 % MT LIQD: 1.0000 | OROMUCOSAL | Status: DC | PRN

## 2013-07-10 MED ORDER — LORATADINE 10 MG PO TABS
10.0000 mg | ORAL_TABLET | Freq: Every day | ORAL | Status: DC
  Administered 2013-07-11 – 2013-07-12 (×2): 10 mg via ORAL
  Filled 2013-07-10 (×2): qty 1

## 2013-07-10 MED ORDER — DEXTROSE-NACL 5-0.9 % IV SOLN
INTRAVENOUS | Status: DC
  Administered 2013-07-10 – 2013-07-11 (×2): via INTRAVENOUS

## 2013-07-10 MED ORDER — PROPOFOL 10 MG/ML IV BOLUS
INTRAVENOUS | Status: AC
  Filled 2013-07-10: qty 20

## 2013-07-10 MED ORDER — BUPIVACAINE LIPOSOME 1.3 % IJ SUSP
INTRAMUSCULAR | Status: DC | PRN
  Administered 2013-07-10: 20 mL

## 2013-07-10 MED ORDER — FENTANYL CITRATE 0.05 MG/ML IJ SOLN
INTRAMUSCULAR | Status: DC | PRN
  Administered 2013-07-10: 50 ug via INTRAVENOUS

## 2013-07-10 MED ORDER — CEFAZOLIN SODIUM-DEXTROSE 2-3 GM-% IV SOLR
2.0000 g | Freq: Four times a day (QID) | INTRAVENOUS | Status: AC
  Administered 2013-07-10 (×2): 2 g via INTRAVENOUS
  Filled 2013-07-10 (×2): qty 50

## 2013-07-10 MED ORDER — TRAMADOL HCL 50 MG PO TABS
50.0000 mg | ORAL_TABLET | Freq: Four times a day (QID) | ORAL | Status: DC | PRN
  Administered 2013-07-11: 100 mg via ORAL
  Filled 2013-07-10: qty 2

## 2013-07-10 MED ORDER — BUPIVACAINE LIPOSOME 1.3 % IJ SUSP
20.0000 mL | Freq: Once | INTRAMUSCULAR | Status: DC
  Filled 2013-07-10: qty 20

## 2013-07-10 MED ORDER — PROPOFOL 10 MG/ML IV BOLUS
INTRAVENOUS | Status: DC | PRN
  Administered 2013-07-10 (×2): 20 mg via INTRAVENOUS

## 2013-07-10 MED ORDER — DIPHENHYDRAMINE HCL 12.5 MG/5ML PO ELIX: 12.5000 mg | ORAL_SOLUTION | ORAL | Status: DC | PRN

## 2013-07-10 MED ORDER — ACETAMINOPHEN 10 MG/ML IV SOLN
1000.0000 mg | Freq: Once | INTRAVENOUS | Status: DC
  Filled 2013-07-10: qty 100

## 2013-07-10 MED ORDER — DOCUSATE SODIUM 100 MG PO CAPS
100.0000 mg | ORAL_CAPSULE | Freq: Two times a day (BID) | ORAL | Status: DC
  Administered 2013-07-10 – 2013-07-12 (×4): 100 mg via ORAL
  Filled 2013-07-10 (×5): qty 1

## 2013-07-10 MED ORDER — PROPOFOL INFUSION 10 MG/ML OPTIME
INTRAVENOUS | Status: DC | PRN
  Administered 2013-07-10: 75 ug/kg/min via INTRAVENOUS

## 2013-07-10 MED ORDER — LORAZEPAM 1 MG PO TABS: 2.0000 mg | ORAL_TABLET | Freq: Two times a day (BID) | ORAL | Status: DC | PRN

## 2013-07-10 MED ORDER — DEXTROSE 5 % IV SOLN
500.0000 mg | Freq: Four times a day (QID) | INTRAVENOUS | Status: DC | PRN
  Administered 2013-07-10: 500 mg via INTRAVENOUS
  Filled 2013-07-10: qty 5

## 2013-07-10 MED ORDER — RIVAROXABAN 10 MG PO TABS
10.0000 mg | ORAL_TABLET | Freq: Every day | ORAL | Status: DC
  Administered 2013-07-11 – 2013-07-12 (×2): 10 mg via ORAL
  Filled 2013-07-10 (×3): qty 1

## 2013-07-10 MED ORDER — LEVOFLOXACIN 500 MG PO TABS
500.0000 mg | ORAL_TABLET | Freq: Every day | ORAL | Status: DC
  Administered 2013-07-11 – 2013-07-12 (×2): 500 mg via ORAL
  Filled 2013-07-10 (×2): qty 1

## 2013-07-10 MED ORDER — MIDAZOLAM HCL 5 MG/5ML IJ SOLN
INTRAMUSCULAR | Status: DC | PRN
  Administered 2013-07-10: 2 mg via INTRAVENOUS

## 2013-07-10 MED ORDER — ACETAMINOPHEN 500 MG PO TABS
1000.0000 mg | ORAL_TABLET | Freq: Four times a day (QID) | ORAL | Status: AC
  Administered 2013-07-10 – 2013-07-11 (×4): 1000 mg via ORAL
  Filled 2013-07-10 (×4): qty 2

## 2013-07-10 MED ORDER — METHOCARBAMOL 500 MG PO TABS
500.0000 mg | ORAL_TABLET | Freq: Four times a day (QID) | ORAL | Status: DC | PRN
  Administered 2013-07-10 – 2013-07-12 (×5): 500 mg via ORAL
  Filled 2013-07-10 (×5): qty 1

## 2013-07-10 MED ORDER — CHLORHEXIDINE GLUCONATE 4 % EX LIQD: 60.0000 mL | Freq: Once | CUTANEOUS | Status: DC

## 2013-07-10 MED ORDER — ACETAMINOPHEN 325 MG PO TABS
650.0000 mg | ORAL_TABLET | Freq: Four times a day (QID) | ORAL | Status: DC | PRN
  Administered 2013-07-12: 650 mg via ORAL
  Filled 2013-07-10: qty 2

## 2013-07-10 MED ORDER — DEXAMETHASONE 6 MG PO TABS
10.0000 mg | ORAL_TABLET | Freq: Every day | ORAL | Status: AC
  Administered 2013-07-11: 10 mg via ORAL
  Filled 2013-07-10: qty 1

## 2013-07-10 MED ORDER — ACETAMINOPHEN 10 MG/ML IV SOLN
INTRAVENOUS | Status: DC | PRN
  Administered 2013-07-10: 1000 mg via INTRAVENOUS

## 2013-07-10 MED ORDER — MIDAZOLAM HCL 2 MG/2ML IJ SOLN
INTRAMUSCULAR | Status: AC
  Filled 2013-07-10: qty 2

## 2013-07-10 MED ORDER — FENTANYL CITRATE 0.05 MG/ML IJ SOLN
INTRAMUSCULAR | Status: AC
  Filled 2013-07-10: qty 2

## 2013-07-10 MED ORDER — HYDROMORPHONE HCL PF 1 MG/ML IJ SOLN: 0.2500 mg | INTRAMUSCULAR | Status: DC | PRN

## 2013-07-10 MED ORDER — SODIUM CHLORIDE 0.9 % IR SOLN
Status: DC | PRN
  Administered 2013-07-10: 1000 mL

## 2013-07-10 MED ORDER — PSEUDOEPHEDRINE HCL ER 120 MG PO TB12
120.0000 mg | ORAL_TABLET | Freq: Two times a day (BID) | ORAL | Status: DC
  Administered 2013-07-11 (×2): 120 mg via ORAL
  Filled 2013-07-10 (×4): qty 1

## 2013-07-10 MED ORDER — LORAZEPAM 1 MG PO TABS
2.0000 mg | ORAL_TABLET | Freq: Two times a day (BID) | ORAL | Status: DC | PRN
  Administered 2013-07-10: 2 mg via ORAL
  Filled 2013-07-10: qty 2

## 2013-07-10 MED ORDER — PANTOPRAZOLE SODIUM 40 MG PO TBEC
40.0000 mg | DELAYED_RELEASE_TABLET | Freq: Every day | ORAL | Status: DC
  Filled 2013-07-10: qty 1

## 2013-07-10 MED ORDER — SODIUM CHLORIDE 0.9 % IV SOLN: INTRAVENOUS | Status: DC

## 2013-07-10 MED ORDER — ONDANSETRON HCL 4 MG/2ML IJ SOLN
INTRAMUSCULAR | Status: DC | PRN
  Administered 2013-07-10: 4 mg via INTRAVENOUS

## 2013-07-10 MED ORDER — MEPERIDINE HCL 50 MG/ML IJ SOLN: 6.2500 mg | INTRAMUSCULAR | Status: DC | PRN

## 2013-07-10 MED ORDER — ONDANSETRON HCL 4 MG PO TABS: 4.0000 mg | ORAL_TABLET | Freq: Four times a day (QID) | ORAL | Status: DC | PRN

## 2013-07-10 MED ORDER — TRANEXAMIC ACID 100 MG/ML IV SOLN
1000.0000 mg | INTRAVENOUS | Status: AC
  Administered 2013-07-10: 1000 mg via INTRAVENOUS
  Filled 2013-07-10: qty 10

## 2013-07-10 MED ORDER — LACTATED RINGERS IV SOLN
INTRAVENOUS | Status: DC
  Administered 2013-07-10: 12:00:00 via INTRAVENOUS
  Administered 2013-07-10: 1000 mL via INTRAVENOUS
  Administered 2013-07-10: 11:00:00 via INTRAVENOUS

## 2013-07-10 MED ORDER — POLYETHYLENE GLYCOL 3350 17 G PO PACK
17.0000 g | PACK | Freq: Every day | ORAL | Status: DC | PRN
  Filled 2013-07-10: qty 1

## 2013-07-10 MED ORDER — ACETAMINOPHEN 650 MG RE SUPP: 650.0000 mg | Freq: Four times a day (QID) | RECTAL | Status: DC | PRN

## 2013-07-10 MED ORDER — METOCLOPRAMIDE HCL 5 MG/ML IJ SOLN: 5.0000 mg | Freq: Three times a day (TID) | INTRAMUSCULAR | Status: DC | PRN

## 2013-07-10 MED ORDER — METOCLOPRAMIDE HCL 10 MG PO TABS: 5.0000 mg | ORAL_TABLET | Freq: Three times a day (TID) | ORAL | Status: DC | PRN

## 2013-07-10 MED ORDER — BUPIVACAINE HCL 0.25 % IJ SOLN
INTRAMUSCULAR | Status: DC | PRN
  Administered 2013-07-10: 20 mL

## 2013-07-10 MED ORDER — 0.9 % SODIUM CHLORIDE (POUR BTL) OPTIME
TOPICAL | Status: DC | PRN
  Administered 2013-07-10: 1000 mL

## 2013-07-10 MED ORDER — ONDANSETRON HCL 4 MG/2ML IJ SOLN: 4.0000 mg | Freq: Four times a day (QID) | INTRAMUSCULAR | Status: DC | PRN

## 2013-07-10 MED ORDER — SODIUM CHLORIDE 0.9 % IJ SOLN
INTRAMUSCULAR | Status: AC
  Filled 2013-07-10: qty 50

## 2013-07-10 MED ORDER — BISACODYL 10 MG RE SUPP: 10.0000 mg | Freq: Every day | RECTAL | Status: DC | PRN

## 2013-07-10 MED ORDER — DEXAMETHASONE SODIUM PHOSPHATE 10 MG/ML IJ SOLN
INTRAMUSCULAR | Status: DC | PRN
  Administered 2013-07-10: 10 mg via INTRAVENOUS

## 2013-07-10 MED ORDER — BUPIVACAINE HCL (PF) 0.25 % IJ SOLN
INTRAMUSCULAR | Status: AC
  Filled 2013-07-10: qty 30

## 2013-07-10 MED ORDER — VALACYCLOVIR HCL 500 MG PO TABS
500.0000 mg | ORAL_TABLET | Freq: Every day | ORAL | Status: DC
  Administered 2013-07-11 – 2013-07-12 (×2): 500 mg via ORAL
  Filled 2013-07-10 (×2): qty 1

## 2013-07-10 MED ORDER — DEXAMETHASONE SODIUM PHOSPHATE 10 MG/ML IJ SOLN
INTRAMUSCULAR | Status: AC
  Filled 2013-07-10: qty 1

## 2013-07-10 MED ORDER — DEXAMETHASONE SODIUM PHOSPHATE 10 MG/ML IJ SOLN: 10.0000 mg | Freq: Once | INTRAMUSCULAR | Status: DC

## 2013-07-10 MED ORDER — LORATADINE-PSEUDOEPHEDRINE ER 10-240 MG PO TB24: 1.0000 | ORAL_TABLET | Freq: Every day | ORAL | Status: DC

## 2013-07-10 MED ORDER — FLEET ENEMA 7-19 GM/118ML RE ENEM: 1.0000 | ENEMA | Freq: Once | RECTAL | Status: AC | PRN

## 2013-07-10 MED ORDER — PROMETHAZINE HCL 25 MG/ML IJ SOLN: 6.2500 mg | INTRAMUSCULAR | Status: DC | PRN

## 2013-07-10 MED ORDER — DEXAMETHASONE SODIUM PHOSPHATE 10 MG/ML IJ SOLN
10.0000 mg | Freq: Every day | INTRAMUSCULAR | Status: AC
  Filled 2013-07-10: qty 1

## 2013-07-10 MED ORDER — ONDANSETRON HCL 4 MG/2ML IJ SOLN
INTRAMUSCULAR | Status: AC
  Filled 2013-07-10: qty 2

## 2013-07-10 SURGICAL SUPPLY — 58 items
BAG ZIPLOCK 12X15 (MISCELLANEOUS) IMPLANT
BANDAGE ELASTIC 6 VELCRO ST LF (GAUZE/BANDAGES/DRESSINGS) ×2 IMPLANT
BANDAGE ESMARK 6X9 LF (GAUZE/BANDAGES/DRESSINGS) ×1 IMPLANT
BLADE SAG 18X100X1.27 (BLADE) ×2 IMPLANT
BLADE SAW SGTL 11.0X1.19X90.0M (BLADE) ×2 IMPLANT
BNDG ESMARK 6X9 LF (GAUZE/BANDAGES/DRESSINGS) ×2
BOWL SMART MIX CTS (DISPOSABLE) ×2 IMPLANT
CAP KNEE ATTUNE RP ×2 IMPLANT
CEMENT HV SMART SET (Cement) ×4 IMPLANT
CUFF TOURN SGL QUICK 34 (TOURNIQUET CUFF) ×1
CUFF TRNQT CYL 34X4X40X1 (TOURNIQUET CUFF) ×1 IMPLANT
DECANTER SPIKE VIAL GLASS SM (MISCELLANEOUS) ×2 IMPLANT
DRAPE EXTREMITY T 121X128X90 (DRAPE) ×2 IMPLANT
DRAPE POUCH INSTRU U-SHP 10X18 (DRAPES) ×2 IMPLANT
DRAPE U-SHAPE 47X51 STRL (DRAPES) ×2 IMPLANT
DRSG ADAPTIC 3X8 NADH LF (GAUZE/BANDAGES/DRESSINGS) ×2 IMPLANT
DRSG PAD ABDOMINAL 8X10 ST (GAUZE/BANDAGES/DRESSINGS) ×2 IMPLANT
DURAPREP 26ML APPLICATOR (WOUND CARE) ×2 IMPLANT
ELECT REM PT RETURN 9FT ADLT (ELECTROSURGICAL) ×2
ELECTRODE REM PT RTRN 9FT ADLT (ELECTROSURGICAL) ×1 IMPLANT
EVACUATOR 1/8 PVC DRAIN (DRAIN) ×2 IMPLANT
FACESHIELD WRAPAROUND (MASK) ×10 IMPLANT
GAUZE SPONGE 4X4 12PLY STRL (GAUZE/BANDAGES/DRESSINGS) ×2 IMPLANT
GLOVE BIO SURGEON STRL SZ7.5 (GLOVE) ×2 IMPLANT
GLOVE BIO SURGEON STRL SZ8 (GLOVE) ×2 IMPLANT
GLOVE BIOGEL PI IND STRL 6.5 (GLOVE) ×1 IMPLANT
GLOVE BIOGEL PI IND STRL 8 (GLOVE) ×1 IMPLANT
GLOVE BIOGEL PI INDICATOR 6.5 (GLOVE) ×1
GLOVE BIOGEL PI INDICATOR 8 (GLOVE) ×1
GLOVE SURG SS PI 6.5 STRL IVOR (GLOVE) ×2 IMPLANT
GOWN STRL REUS W/TWL LRG LVL3 (GOWN DISPOSABLE) ×4 IMPLANT
GOWN STRL REUS W/TWL XL LVL3 (GOWN DISPOSABLE) ×4 IMPLANT
HANDPIECE INTERPULSE COAX TIP (DISPOSABLE) ×1
IMMOBILIZER KNEE 20 (SOFTGOODS) ×2
IMMOBILIZER KNEE 20 THIGH 36 (SOFTGOODS) ×1 IMPLANT
KIT BASIN OR (CUSTOM PROCEDURE TRAY) ×2 IMPLANT
MANIFOLD NEPTUNE II (INSTRUMENTS) ×2 IMPLANT
NDL SAFETY ECLIPSE 18X1.5 (NEEDLE) ×2 IMPLANT
NEEDLE HYPO 18GX1.5 SHARP (NEEDLE) ×2
NS IRRIG 1000ML POUR BTL (IV SOLUTION) ×2 IMPLANT
PACK TOTAL JOINT (CUSTOM PROCEDURE TRAY) ×2 IMPLANT
PADDING CAST COTTON 6X4 STRL (CAST SUPPLIES) ×6 IMPLANT
POSITIONER SURGICAL ARM (MISCELLANEOUS) ×2 IMPLANT
SET HNDPC FAN SPRY TIP SCT (DISPOSABLE) ×1 IMPLANT
SPONGE GAUZE 4X4 12PLY (GAUZE/BANDAGES/DRESSINGS) ×2 IMPLANT
STRIP CLOSURE SKIN 1/2X4 (GAUZE/BANDAGES/DRESSINGS) ×4 IMPLANT
SUCTION FRAZIER 12FR DISP (SUCTIONS) ×2 IMPLANT
SUT MNCRL AB 4-0 PS2 18 (SUTURE) ×2 IMPLANT
SUT VIC AB 2-0 CT1 27 (SUTURE) ×3
SUT VIC AB 2-0 CT1 TAPERPNT 27 (SUTURE) ×3 IMPLANT
SUT VLOC 180 0 24IN GS25 (SUTURE) ×2 IMPLANT
SYRINGE 20CC LL (MISCELLANEOUS) ×2 IMPLANT
SYRINGE 60CC LL (MISCELLANEOUS) ×2 IMPLANT
TOWEL OR 17X26 10 PK STRL BLUE (TOWEL DISPOSABLE) ×2 IMPLANT
TOWEL OR NON WOVEN STRL DISP B (DISPOSABLE) ×2 IMPLANT
TRAY FOLEY CATH 16FRSI W/METER (SET/KITS/TRAYS/PACK) ×2 IMPLANT
WATER STERILE IRR 1500ML POUR (IV SOLUTION) ×2 IMPLANT
WRAP KNEE MAXI GEL POST OP (GAUZE/BANDAGES/DRESSINGS) ×2 IMPLANT

## 2013-07-10 NOTE — H&P (View-Only) (Signed)
Dale [name] DOB: [birth year] Divorced / Language: [name] / Race: White Male  Date of Admission:  07-10-2013  Chief Complaint:  Right Knee Pain  History of Present Illness The patient is a 53 year old male who comes in for a preoperative History and Physical. The patient is scheduled for a right total knee arthroplasty to be performed by Dr. Dione Plover. Aluisio, MD at [facility] on 07-10-2013. The patient reports right knee symptoms including: pain which began 30 year(s) ago following a specific injury (Patient states that his right knee started to hurt on the medial side about 22yr ago. He said that his knee pain depends on how much he is on it. He is currently taking ibuprofen and that seems to help. He has had cortisone injections in the past and that also helps. He has had multiple surgeries on this knee in the past for different injuries and his last surgery on his knee was in December for an arthroscope.).The patient feels that the symptoms are worsening. He states that his right knee is now hurting at all times. He recently had an arthroscopy by Dr. Lorin Mercy in December 2014 and did not get better. He says that the pain is slightly better. He is not getting real sharp pain but it is still limiting what he can and cannot do. He was told by Dr. Lorin Mercy that he is a candidate for a unicompartmental replacement.  He states that he is tired of the way the knee is hurting him and the way it is limiting what he can and cannot do. He has had injections in the past, including cortisone and has not had viscosupplements recently. It's getting harder to stand at work because of his knee. He is not having any hip pain, lower extremity weakness or paraesthesia at this time. He is ready to get the knee fixed. They have been treated conservatively in the past for the above stated problem and despite conservative measures, they continue to have progressive pain and severe functional limitations and  dysfunction. They have failed non-operative management including home exercise, medications, and injections. It is felt that they would benefit from undergoing total joint replacement. Risks and benefits of the procedure have been discussed with the patient and they elect to proceed with surgery. There are no active contraindications to surgery such as ongoing infection or rapidly progressive neurological disease.   Allergies No Known Drug Allergies   Problem List/Past Medical Primary osteoarthritis of one knee (715.16  M17.10) Kidney Stone Hypercholesterolemia Anxiety Disorder Gastroesophageal Reflux Disease Depression    Family History Cerebrovascular Accident. grandmother fathers side Cancer. grandmother mothers side Depression. father Congestive Heart Failure. father Osteoarthritis. mother Heart Disease. father Diabetes Mellitus. grandmother fathers side Hypertension. mother and grandmother mothers side Heart disease in male family member before age 55    Social History Drug/Alcohol Rehab (Currently). no Current work status. working full time Exercise. Exercises weekly; does running / walking and gym / weights Drug/Alcohol Rehab (Previously). no Children. 1 Alcohol use. current drinker; drinks beer, wine and hard liquor; only occasionally per week Illicit drug use. no Tobacco use. never smoker Tobacco / smoke exposure. no Marital status. divorced Living situation. live alone Pain Contract. no Number of flights of stairs before winded. greater than 5    Medication History Glucosamine Chondr 1500 Complx ( Oral) Specific dose unknown - Active. Atorvastatin Calcium (10MG  Tablet, Oral) Active. LORazepam (0.5MG  Tablet, Oral) Active. Claritin ( Oral) Specific dose unknown - Active. Viagra (100MG  Tablet, Oral)  Active. Multiple Vitamin ( Oral) Active.   Past Surgical History Spinal Fusion. lower back Rotator Cuff Repair.  left Vasectomy Spinal Surgery Right Knee Surgery (6 times) Left Knee Surgery   Review of Systems(Alexzandrew L Perkins, III PA-C; 07/06/2013 3:46 PM) General:Not Present- Chills, Fever, Night Sweats, Fatigue, Weight Gain, Weight Loss and Memory Loss. Skin:Not Present- Hives, Itching, Rash, Eczema and Lesions. HEENT:Not Present- Tinnitus, Headache, Double Vision, Visual Loss, Hearing Loss and Dentures. Respiratory:Present- Cough (recent cold but treated with ABX) and Sputum Production (with recent cold but improved). Not Present- Shortness of breath with exertion, Shortness of breath at rest, Allergies, Coughing up blood and Chronic Cough. Cardiovascular:Not Present- Chest Pain, Racing/skipping heartbeats, Difficulty Breathing Lying Down, Murmur, Swelling and Palpitations. Gastrointestinal:Not Present- Bloody Stool, Heartburn, Abdominal Pain, Vomiting, Nausea, Constipation, Diarrhea, Difficulty Swallowing, Jaundice and Loss of appetitie. Male Genitourinary:Not Present- Urinary frequency, Blood in Urine, Weak urinary stream, Discharge, Flank Pain, Incontinence, Painful Urination, Urgency, Urinary Retention and Urinating at Night. Musculoskeletal:Not Present- Muscle Weakness, Muscle Pain, Joint Swelling, Joint Pain, Back Pain, Morning Stiffness and Spasms. Neurological:Not Present- Tremor, Dizziness, Blackout spells, Paralysis, Difficulty with balance and Weakness. Psychiatric:Not Present- Insomnia.    Vitals Weight: 180 lb Height: 71 in Body Surface Area: 2.02 m Body Mass Index: 25.1 kg/m BP: 132/92 (Sitting, Left Arm, Standard)     Physical Exam The physical exam findings are as follows:   General Mental Status - Alert, cooperative and good historian. General Appearance- pleasant. Not in acute distress. Orientation- Oriented X3. Build & Nutrition- Well nourished and Well developed.   Head and Neck Head- normocephalic, atraumatic . Neck Global  Assessment- supple. no bruit auscultated on the right and no bruit auscultated on the left.   Eye Pupil- Bilateral- Regular and Round. Motion- Bilateral- EOMI.   Chest and Lung Exam Auscultation: Breath sounds:- clear at anterior chest wall and - clear at posterior chest wall. Adventitious sounds:- No Adventitious sounds.   Cardiovascular Auscultation:Rhythm- Regular rate and rhythm. Heart Sounds- S1 WNL and S2 WNL. Murmurs & Other Heart Sounds:Auscultation of the heart reveals - No Murmurs.   Abdomen Palpation/Percussion:Tenderness- Abdomen is non-tender to palpation. Rigidity (guarding)- Abdomen is soft. Auscultation:Auscultation of the abdomen reveals - Bowel sounds normal.   Male Genitourinary Not done, not pertinent to present illness  Musculoskeletal Musculoskeletal: Evaluation of his hips show normal range of motion, no discomfort. His left knee exam is normal. His right knee shows no effusion. He has got slight varus. His range is about 5 to 125. There is moderate crepitus on range of motion. He is tender medially, with no lateral tenderness or instability noted. Pulse, sensation, and motor are intact.  X-RAYS: AP both knees and lateral show near bone on bone arthritis in the medial compartment of the right knee with significant patellofemoral narrowing also.   Assessment & Plan Primary osteoarthritis of one knee (715.16  M17.10) Impression: Right Knee  Note: Plan is for a Right Total Knee Replacement by Dr. Wynelle Link.  Plan is to go home.  PCP - Dr. Sarajane Jews - Patient has been seen preoperatively and felt to be stable for surgery.  The patient does not have any contraindications and will receive TXA (tranexamic acid) prior to surgery.  Signed electronically by Joelene Millin, III PA-C

## 2013-07-10 NOTE — Anesthesia Preprocedure Evaluation (Signed)
Anesthesia Evaluation  Patient identified by MRN, date of birth, ID band Patient awake    Reviewed: Allergy & Precautions, H&P , NPO status , Patient's Chart, lab work & pertinent test results  Airway Mallampati: III TM Distance: >3 FB Neck ROM: Full    Dental no notable dental hx. (+) Teeth Intact   Pulmonary neg pulmonary ROS,  breath sounds clear to auscultation  Pulmonary exam normal       Cardiovascular negative cardio ROS  Rhythm:Regular Rate:Normal     Neuro/Psych Anxiety Depression negative neurological ROS  negative psych ROS   GI/Hepatic negative GI ROS, Neg liver ROS,   Endo/Other  negative endocrine ROS  Renal/GU negative Renal ROS  negative genitourinary   Musculoskeletal negative musculoskeletal ROS (+)   Abdominal   Peds negative pediatric ROS (+)  Hematology negative hematology ROS (+)   Anesthesia Other Findings   Reproductive/Obstetrics negative OB ROS                           Anesthesia Physical Anesthesia Plan  ASA: II  Anesthesia Plan: Spinal   Post-op Pain Management:    Induction:   Airway Management Planned: Simple Face Mask  Additional Equipment:   Intra-op Plan:   Post-operative Plan:   Informed Consent: I have reviewed the patients History and Physical, chart, labs and discussed the procedure including the risks, benefits and alternatives for the proposed anesthesia with the patient or authorized representative who has indicated his/her understanding and acceptance.   Dental advisory given  Plan Discussed with: CRNA  Anesthesia Plan Comments:         Anesthesia Quick Evaluation

## 2013-07-10 NOTE — Anesthesia Postprocedure Evaluation (Signed)
  Anesthesia Post-op Note  Patient: Dale [name]  Procedure(s) Performed: Procedure(s) (LRB): RIGHT TOTAL KNEE ARTHROPLASTY (Right)  Patient Location: PACU  Anesthesia Type: Spinal  Level of Consciousness: awake and alert   Airway and Oxygen Therapy: Patient Spontanous Breathing  Post-op Pain: mild  Post-op Assessment: Post-op Vital signs reviewed, Patient's Cardiovascular Status Stable, Respiratory Function Stable, Patent Airway and No signs of Nausea or vomiting  Last Vitals:  Filed Vitals:   07/10/13 1641  BP: 129/69  Pulse:   Temp: 36.8 C  Resp: 16    Post-op Vital Signs: stable   Complications: No apparent anesthesia complications

## 2013-07-10 NOTE — Interval H&P Note (Signed)
History and Physical Interval Note:  07/10/2013 8:39 AM  Dale [name]  has presented today for surgery, with the diagnosis of OA RIGHT KNEE  The various methods of treatment have been discussed with the patient and family. After consideration of risks, benefits and other options for treatment, the patient has consented to  Procedure(s): RIGHT TOTAL KNEE ARTHROPLASTY (Right) as a surgical intervention .  The patient's history has been reviewed, patient examined, no change in status, stable for surgery.  I have reviewed the patient's chart and labs.  Questions were answered to the patient's satisfaction.     Gearlean Alf

## 2013-07-10 NOTE — Transfer of Care (Signed)
Immediate Anesthesia Transfer of Care Note  Patient: Dale [name]  Procedure(s) Performed: Procedure(s) (LRB): RIGHT TOTAL KNEE ARTHROPLASTY (Right)  Patient Location: PACU  Anesthesia Type: Spinal  Level of Consciousness: sedated, patient cooperative and responds to stimulation  Airway & Oxygen Therapy: Patient Spontanous Breathing and Patient connected to face mask oxgen  Post-op Assessment: Report given to PACU RN and Post -op Vital signs reviewed and stable  Post vital signs: Reviewed and stable  Complications: No apparent anesthesia complications. L3 level on exam denied pain on assessment.

## 2013-07-10 NOTE — Op Note (Signed)
Pre-operative diagnosis- Osteoarthritis  Right knee(s)  Post-operative diagnosis- Osteoarthritis Right knee(s)  Procedure-  Right  Total Knee Arthroplasty  Surgeon- Dale Plover. Konner Saiz, MD  Assistant- Arlee Muslim, PA-C   Anesthesia-  Spinal  EBL-* No blood loss amount entered *   Drains Hemovac  Tourniquet time- 37 minutes @ 419 mm Hg    Complications- None  Condition-PACU - hemodynamically stable.   Brief Clinical Note  Dale [name] is a 53 y.o. year old male with end stage OA of his right knee with progressively worsening pain and dysfunction. He has constant pain, with activity and at rest and significant functional deficits with difficulties even with ADLs. He has had extensive non-op management including analgesics, injections of cortisone, and home exercise program, but remains in significant pain with significant dysfunction. He presents now for right Total Knee Arthroplasty.    Procedure in detail---   The patient is brought into the operating room and positioned supine on the operating table. After successful administration of  Spinal,   a tourniquet is placed high on the  Right thigh(s) and the lower extremity is prepped and draped in the usual sterile fashion. Time out is performed by the operating team and then the  Right lower extremity is wrapped in Esmarch, knee flexed and the tourniquet inflated to 300 mmHg.       A midline incision is made with a ten blade through the subcutaneous tissue to the level of the extensor mechanism. A fresh blade is used to make a medial parapatellar arthrotomy. Soft tissue over the proximal medial tibia is subperiosteally elevated to the joint line with a knife and into the semimembranosus bursa with a Cobb elevator. Soft tissue over the proximal lateral tibia is elevated with attention being paid to avoiding the patellar tendon on the tibial tubercle. The patella is everted, knee flexed 90 degrees and the ACL and PCL are removed. Findings are  exposed bone medial and patellofemoral compartments with large medial osteophytes.        The drill is used to create a starting hole in the distal femur and the canal is thoroughly irrigated with sterile saline to remove the fatty contents. The 5 degree Right  valgus alignment guide is placed into the femoral canal and the distal femoral cutting block is pinned to remove 10 mm off the distal femur. Resection is made with an oscillating saw.      The tibia is subluxed forward and the menisci are removed. The extramedullary alignment guide is placed referencing proximally at the medial aspect of the tibial tubercle and distally along the second metatarsal axis and tibial crest. The block is pinned to remove 39mm off the more deficient medial  side. Resection is made with an oscillating saw. Size 8is the most appropriate size for the tibia and the proximal tibia is prepared with the modular drill and keel punch for that size.      The femoral sizing guide is placed and size 7 is most appropriate. Rotation is marked off the epicondylar axis and confirmed by creating a rectangular flexion gap at 90 degrees. The size 7 cutting block is pinned in this rotation and the anterior, posterior and chamfer cuts are made with the oscillating saw. The intercondylar block is then placed and that cut is made.      Trial size 8 tibial component, trial size 7 posterior stabilized femur and a 8  mm posterior stabilized rotating platform insert trial is placed. Full extension is  achieved with excellent varus/valgus and anterior/posterior balance throughout full range of motion. The patella is everted and thickness measured to be 27  mm. Free hand resection is taken to 15 mm, a 41 template is placed, lug holes are drilled, trial patella is placed, and it tracks normally. Osteophytes are removed off the posterior femur with the trial in place. All trials are removed and the cut bone surfaces prepared with pulsatile lavage. Cement is  mixed and once ready for implantation, the size 8 tibial implant, size  7 posterior stabilized femoral component, and the size 41 patella are cemented in place and the patella is held with the clamp. The trial insert is placed and the knee held in full extension. The Exparel (20 ml mixed with 30 ml saline) and .25% Bupivicaine, are injected into the extensor mechanism, posterior capsule, medial and lateral gutters and subcutaneous tissues.  All extruded cement is removed and once the cement is hard the permanent 8 mm posterior stabilized rotating platform insert is placed into the tibial tray.      The wound is copiously irrigated with saline solution and the extensor mechanism closed over a hemovac drain with #1 V-loc suture. The tourniquet is released for a total tourniquet time of 37  minutes. Flexion against gravity is 140 degrees and the patella tracks normally. Subcutaneous tissue is closed with 2.0 vicryl and subcuticular with running 4.0 Monocryl. The incision is cleaned and dried and steri-strips and a bulky sterile dressing are applied. The limb is placed into a knee immobilizer and the patient is awakened and transported to recovery in stable condition.      Please note that a surgical assistant was a medical necessity for this procedure in order to perform it in a safe and expeditious manner. Surgical assistant was necessary to retract the ligaments and vital neurovascular structures to prevent injury to them and also necessary for proper positioning of the limb to allow for anatomic placement of the prosthesis.   Dale Plover Hector Venne, MD    07/10/2013, 11:49 AM

## 2013-07-10 NOTE — Anesthesia Procedure Notes (Addendum)
Spinal  Patient location during procedure: OR Start time: 07/10/2013 10:40 AM End time: 07/10/2013 10:44 AM Staffing CRNA/Resident: Anne Fu Performed by: resident/CRNA  Preanesthetic Checklist Completed: patient identified, site marked, surgical consent, pre-op evaluation, timeout performed, IV checked, risks and benefits discussed and monitors and equipment checked Spinal Block Patient position: sitting Prep: Betadine Patient monitoring: heart rate, continuous pulse ox and blood pressure Approach: right paramedian Location: L2-3 Injection technique: single-shot Needle Needle type: Whitacre  Needle gauge: 25 G Needle length: 9 cm Assessment Sensory level: T4 Additional Notes Expiration date of kit checked and confirmed. Patient tolerated procedure well, without complications. X 1 attempt with noted easy aspiration and administration of medication.

## 2013-07-11 ENCOUNTER — Encounter (HOSPITAL_COMMUNITY): Payer: Self-pay | Admitting: *Deleted

## 2013-07-11 LAB — CBC
HCT: 34.7 % — ABNORMAL LOW (ref 39.0–52.0)
Hemoglobin: 11.7 g/dL — ABNORMAL LOW (ref 13.0–17.0)
MCH: 30.8 pg (ref 26.0–34.0)
MCHC: 33.7 g/dL (ref 30.0–36.0)
MCV: 91.3 fL (ref 78.0–100.0)
Platelets: 166 10*3/uL (ref 150–400)
RBC: 3.8 MIL/uL — ABNORMAL LOW (ref 4.22–5.81)
RDW: 13 % (ref 11.5–15.5)
WBC: 17.1 10*3/uL — ABNORMAL HIGH (ref 4.0–10.5)

## 2013-07-11 LAB — BASIC METABOLIC PANEL
BUN: 14 mg/dL (ref 6–23)
CO2: 25 mEq/L (ref 19–32)
Calcium: 8.2 mg/dL — ABNORMAL LOW (ref 8.4–10.5)
Chloride: 103 mEq/L (ref 96–112)
Creatinine, Ser: 0.85 mg/dL (ref 0.50–1.35)
GFR calc Af Amer: >90 mL/min (ref >90)
GFR calc non Af Amer: >90 mL/min (ref >90)
Glucose, Bld: 167 mg/dL — ABNORMAL HIGH (ref 70–99)
Potassium: 4.2 mEq/L (ref 3.7–5.3)
Sodium: 139 mEq/L (ref 137–147)

## 2013-07-11 MED ORDER — OMEPRAZOLE 20 MG PO CPDR
20.0000 mg | DELAYED_RELEASE_CAPSULE | Freq: Every day | ORAL | Status: DC
  Administered 2013-07-11 – 2013-07-12 (×2): 20 mg via ORAL
  Filled 2013-07-11 (×2): qty 1

## 2013-07-11 MED ORDER — NON FORMULARY: 20.0000 mg | Freq: Every day | Status: DC

## 2013-07-11 MED ORDER — OXYCODONE HCL 5 MG PO TABS
5.0000 mg | ORAL_TABLET | ORAL | Status: DC | PRN
  Administered 2013-07-11 – 2013-07-12 (×9): 15 mg via ORAL
  Filled 2013-07-11 (×9): qty 3
  Filled 2013-07-11: qty 1

## 2013-07-11 NOTE — Evaluation (Signed)
Physical Therapy Evaluation Patient Details Name: Dale [name] MRN: [id number] DOB: [birth year] Today's Date: 07/11/2013   History of Present Illness  RTKA  Clinical Impression  Pt tolerated ambulation well. Pt will benefit frpm PT to address problems listed in note below to return to modified indpendence.    Follow Up Recommendations Home health PT    Equipment Recommendations  None recommended by PT    Recommendations for Other Services       Precautions / Restrictions Precautions Precautions: Knee Required Braces or Orthoses: Knee Immobilizer - Right      Mobility  Bed Mobility Overal bed mobility: Needs Assistance Bed Mobility: Supine to Sit     Supine to sit: Supervision     General bed mobility comments: cues for safety  Transfers Overall transfer level: Needs assistance Equipment used: Rolling walker (2 wheeled) Transfers: Sit to/from Stand Sit to Stand: Min guard         General transfer comment: cues for UE position and R leg position  Ambulation/Gait Ambulation/Gait assistance: Min guard Ambulation Distance (Feet): 200 Feet Assistive device: Rolling walker (2 wheeled) Gait Pattern/deviations: Step-to pattern;Step-through pattern;Antalgic     General Gait Details: pt reports R leg feels different , cues for sequence,a d posture  Stairs            Wheelchair Mobility    Modified Rankin (Stroke Patients Only)       Balance                                             Pertinent Vitals/Pain R knee 5 to 3 after mobility    Home Living Family/patient expects to be discharged to:: Private residence Living Arrangements: Alone Available Help at Discharge: Family Type of Home: House Home Access: Stairs to enter Entrance Stairs-Rails: Psychiatric nurse of Steps: 7 Home Layout: Two level;Able to live on main level with bedroom/bathroom Home Equipment: Shower seat - built in;Walker - 2  wheels;Crutches      Prior Function Level of Independence: Independent               Hand Dominance   Dominant Hand: Right    Extremity/Trunk Assessment   Upper Extremity Assessment: Defer to OT evaluation           Lower Extremity Assessment: RLE deficits/detail RLE Deficits / Details: requires assist for SLR    Cervical / Trunk Assessment: Normal  Communication   Communication: No difficulties  Cognition Arousal/Alertness: Awake/alert Behavior During Therapy: WFL for tasks assessed/performed Overall Cognitive Status: Within Functional Limits for tasks assessed                      General Comments      Exercises Total Joint Exercises Quad Sets: AROM;Right;10 reps Short Arc QuadSinclair Ship;Right;10 reps Heel Slides: AAROM;Right;10 reps Hip ABduction/ADduction: AAROM;Right;10 reps Straight Leg Raises: AAROM;Right;10 reps Goniometric ROM: 15-45      Assessment/Plan    PT Assessment Patient needs continued PT services  PT Diagnosis Difficulty walking;Acute pain   PT Problem List Decreased strength;Decreased range of motion;Decreased activity tolerance;Decreased mobility;Pain;Decreased safety awareness;Decreased knowledge of precautions;Decreased knowledge of use of DME  PT Treatment Interventions DME instruction;Gait training;Stair training;Functional mobility training;Therapeutic activities;Therapeutic exercise;Patient/family education   PT Goals (Current goals can be found in the Care Plan section) Acute Rehab PT Goals Patient Stated Goal: I want  to have a quality of life PT Goal Formulation: With patient Time For Goal Achievement: 07/13/13 Potential to Achieve Goals: Good    Frequency 7X/week   Barriers to discharge        Co-evaluation               End of Session Equipment Utilized During Treatment: Right knee immobilizer Activity Tolerance: Patient tolerated treatment well Patient left: in chair;with call bell/phone within  reach Nurse Communication: Mobility status         Time: 1035-1105 PT Time Calculation (min): 30 min   Charges:   PT Evaluation $Initial PT Evaluation Tier I: 1 Procedure PT Treatments $Gait Training: 8-22 mins $Therapeutic Exercise: 8-22 mins   PT G Codes:          Claretha Cooper 07/11/2013, 12:34 PM Tresa Endo PT [phone]

## 2013-07-11 NOTE — Evaluation (Signed)
Occupational Therapy Evaluation Patient Details Name: Dale [name] MRN: [id number] DOB: [birth year] Today's Date: 07/11/2013    History of Present Illness RTKA   Clinical Impression   Pt demonstrates decline in function with ADLs and ADL mobility safety. Pt would benefit from acute OT services to address impairments to increase level of function and safety.    Follow Up Recommendations  No OT follow up;Supervision - Intermittent    Equipment Recommendations  None recommended by OT    Recommendations for Other Services       Precautions / Restrictions Precautions Precautions: Knee Required Braces or Orthoses: Knee Immobilizer - Right Restrictions Weight Bearing Restrictions: No Other Position/Activity Restrictions: WBAT      Mobility Bed Mobility Overal bed mobility: Needs Assistance Bed Mobility: Supine to Sit     Supine to sit: Supervision     General bed mobility comments: pt up in recliner upon entering room  Transfers Overall transfer level: Needs assistance Equipment used: Rolling walker (2 wheeled) Transfers: Sit to/from Stand Sit to Stand: Min guard         General transfer comment: cues for UE position and R leg position    Balance Overall balance assessment: Needs assistance Sitting-balance support: No upper extremity supported;Feet supported Sitting balance-Leahy Scale: Good     Standing balance support: Bilateral upper extremity supported;Single extremity supported;During functional activity Standing balance-Leahy Scale: Fair                              ADL Overall ADL's : Needs assistance/impaired     Grooming: Wash/dry hands;Wash/dry face;Min guard;Standing   Upper Body Bathing: Set up;Sitting;Moderate assistance       Upper Body Dressing : Set up;Sitting   Lower Body Dressing: Moderate assistance   Toilet Transfer: Min guard;RW;Comfort height toilet   Toileting- Clothing Manipulation and Hygiene: Min guard;Sit  to/from stand   Tub/ Shower Transfer: Min guard;Grab bars;3 in 1   Functional mobility during ADLs: Min guard General ADL Comments: Reviewed ADL A/E with pt. Per pt report he is familar with A/E form previous back surgery     Vision  wears glasses                   Perception Perception Perception Tested?: No   Praxis Praxis Praxis tested?: Not tested    Pertinent Vitals/Pain 5/10 knee pain reported, VSS     Hand Dominance Right   Extremity/Trunk Assessment Upper Extremity Assessment Upper Extremity Assessment: Overall WFL for tasks assessed   Lower Extremity Assessment Lower Extremity Assessment: Defer to PT evaluation RLE Deficits / Details: requires assist for SLR   Cervical / Trunk Assessment Cervical / Trunk Assessment: Normal   Communication Communication Communication: No difficulties   Cognition Arousal/Alertness: Awake/alert Behavior During Therapy: WFL for tasks assessed/performed Overall Cognitive Status: Within Functional Limits for tasks assessed                     General Comments   Pt pleasant and cooperative                 Home Living Family/patient expects to be discharged to:: Private residence Living Arrangements: Alone Available Help at Discharge: Family Type of Home: House Home Access: Stairs to enter Technical brewer of Steps: 7 Entrance Stairs-Rails: Right;Left Home Layout: Two level;Able to live on main level with bedroom/bathroom Alternate Level Stairs-Number of Steps: 13   Bathroom Shower/Tub: Walk-in shower;Tub/shower unit  Bathroom Toilet: Standard     Home Equipment: Shower seat - built in;Walker - 2 wheels;Crutches          Prior Functioning/Environment Level of Independence: Independent             OT Diagnosis: Acute pain   OT Problem List: Pain;Impaired balance (sitting and/or standing);Decreased activity tolerance   OT Treatment/Interventions: Self-care/ADL training;Balance  training;Therapeutic activities;Neuromuscular education;DME and/or AE instruction;Patient/family education    OT Goals(Current goals can be found in the care plan section) Acute Rehab OT Goals Patient Stated Goal: I want to have a quality of life OT Goal Formulation: With patient Time For Goal Achievement: 07/18/13 Potential to Achieve Goals: Good ADL Goals Pt Will Perform Grooming: with set-up;with supervision;standing Pt Will Perform Lower Body Bathing: with min assist;with adaptive equipment Pt Will Perform Lower Body Dressing: with min assist;with adaptive equipment Pt Will Transfer to Toilet: with supervision;with modified independence Pt Will Perform Toileting - Clothing Manipulation and hygiene: with supervision;with modified independence Pt Will Perform Tub/Shower Transfer: with supervision;with modified independence;shower seat  OT Frequency: Min 2X/week   Barriers to D/C:  none  pt will have his girlfrined at home with him for assist per his report                     End of Session Equipment Utilized During Treatment: Rolling walker;Other (comment) (3 in 1 over toilet) CPM Right Knee CPM Right Knee: Off  Activity Tolerance: Patient limited by pain Patient left: in chair;with call bell/phone within reach   Time: 1126-1141 OT Time Calculation (min): 15 min Charges:  OT General Charges $OT Visit: 1 Procedure OT Evaluation $Initial OT Evaluation Tier I: 1 Procedure G-Codes:    Britt Bottom 07/11/2013, 2:18 PM

## 2013-07-11 NOTE — Progress Notes (Addendum)
   Subjective: 1 Day Post-Op Procedure(s) (LRB): RIGHT TOTAL KNEE ARTHROPLASTY (Right) Patient reports pain as moderate.   Patient seen in rounds with Dr. Wynelle Link. Patient is well, but has had some minor complaints of pain in the knee, requiring pain medications We will start therapy today.  Plan is to go Home after hospital stay.  Objective: Vital signs in last 24 hours: Temp:  [97 F (36.1 C)-98.3 F (36.8 C)] 97.8 F (36.6 C) (06/23 0650) Pulse Rate:  [64-103] 90 (06/23 0650) Resp:  [16-18] 16 (06/23 0650) BP: (112-138)/(69-85) 128/75 mmHg (06/23 0650) SpO2:  [96 %-100 %] 96 % (06/23 0650) Weight:  [80.967 kg (178 lb 8 oz)] 80.967 kg (178 lb 8 oz) (06/22 1342)  Intake/Output from previous day:  Intake/Output Summary (Last 24 hours) at 07/11/13 0739 Last data filed at 07/11/13 0650  Gross per 24 hour  Intake 4718.33 ml  Output   2655 ml  Net 2063.33 ml    Intake/Output this shift:    Labs:  Recent Labs  07/11/13 0426  HGB 11.7*    Recent Labs  07/11/13 0426  WBC 17.1*  RBC 3.80*  HCT 34.7*  PLT 166    Recent Labs  07/11/13 0426  NA 139  K 4.2  CL 103  CO2 25  BUN 14  CREATININE 0.85  GLUCOSE 167*  CALCIUM 8.2*   No results found for this basename: LABPT, INR,  in the last 72 hours  EXAM General - Patient is Alert, Appropriate and Oriented Extremity - Neurovascular intact Sensation intact distally Dorsiflexion/Plantar flexion intact Dressing - dressing C/D/I Motor Function - intact, moving foot and toes well on exam.  Hemovac pulled without difficulty.  Past Medical History  Diagnosis Date  . Allergy   . Depression   . Hyperlipidemia   . History of nephrolithiasis   . Chest pain, non-cardiac     normal stress test on 08-26-09   . GERD (gastroesophageal reflux disease)     TAKES MED  . Arthritis     "SOME IN BACK"  . Anxiety     Assessment/Plan: 1 Day Post-Op Procedure(s) (LRB): RIGHT TOTAL KNEE ARTHROPLASTY (Right) Principal  Problem:   OA (osteoarthritis) of knee Active Problems:   Postoperative anemia due to acute blood loss  Estimated body mass index is 24.91 kg/(m^2) as calculated from the following:   Height as of this encounter: 5\' 11"  (1.803 m).   Weight as of this encounter: 80.967 kg (178 lb 8 oz). Advance diet Up with therapy Plan for discharge tomorrow Discharge home with home health  DVT Prophylaxis - Xarelto Weight-Bearing as tolerated to right leg D/C O2 and Pulse OX and try on Room Air Foley already out. Will increase pain medications.  Arlee Muslim, PA-C Orthopaedic Surgery 07/11/2013, 7:39 AM  Addendum - please note that the patient is currently taking Levaquin for a sinusitis that he had prior to admission.  The patient will continue on the Levaquin will was prescribed by his PCP prior to surgery. Please let this note serve as the notification in order to adhere to [facility] protocols.

## 2013-07-11 NOTE — Discharge Instructions (Addendum)
Dr. Gaynelle Arabian Total Joint Specialist [facility] [address] [place], [place] [address] [phone]  TOTAL KNEE REPLACEMENT POSTOPERATIVE DIRECTIONS    Knee Rehabilitation, Guidelines Following Surgery  Results after knee surgery are often greatly improved when you follow the exercise, range of motion and muscle strengthening exercises prescribed by your doctor. Safety measures are also important to protect the knee from further injury. Any time any of these exercises cause you to have increased pain or swelling in your knee joint, decrease the amount until you are comfortable again and slowly increase them. If you have problems or questions, call your caregiver or physical therapist for advice.   HOME CARE INSTRUCTIONS  Remove items at home which could result in a fall. This includes throw rugs or furniture in walking pathways.  Continue medications as instructed at time of discharge. You may have some home medications which will be placed on hold until you complete the course of blood thinner medication.  You may start showering once you are discharged home but do not submerge the incision under water. Just pat the incision dry and apply a dry gauze dressing on daily. Walk with walker as instructed.  You may resume a sexual relationship in one month or when given the OK by  your doctor.   Use walker as long as suggested by your caregivers.  Avoid periods of inactivity such as sitting longer than an hour when not asleep. This helps prevent blood clots.  You may put full weight on your legs and walk as much as is comfortable.  You may return to work once you are cleared by your doctor.  Do not drive a car for 6 weeks or until released by you surgeon.   Do not drive while taking narcotics.  Wear the elastic stockings for three weeks following surgery during the day but you may remove then at night. Make sure you keep all of your appointments  after your operation with all of your doctors and caregivers. You should call the office at the above phone number and make an appointment for approximately two weeks after the date of your surgery. Change the dressing daily and reapply a dry dressing each time. Please pick up a stool softener and laxative for home use as long as you are requiring pain medications.  Continue to use ice on the knee for pain and swelling from surgery. You may notice swelling that will progress down to the foot and ankle.  This is normal after surgery.  Elevate the leg when you are not up walking on it.   It is important for you to complete the blood thinner medication as prescribed by your doctor.  Continue to use the breathing machine which will help keep your temperature down.  It is common for your temperature to cycle up and down following surgery, especially at night when you are not up moving around and exerting yourself.  The breathing machine keeps your lungs expanded and your temperature down.  RANGE OF MOTION AND STRENGTHENING EXERCISES  Rehabilitation of the knee is important following a knee injury or an operation. After just a few days of immobilization, the muscles of the thigh which control the knee become weakened and shrink (atrophy). Knee exercises are designed to build up the tone and strength of the thigh muscles and to improve knee motion. Often times heat used for twenty to thirty minutes before working out will loosen up your tissues and help  with improving the range of motion but do not use heat for the first two weeks following surgery. These exercises can be done on a training (exercise) mat, on the floor, on a table or on a bed. Use what ever works the best and is most comfortable for you Knee exercises include:  Leg Lifts - While your knee is still immobilized in a splint or cast, you can do straight leg raises. Lift the leg to 60 degrees, hold for 3 sec, and slowly lower the leg. Repeat 10-20  times 2-3 times daily. Perform this exercise against resistance later as your knee gets better.  Quad and Hamstring Sets - Tighten up the muscle on the front of the thigh (Quad) and hold for 5-10 sec. Repeat this 10-20 times hourly. Hamstring sets are done by pushing the foot backward against an object and holding for 5-10 sec. Repeat as with quad sets.  A rehabilitation program following serious knee injuries can speed recovery and prevent re-injury in the future due to weakened muscles. Contact your doctor or a physical therapist for more information on knee rehabilitation.   SKILLED REHAB INSTRUCTIONS: If the patient is transferred to a skilled rehab facility following release from the hospital, a list of the current medications will be sent to the facility for the patient to continue.  When discharged from the skilled rehab facility, please have the facility set up the patient's [place] prior to being released. Also, the skilled facility will be responsible for providing the patient with their medications at time of release from the facility to include their pain medication, the muscle relaxants, and their blood thinner medication. If the patient is still at the rehab facility at time of the two week follow up appointment, the skilled rehab facility will also need to assist the patient in arranging follow up appointment in our office and any transportation needs.  MAKE SURE YOU:  Understand these instructions.  Will watch your condition.  Will get help right away if you are not doing well or get worse.    Pick up stool softner and laxative for home. Do not submerge incision under water. May shower. Continue to use ice for pain and swelling from surgery.  Take Xarelto for two and a half more weeks, then discontinue Xarelto. Once the patient has completed the Xarelto, they may resume the 81 mg Aspirin.   Information on my medicine - XARELTO (Rivaroxaban)  This  medication education was reviewed with me or my healthcare representative as part of my discharge preparation.  The pharmacist that spoke with me during my hospital stay was:  Leeroy Bock, [facility]  Why was Xarelto prescribed for you? Xarelto was prescribed for you to reduce the risk of blood clots forming after orthopedic surgery. The medical term for these abnormal blood clots is venous thromboembolism (VTE).  What do you need to know about xarelto ? Take your Xarelto ONCE DAILY at the same time every day. You may take it either with or without food.  If you have difficulty swallowing the tablet whole, you may crush it and mix in applesauce just prior to taking your dose.  Take Xarelto exactly as prescribed by your doctor and DO NOT stop taking Xarelto without talking to the doctor who prescribed the medication.  Stopping without other VTE prevention medication to take the place of Xarelto may increase your risk of developing a clot.  After discharge, you should have regular check-up appointments with your healthcare  provider that is prescribing your Xarelto.    What do you do if you miss a dose? If you miss a dose, take it as soon as you remember on the same day then continue your regularly scheduled once daily regimen the next day. Do not take two doses of Xarelto on the same day.   Important Safety Information A possible side effect of Xarelto is bleeding. You should call your healthcare provider right away if you experience any of the following:   Bleeding from an injury or your nose that does not stop.   Unusual colored urine (red or dark brown) or unusual colored stools (red or black).   Unusual bruising for unknown reasons.   A serious fall or if you hit your head (even if there is no bleeding).  Some medicines may interact with Xarelto and might increase your risk of bleeding while on Xarelto. To help avoid this, consult your healthcare provider or pharmacist  prior to using any new prescription or non-prescription medications, including herbals, vitamins, non-steroidal anti-inflammatory drugs (NSAIDs) and supplements.  This website has more information on Xarelto: [email or web address]

## 2013-07-11 NOTE — Progress Notes (Signed)
Physical Therapy Treatment Patient Details Name: Dale [name] MRN: [id number] DOB: 07-18-1960 Today's Date: 07/11/2013    History of Present Illness RTKA    PT Comments    Pt has been having increased pain,. Encouraged  To prop R leg  So leg is in neutral and not ER and knee flexed.   Follow Up Recommendations  Home health PT     Equipment Recommendations  None recommended by PT    Recommendations for Other Services       Precautions / Restrictions Precautions Precautions: Knee Required Braces or Orthoses: Restrictions Weight Bearing Restrictions: No Other Position/Activity Restrictions: WBAT    Mobility  Bed Mobility Overal bed mobility: Modified Independent Bed Mobility: Supine to Sit     Supine to sit: Supervision     General bed mobility comments:   Transfers Overall transfer level: Needs assistance Equipment used: Rolling walker (2 wheeled) Transfers: Sit to/from Stand Sit to Stand: Supervision         General transfer comment: cues for UE position and R leg position  Ambulation/Gait Ambulation/Gait assistance: Min guard Ambulation Distance (Feet): 200 Feet Assistive device: Rolling walker (2 wheeled) Gait Pattern/deviations: Step-through pattern     General Gait Details: pt reports R leg feels different , cues for sequence,   Stairs            Wheelchair Mobility    Modified Rankin (Stroke Patients Only)       Balance Overall balance assessment: Needs assistance Sitting-balance support: No upper extremity supported;Feet supported Sitting balance-Leahy Scale: Good     Standing balance support: Bilateral upper extremity supported;Single extremity supported;During functional activity Standing balance-Leahy Scale: Fair                      Cognition Arousal/Alertness: Awake/alert Behavior During Therapy: WFL for tasks assessed/performed Overall Cognitive Status: Within Functional Limits for tasks assessed                       Exercises Total Joint Exercises Quad Sets: AROM;Right;10 reps;Supine Short Arc Quad: AAROM;Right;10 reps Heel Slides: AAROM;Right;10 reps Hip ABduction/ADduction: AAROM;Right;10 reps Straight Leg Raises: AAROM;Right;10 reps Goniometric ROM: 15-45    General Comments        Pertinent Vitals/Pain 7 R knee    Home Living Family/patient expects to be discharged to:: Private residence Living Arrangements: Alone Available Help at Discharge: Family Type of Home: House Home Access: Stairs to enter Entrance Stairs-Rails: Right;Left Home Layout: Two level;Able to live on main level with bedroom/bathroom Home Equipment: Shower seat - built in;Walker - 2 wheels;Crutches      Prior Function Level of Independence: Independent          PT Goals (current goals can now be found in the care plan section) Acute Rehab PT Goals Patient Stated Goal: I want to have a quality of life PT Goal Formulation: With patient Time For Goal Achievement: 07/13/13 Potential to Achieve Goals: Good Progress towards PT goals: Progressing toward goals    Frequency  7X/week    PT Plan Current plan remains appropriate    Co-evaluation             End of Session Equipment Utilized During Treatment: Right knee immobilizer Activity Tolerance: Patient tolerated treatment well;Patient limited by pain Patient left: in bed;with call bell/phone within reach;with family/visitor present     Time: 8469-6295 PT Time Calculation (min): 11 min  Charges:  $Gait Training: 8-22 mins $Therapeutic Exercise: 8-22  mins                    G Codes:      Claretha Cooper 07/11/2013, 2:51 PM

## 2013-07-12 LAB — BASIC METABOLIC PANEL
BUN: 11 mg/dL (ref 6–23)
CO2: 30 mEq/L (ref 19–32)
Calcium: 8.4 mg/dL (ref 8.4–10.5)
Chloride: 101 mEq/L (ref 96–112)
Creatinine, Ser: 0.89 mg/dL (ref 0.50–1.35)
GFR calc Af Amer: >90 mL/min (ref >90)
GFR calc non Af Amer: >90 mL/min (ref >90)
Glucose, Bld: 124 mg/dL — ABNORMAL HIGH (ref 70–99)
Potassium: 3.7 mEq/L (ref 3.7–5.3)
Sodium: 139 mEq/L (ref 137–147)

## 2013-07-12 LAB — CBC
HCT: 33 % — ABNORMAL LOW (ref 39.0–52.0)
Hemoglobin: 11.1 g/dL — ABNORMAL LOW (ref 13.0–17.0)
MCH: 30.9 pg (ref 26.0–34.0)
MCHC: 33.6 g/dL (ref 30.0–36.0)
MCV: 91.9 fL (ref 78.0–100.0)
Platelets: 156 10*3/uL (ref 150–400)
RBC: 3.59 MIL/uL — ABNORMAL LOW (ref 4.22–5.81)
RDW: 13.2 % (ref 11.5–15.5)
WBC: 17.7 10*3/uL — ABNORMAL HIGH (ref 4.0–10.5)

## 2013-07-12 MED ORDER — METHOCARBAMOL 500 MG PO TABS: 500.0000 mg | ORAL_TABLET | Freq: Four times a day (QID) | ORAL | Status: DC | PRN

## 2013-07-12 MED ORDER — OXYCODONE HCL 5 MG PO TABS: 5.0000 mg | ORAL_TABLET | ORAL | Status: DC | PRN

## 2013-07-12 MED ORDER — RIVAROXABAN 10 MG PO TABS: 10.0000 mg | ORAL_TABLET | Freq: Every day | ORAL | Status: DC

## 2013-07-12 MED ORDER — TRAMADOL HCL 50 MG PO TABS: 50.0000 mg | ORAL_TABLET | Freq: Four times a day (QID) | ORAL | Status: DC | PRN

## 2013-07-12 NOTE — Progress Notes (Signed)
Physical Therapy Treatment Patient Details Name: MATTHEWJAMES PETRASEK MRN: [id number] DOB: 12/05/1960 Today's Date: 07/12/2013    History of Present Illness RTKA    PT Comments    Pt with noted edema around R knee and calf, pt reports knee feels tight. Instructed to elevate leg higher than heart at home. Pt is ready for DC.  Follow Up Recommendations  Home health PT     Equipment Recommendations  3in1 (PT)    Recommendations for Other Services       Precautions / Restrictions Precautions Precautions: Knee    Mobility  Bed Mobility Overal bed mobility: Modified Independent                Transfers   Equipment used: Rolling walker (2 wheeled)   Sit to Stand: Supervision            Ambulation/Gait Ambulation/Gait assistance: Supervision Ambulation Distance (Feet): 100 Feet Assistive device: Rolling walker (2 wheeled) Gait Pattern/deviations: Step-through pattern     General Gait Details: pt reports R leg feels tight and  different , cues for sequence,   Stairs Stairs: Yes Stairs assistance: Min guard Stair Management: One rail Left;Step to pattern;Forwards;With crutches Number of Stairs: 2 General stair comments: pt has  been on crutches in the past and feels confident.  Wheelchair Mobility    Modified Rankin (Stroke Patients Only)       Balance                                    Cognition Arousal/Alertness: Awake/alert                          Exercises Total Joint Exercises Quad Sets: AROM;Right;10 reps;Supine Knee Flexion: AROM;Right;5 reps;Seated Goniometric ROM: 15-45    General Comments        Pertinent Vitals/Pain 6-7, premedicated.    Home Living                      Prior Function            PT Goals (current goals can now be found in the care plan section) Progress towards PT goals: Progressing toward goals    Frequency  7X/week    PT Plan Current plan remains appropriate     Co-evaluation             End of Session   Activity Tolerance: Patient tolerated treatment well Patient left: in chair;with call bell/phone within reach;with family/visitor present     Time: 9024-0973 PT Time Calculation (min): 13 min  Charges:  $Gait Training: 8-22 mins                    G Codes:      Claretha Cooper 07/12/2013, 2:32 PM Tresa Endo PT [phone]

## 2013-07-12 NOTE — Discharge Summary (Signed)
Physician Discharge Summary   Patient ID: Dale [name] MRN: [id number] DOB/AGE: [birth year] 53 y.o.  Admit date: 07/10/2013 Discharge date: 07/12/2013  Primary Diagnosis:  Osteoarthritis Right knee(s)  Admission Diagnoses:  Past Medical History  Diagnosis Date  . Allergy   . Depression   . Hyperlipidemia   . History of nephrolithiasis   . Chest pain, non-cardiac     normal stress test on 08-26-09   . GERD (gastroesophageal reflux disease)     TAKES MED  . Arthritis     "SOME IN BACK"  . Anxiety    Discharge Diagnoses:   Principal Problem:   OA (osteoarthritis) of knee Active Problems:   Postoperative anemia due to acute blood loss  Estimated body mass index is 24.91 kg/(m^2) as calculated from the following:   Height as of this encounter: $RemoveBeforeD'5\' 11"'[id number]$  (1.803 m).   Weight as of this encounter: 80.967 kg (178 lb 8 oz).  Procedure:  Procedure(s) (LRB): RIGHT TOTAL KNEE ARTHROPLASTY (Right)   Consults: None  HPI: Dale [name] is a 53 y.o. year old male with end stage OA of his right knee with progressively worsening pain and dysfunction. He has constant pain, with activity and at rest and significant functional deficits with difficulties even with ADLs. He has had extensive non-op management including analgesics, injections of cortisone, and home exercise program, but remains in significant pain with significant dysfunction. He presents now for right Total Knee Arthroplasty.   Laboratory Data: Admission on 07/10/2013, Discharged on 07/12/2013  Component Date Value Ref Range Status  . ABO/RH(D) 07/10/2013 A NEG   Final  . Antibody Screen 07/10/2013 NEG   Final  . Sample Expiration 07/10/2013 07/13/2013   Final  . ABO/RH(D) 07/10/2013 A NEG   Final  . WBC 07/11/2013 17.1* 4.0 - 10.5 K/uL Final  . RBC 07/11/2013 3.80* 4.22 - 5.81 MIL/uL Final  . Hemoglobin 07/11/2013 11.7* 13.0 - 17.0 g/dL Final  . HCT 07/11/2013 34.7* 39.0 - 52.0 % Final  . MCV 07/11/2013 91.3  78.0 - 100.0 fL  Final  . MCH 07/11/2013 30.8  26.0 - 34.0 pg Final  . MCHC 07/11/2013 33.7  30.0 - 36.0 g/dL Final  . RDW 07/11/2013 13.0  11.5 - 15.5 % Final  . Platelets 07/11/2013 166  150 - 400 K/uL Final  . Sodium 07/11/2013 139  137 - 147 mEq/L Final  . Potassium 07/11/2013 4.2  3.7 - 5.3 mEq/L Final  . Chloride 07/11/2013 103  96 - 112 mEq/L Final  . CO2 07/11/2013 25  19 - 32 mEq/L Final  . Glucose, Bld 07/11/2013 167* 70 - 99 mg/dL Final  . BUN 07/11/2013 14  6 - 23 mg/dL Final  . Creatinine, Ser 07/11/2013 0.85  0.50 - 1.35 mg/dL Final  . Calcium 07/11/2013 8.2* 8.4 - 10.5 mg/dL Final  . GFR calc non Af Amer 07/11/2013 >90  >90 mL/min Final  . GFR calc Af Amer 07/11/2013 >90  >90 mL/min Final   Comment: (NOTE)                          The eGFR has been calculated using the CKD EPI equation.                          This calculation has not been validated in all clinical situations.  eGFR's persistently <90 mL/min signify possible Chronic Kidney                          Disease.  . WBC 07/12/2013 17.7* 4.0 - 10.5 K/uL Final  . RBC 07/12/2013 3.59* 4.22 - 5.81 MIL/uL Final  . Hemoglobin 07/12/2013 11.1* 13.0 - 17.0 g/dL Final  . HCT 07/12/2013 33.0* 39.0 - 52.0 % Final  . MCV 07/12/2013 91.9  78.0 - 100.0 fL Final  . MCH 07/12/2013 30.9  26.0 - 34.0 pg Final  . MCHC 07/12/2013 33.6  30.0 - 36.0 g/dL Final  . RDW 07/12/2013 13.2  11.5 - 15.5 % Final  . Platelets 07/12/2013 156  150 - 400 K/uL Final  . Sodium 07/12/2013 139  137 - 147 mEq/L Final  . Potassium 07/12/2013 3.7  3.7 - 5.3 mEq/L Final  . Chloride 07/12/2013 101  96 - 112 mEq/L Final  . CO2 07/12/2013 30  19 - 32 mEq/L Final  . Glucose, Bld 07/12/2013 124* 70 - 99 mg/dL Final  . BUN 07/12/2013 11  6 - 23 mg/dL Final  . Creatinine, Ser 07/12/2013 0.89  0.50 - 1.35 mg/dL Final  . Calcium 07/12/2013 8.4  8.4 - 10.5 mg/dL Final  . GFR calc non Af Amer 07/12/2013 >90  >90 mL/min Final  . GFR calc Af Amer  07/12/2013 >90  >90 mL/min Final   Comment: (NOTE)                          The eGFR has been calculated using the CKD EPI equation.                          This calculation has not been validated in all clinical situations.                          eGFR's persistently <90 mL/min signify possible Chronic Kidney                          Disease.  Hospital Outpatient Visit on 07/06/2013  Component Date Value Ref Range Status  . aPTT 07/06/2013 26  24 - 37 seconds Final  . WBC 07/06/2013 9.5  4.0 - 10.5 K/uL Final  . RBC 07/06/2013 4.86  4.22 - 5.81 MIL/uL Final  . Hemoglobin 07/06/2013 15.3  13.0 - 17.0 g/dL Final  . HCT 07/06/2013 44.5  39.0 - 52.0 % Final  . MCV 07/06/2013 91.6  78.0 - 100.0 fL Final  . MCH 07/06/2013 31.5  26.0 - 34.0 pg Final  . MCHC 07/06/2013 34.4  30.0 - 36.0 g/dL Final  . RDW 07/06/2013 13.1  11.5 - 15.5 % Final  . Platelets 07/06/2013 187  150 - 400 K/uL Final  . Sodium 07/06/2013 140  137 - 147 mEq/L Final  . Potassium 07/06/2013 5.0  3.7 - 5.3 mEq/L Final  . Chloride 07/06/2013 101  96 - 112 mEq/L Final  . CO2 07/06/2013 29  19 - 32 mEq/L Final  . Glucose, Bld 07/06/2013 98  70 - 99 mg/dL Final  . BUN 07/06/2013 16  6 - 23 mg/dL Final  . Creatinine, Ser 07/06/2013 0.91  0.50 - 1.35 mg/dL Final  . Calcium 07/06/2013 9.8  8.4 - 10.5 mg/dL Final  . Total Protein 07/06/2013 7.6  6.0 - 8.3  g/dL Final  . Albumin 07/06/2013 4.2  3.5 - 5.2 g/dL Final  . AST 07/06/2013 24  0 - 37 U/L Final  . ALT 07/06/2013 34  0 - 53 U/L Final  . Alkaline Phosphatase 07/06/2013 83  39 - 117 U/L Final  . Total Bilirubin 07/06/2013 1.1  0.3 - 1.2 mg/dL Final  . GFR calc non Af Amer 07/06/2013 >90  >90 mL/min Final  . GFR calc Af Amer 07/06/2013 >90  >90 mL/min Final   Comment: (NOTE)                          The eGFR has been calculated using the CKD EPI equation.                          This calculation has not been validated in all clinical situations.                           eGFR's persistently <90 mL/min signify possible Chronic Kidney                          Disease.  Marland Kitchen Prothrombin Time 07/06/2013 13.0  11.6 - 15.2 seconds Final  . INR 07/06/2013 1.00  0.00 - 1.49 Final  . Color, Urine 07/06/2013 YELLOW  YELLOW Final  . APPearance 07/06/2013 CLEAR  CLEAR Final  . Specific Gravity, Urine 07/06/2013 1.024  1.005 - 1.030 Final  . pH 07/06/2013 5.5  5.0 - 8.0 Final  . Glucose, UA 07/06/2013 NEGATIVE  NEGATIVE mg/dL Final  . Hgb urine dipstick 07/06/2013 NEGATIVE  NEGATIVE Final  . Bilirubin Urine 07/06/2013 NEGATIVE  NEGATIVE Final  . Ketones, ur 07/06/2013 NEGATIVE  NEGATIVE mg/dL Final  . Protein, ur 07/06/2013 NEGATIVE  NEGATIVE mg/dL Final  . Urobilinogen, UA 07/06/2013 0.2  0.0 - 1.0 mg/dL Final  . Nitrite 07/06/2013 NEGATIVE  NEGATIVE Final  . Leukocytes, UA 07/06/2013 NEGATIVE  NEGATIVE Final   MICROSCOPIC NOT DONE ON URINES WITH NEGATIVE PROTEIN, BLOOD, LEUKOCYTES, NITRITE, OR GLUCOSE <1000 mg/dL.  Marland Kitchen MRSA, PCR 07/06/2013 NEGATIVE  NEGATIVE Final  . Staphylococcus aureus 07/06/2013 NEGATIVE  NEGATIVE Final   Comment:                                 The Xpert SA Assay (FDA                          approved for NASAL specimens                          in patients over 14 years of age),                          is one component of                          a comprehensive surveillance                          program.  Test performance has  been validated by [facility] for patients greater                          than or equal to 10 year old.                          It is not intended                          to diagnose infection nor to                          guide or monitor treatment.  Lab on 05/22/2013  Component Date Value Ref Range Status  . Sodium 05/22/2013 140  135 - 145 mEq/L Final  . Potassium 05/22/2013 3.7  3.5 - 5.1 mEq/L Final  . Chloride 05/22/2013 105  96 - 112 mEq/L Final  .  CO2 05/22/2013 25  19 - 32 mEq/L Final  . Glucose, Bld 05/22/2013 93  70 - 99 mg/dL Final  . BUN 05/22/2013 15  6 - 23 mg/dL Final  . Creatinine, Ser 05/22/2013 0.8  0.4 - 1.5 mg/dL Final  . Calcium 05/22/2013 9.5  8.4 - 10.5 mg/dL Final  . GFR 05/22/2013 108.20  >60.00 mL/min Final  . WBC 05/22/2013 8.5  4.5 - 10.5 K/uL Final  . RBC 05/22/2013 4.94  4.22 - 5.81 Mil/uL Final  . Hemoglobin 05/22/2013 15.3  13.0 - 17.0 g/dL Final  . HCT 05/22/2013 45.6  39.0 - 52.0 % Final  . MCV 05/22/2013 92.2  78.0 - 100.0 fl Final  . MCHC 05/22/2013 33.5  30.0 - 36.0 g/dL Final  . RDW 05/22/2013 14.3  11.5 - 14.6 % Final  . Platelets 05/22/2013 186.0  150.0 - 400.0 K/uL Final  . Neutrophils Relative % 05/22/2013 68.3  43.0 - 77.0 % Final  . Lymphocytes Relative 05/22/2013 21.8  12.0 - 46.0 % Final  . Monocytes Relative 05/22/2013 8.0  3.0 - 12.0 % Final  . Eosinophils Relative 05/22/2013 1.5  0.0 - 5.0 % Final  . Basophils Relative 05/22/2013 0.4  0.0 - 3.0 % Final  . Neutro Abs 05/22/2013 5.8  1.4 - 7.7 K/uL Final  . Lymphs Abs 05/22/2013 1.9  0.7 - 4.0 K/uL Final  . Monocytes Absolute 05/22/2013 0.7  0.1 - 1.0 K/uL Final  . Eosinophils Absolute 05/22/2013 0.1  0.0 - 0.7 K/uL Final  . Basophils Absolute 05/22/2013 0.0  0.0 - 0.1 K/uL Final  . Total Bilirubin 05/22/2013 2.4* 0.3 - 1.2 mg/dL Final  . Bilirubin, Direct 05/22/2013 0.3  0.0 - 0.3 mg/dL Final  . Alkaline Phosphatase 05/22/2013 72  39 - 117 U/L Final  . AST 05/22/2013 23  0 - 37 U/L Final  . ALT 05/22/2013 31  0 - 53 U/L Final  . Total Protein 05/22/2013 7.1  6.0 - 8.3 g/dL Final  . Albumin 05/22/2013 4.5  3.5 - 5.2 g/dL Final  . TSH 05/22/2013 1.55  0.35 - 5.50 uIU/mL Final  . PSA 05/22/2013 0.92  0.10 - 4.00 ng/mL Final  . Color, UA 05/22/2013 yellow   Final  . Clarity, UA 05/22/2013 clear   Final  . Glucose, UA 05/22/2013 n  Final  . Bilirubin, UA 05/22/2013 n   Final  . Ketones, UA 05/22/2013 n   Final  . Spec Grav, UA  05/22/2013 1.020   Final  . Blood, UA 05/22/2013 trace lysed   Final  . pH, UA 05/22/2013 5.5   Final  . Protein, UA 05/22/2013 n   Final  . Urobilinogen, UA 05/22/2013 0.2   Final  . Nitrite, UA 05/22/2013 n   Final  . Leukocytes, UA 05/22/2013 Negative   Final  . Cholesterol 05/22/2013 169  0 - 200 mg/dL Final   ATP III Classification       Desirable:  < 200 mg/dL               Borderline High:  200 - 239 mg/dL          High:  > = 240 mg/dL  . Triglycerides 05/22/2013 100.0  0.0 - 149.0 mg/dL Final   Normal:  <150 mg/dLBorderline High:  150 - 199 mg/dL  . HDL 05/22/2013 59.40  >39.00 mg/dL Final  . VLDL 05/22/2013 20.0  0.0 - 40.0 mg/dL Final  . LDL Cholesterol 05/22/2013 90  0 - 99 mg/dL Final  . Total CHOL/HDL Ratio 05/22/2013 3   Final                  Men          Women1/2 Average Risk     3.4          3.3Average Risk          5.0          4.42X Average Risk          9.6          7.13X Average Risk          15.0          11.0                         X-Rays:Dg Chest 2 View  07/06/2013   CLINICAL DATA:  Preoperative evaluation for RIGHT total knee arthroplasty, cough, GERD  EXAM: CHEST  2 VIEW  COMPARISON:  02/12/2010  FINDINGS: Normal heart size, mediastinal contours, and pulmonary vascularity.  Lungs clear.  No pleural effusion or pneumothorax.  Bones unremarkable.  IMPRESSION: Normal exam.   Electronically Signed   By: Lavonia Dana M.D.   On: 07/06/2013 15:04    EKG: Orders placed in visit on 05/29/13  . EKG 12-LEAD     Hospital Course: Dale [name] is a 53 y.o. who was admitted to [facility]. They were brought to the operating room on 07/10/2013 and underwent Procedure(s): RIGHT TOTAL KNEE ARTHROPLASTY.  Patient tolerated the procedure well and was later transferred to the recovery room and then to the orthopaedic floor for postoperative care.  They were given PO and IV analgesics for pain control following their surgery.  They were given 24 hours of postoperative  antibiotics of  Anti-infectives   Start     Dose/Rate Route Frequency Ordered Stop   07/11/13 1000  levofloxacin (LEVAQUIN) tablet 500 mg  Status:  Discontinued     500 mg Oral Daily 07/10/13 1436 07/12/13 1612   07/11/13 1000  valACYclovir (VALTREX) tablet 500 mg  Status:  Discontinued     500 mg Oral Daily 07/10/13 1436 07/12/13 1612   07/10/13 1800  ceFAZolin (ANCEF) IVPB 2 g/50 mL premix     2  g 100 mL/hr over 30 Minutes Intravenous Every 6 hours 07/10/13 1436 07/10/13 2340   07/10/13 0719  ceFAZolin (ANCEF) IVPB 2 g/50 mL premix     2 g 100 mL/hr over 30 Minutes Intravenous On call to O.R. 07/10/13 0719 07/10/13 1045     and started on DVT prophylaxis in the form of Xarelto.   PT and OT were ordered for total joint protocol.  Discharge planning consulted to help with postop disposition and equipment needs.  Patient had a good night on the evening of surgery.  They started to get up OOB with therapy on day one. Hemovac drain was pulled without difficulty.  Continued to work with therapy into day two.  Dressing was changed on day two and the incision was healing well.  Patient was seen in rounds and was ready to go home on day two following surgery.  Addendum - please note that the patient was taking Levaquin for a sinusitis that he had prior to admission. The patient will continue on the Levaquin will was prescribed by his PCP prior to surgery. Please let this note serve as the notification in order to adhere to SCIP protocols.  Discharge home with home health  Diet - Cardiac diet  Follow up - in 2 weeks  Activity - WBAT  Disposition - Home  Condition Upon Discharge - Good  D/C Meds - See DC Summary  DVT Prophylaxis - Xarelto       Discharge Instructions   Call MD / Call 911    Complete by:  As directed   If you experience chest pain or shortness of breath, CALL 911 and be transported to the hospital emergency room.  If you develope a fever above 101 F, pus (white drainage) or  increased drainage or redness at the wound, or calf pain, call your surgeon's office.     Change dressing    Complete by:  As directed   Change dressing daily with sterile 4 x 4 inch gauze dressing and apply TED hose. Do not submerge the incision under water.     Constipation Prevention    Complete by:  As directed   Drink plenty of fluids.  Prune juice may be helpful.  You may use a stool softener, such as Colace (over the counter) 100 mg twice a day.  Use MiraLax (over the counter) for constipation as needed.     Diet - low sodium heart healthy    Complete by:  As directed      Discharge instructions    Complete by:  As directed   Pick up stool softner and laxative for home. Do not submerge incision under water. May shower. Continue to use ice for pain and swelling from surgery.  Take Xarelto for two and a half more weeks, then discontinue Xarelto. Once the patient has completed the Xarelto, they may resume the 81 mg Aspirin.     Do not put a pillow under the knee. Place it under the heel.    Complete by:  As directed      Do not sit on low chairs, stoools or toilet seats, as it may be difficult to get up from low surfaces    Complete by:  As directed      Driving restrictions    Complete by:  As directed   No driving until released by the physician.     Increase activity slowly as tolerated    Complete by:  As directed  Lifting restrictions    Complete by:  As directed   No lifting until released by the physician.     Patient may shower    Complete by:  As directed   You may shower without a dressing once there is no drainage.  Do not wash over the wound.  If drainage remains, do not shower until drainage stops.     TED hose    Complete by:  As directed   Use stockings (TED hose) for 3 weeks on both leg(s).  You may remove them at night for sleeping.     Weight bearing as tolerated    Complete by:  As directed             Medication List    STOP taking these  medications       aspirin EC 81 MG tablet     GLUCOSAMINE-CHONDROITIN PO     multivitamin with minerals Tabs tablet     sildenafil 100 MG tablet  Commonly known as:  VIAGRA      TAKE these medications       atorvastatin 10 MG tablet  Commonly known as:  LIPITOR  Take 1 tablet (10 mg total) by mouth every morning.     levofloxacin 500 MG tablet  Commonly known as:  LEVAQUIN  Take 1 tablet (500 mg total) by mouth daily.     loratadine-pseudoephedrine 10-240 MG per 24 hr tablet  Commonly known as:  CLARITIN-D 24-hour  Take 1 tablet by mouth daily.     LORazepam 2 MG tablet  Commonly known as:  ATIVAN  Take 2-3 mg by mouth 2 (two) times daily as needed for anxiety. Takes $RemoveBefore'3mg'[id number]$  at bedtime and another one throughout day if needed     omeprazole 20 MG capsule  Commonly known as:  PRILOSEC  Take 20 mg by mouth every morning.     rivaroxaban 10 MG Tabs tablet  Commonly known as:  XARELTO  - Take 1 tablet (10 mg total) by mouth daily with breakfast. Take Xarelto for two and a half more weeks, then discontinue Xarelto.  - Once the patient has completed the Xarelto, they may resume the 81 mg Aspirin.     valACYclovir 500 MG tablet  Commonly known as:  VALTREX  Take 1 tablet (500 mg total) by mouth daily.       Follow-up Information   Follow up with Gearlean Alf, MD. Schedule an appointment as soon as possible for a visit on 07/27/2013. (Call [phone] tomorrow to make the appointment)    Specialty:  Orthopedic Surgery   Contact information:   [address] [place] [address] [phone]       Signed: Arlee Muslim, PA-C Orthopaedic Surgery 07/27/2013, 8:09 AM

## 2013-07-12 NOTE — Progress Notes (Signed)
   Subjective: 2 Days Post-Op Procedure(s) (LRB): RIGHT TOTAL KNEE ARTHROPLASTY (Right) Patient reports pain as mild.   Patient seen in rounds with Dr. Wynelle Link. Patient is well, and has had no acute complaints or problems Patient is ready to go home  Objective: Vital signs in last 24 hours: Temp:  [98.1 F (36.7 C)-98.6 F (37 C)] 98.1 F (36.7 C) (06/24 0635) Pulse Rate:  [83-90] 90 (06/24 0635) Resp:  [16] 16 (06/24 0800) BP: (143-150)/(87-91) 143/87 mmHg (06/24 0635) SpO2:  [97 %-98 %] 98 % (06/24 0800)  Intake/Output from previous day:  Intake/Output Summary (Last 24 hours) at 07/12/13 1107 Last data filed at 07/12/13 0910  Gross per 24 hour  Intake    600 ml  Output      0 ml  Net    600 ml    Intake/Output this shift: Total I/O In: 240 [P.O.:240] Out: -   Labs:  Recent Labs  07/11/13 0426 07/12/13 0539  HGB 11.7* 11.1*    Recent Labs  07/11/13 0426 07/12/13 0539  WBC 17.1* 17.7*  RBC 3.80* 3.59*  HCT 34.7* 33.0*  PLT 166 156    Recent Labs  07/11/13 0426 07/12/13 0539  NA 139 139  K 4.2 3.7  CL 103 101  CO2 25 30  BUN 14 11  CREATININE 0.85 0.89  GLUCOSE 167* 124*  CALCIUM 8.2* 8.4   No results found for this basename: LABPT, INR,  in the last 72 hours  EXAM: General - Patient is Alert, Appropriate and Oriented Extremity - Neurovascular intact Sensation intact distally Incision - clean, dry, no drainage Motor Function - intact, moving foot and toes well on exam.   Assessment/Plan: 2 Days Post-Op Procedure(s) (LRB): RIGHT TOTAL KNEE ARTHROPLASTY (Right) Procedure(s) (LRB): RIGHT TOTAL KNEE ARTHROPLASTY (Right) Past Medical History  Diagnosis Date  . Allergy   . Depression   . Hyperlipidemia   . History of nephrolithiasis   . Chest pain, non-cardiac     normal stress test on 08-26-09   . GERD (gastroesophageal reflux disease)     TAKES MED  . Arthritis     "SOME IN BACK"  . Anxiety    Principal Problem:   OA  (osteoarthritis) of knee Active Problems:   Postoperative anemia due to acute blood loss  Estimated body mass index is 24.91 kg/(m^2) as calculated from the following:   Height as of this encounter: 5\' 11"  (1.803 m).   Weight as of this encounter: 80.967 kg (178 lb 8 oz). Up with therapy Discharge home with home health Diet - Cardiac diet Follow up - in 2 weeks Activity - WBAT Disposition - Home Condition Upon Discharge - Good D/C Meds - See DC Summary DVT Prophylaxis - Xarelto  Addendum - please note that the patient is currently taking Levaquin for a sinusitis that he had prior to admission. The patient will continue on the Levaquin will was prescribed by his PCP prior to surgery. Please let this note serve as the notification in order to adhere to SCIP protocols.  Arlee Muslim, PA-C Orthopaedic Surgery 07/12/2013, 11:07 AM

## 2013-07-12 NOTE — Plan of Care (Signed)
Problem: Discharge Progression Outcomes Goal: Anticoagulant follow-up in place Outcome: Not Applicable Date Met:  07/12/13 xarelto     

## 2013-07-12 NOTE — Progress Notes (Signed)
Discharged from floor via w/c, wife with pt. No changes in assessment. Council, [place]  

## 2013-07-16 ENCOUNTER — Emergency Department (HOSPITAL_COMMUNITY): Payer: 59

## 2013-07-16 ENCOUNTER — Encounter (HOSPITAL_COMMUNITY): Payer: Self-pay | Admitting: Emergency Medicine

## 2013-07-16 ENCOUNTER — Emergency Department (HOSPITAL_COMMUNITY)
Admission: EM | Admit: 2013-07-16 | Discharge: 2013-07-16 | Disposition: A | Discharge status: Home / Self Care | Payer: 59 | Attending: Emergency Medicine | Admitting: Emergency Medicine

## 2013-07-16 DIAGNOSIS — G8918 Other acute postprocedural pain: Secondary | ICD-10-CM

## 2013-07-16 DIAGNOSIS — Z79899 Other long term (current) drug therapy: Secondary | ICD-10-CM | POA: Insufficient documentation

## 2013-07-16 DIAGNOSIS — F411 Generalized anxiety disorder: Secondary | ICD-10-CM | POA: Insufficient documentation

## 2013-07-16 DIAGNOSIS — Z7901 Long term (current) use of anticoagulants: Secondary | ICD-10-CM | POA: Insufficient documentation

## 2013-07-16 DIAGNOSIS — M25561 Pain in right knee: Secondary | ICD-10-CM

## 2013-07-16 DIAGNOSIS — Z96659 Presence of unspecified artificial knee joint: Secondary | ICD-10-CM | POA: Insufficient documentation

## 2013-07-16 DIAGNOSIS — K219 Gastro-esophageal reflux disease without esophagitis: Secondary | ICD-10-CM | POA: Insufficient documentation

## 2013-07-16 DIAGNOSIS — Z792 Long term (current) use of antibiotics: Secondary | ICD-10-CM | POA: Insufficient documentation

## 2013-07-16 DIAGNOSIS — R5082 Postprocedural fever: Secondary | ICD-10-CM | POA: Insufficient documentation

## 2013-07-16 DIAGNOSIS — Z87442 Personal history of urinary calculi: Secondary | ICD-10-CM | POA: Insufficient documentation

## 2013-07-16 DIAGNOSIS — F3289 Other specified depressive episodes: Secondary | ICD-10-CM | POA: Insufficient documentation

## 2013-07-16 DIAGNOSIS — R079 Chest pain, unspecified: Secondary | ICD-10-CM

## 2013-07-16 DIAGNOSIS — R509 Fever, unspecified: Secondary | ICD-10-CM

## 2013-07-16 DIAGNOSIS — F329 Major depressive disorder, single episode, unspecified: Secondary | ICD-10-CM | POA: Insufficient documentation

## 2013-07-16 DIAGNOSIS — E785 Hyperlipidemia, unspecified: Secondary | ICD-10-CM | POA: Insufficient documentation

## 2013-07-16 DIAGNOSIS — M79609 Pain in unspecified limb: Secondary | ICD-10-CM

## 2013-07-16 DIAGNOSIS — M7989 Other specified soft tissue disorders: Secondary | ICD-10-CM

## 2013-07-16 DIAGNOSIS — M7981 Nontraumatic hematoma of soft tissue: Secondary | ICD-10-CM | POA: Insufficient documentation

## 2013-07-16 DIAGNOSIS — M129 Arthropathy, unspecified: Secondary | ICD-10-CM | POA: Insufficient documentation

## 2013-07-16 DIAGNOSIS — M25569 Pain in unspecified knee: Secondary | ICD-10-CM | POA: Insufficient documentation

## 2013-07-16 DIAGNOSIS — R609 Edema, unspecified: Secondary | ICD-10-CM | POA: Insufficient documentation

## 2013-07-16 LAB — I-STAT CG4 LACTIC ACID, ED: Lactic Acid, Venous: 2.6 mmol/L — ABNORMAL HIGH (ref 0.5–2.2)

## 2013-07-16 LAB — COMPREHENSIVE METABOLIC PANEL
ALT: 36 U/L (ref 0–53)
AST: 44 U/L — ABNORMAL HIGH (ref 0–37)
Albumin: 3.3 g/dL — ABNORMAL LOW (ref 3.5–5.2)
Alkaline Phosphatase: 104 U/L (ref 39–117)
BUN: 24 mg/dL — ABNORMAL HIGH (ref 6–23)
CO2: 24 mEq/L (ref 19–32)
Calcium: 9.5 mg/dL (ref 8.4–10.5)
Chloride: 91 mEq/L — ABNORMAL LOW (ref 96–112)
Creatinine, Ser: 0.9 mg/dL (ref 0.50–1.35)
GFR calc Af Amer: >90 mL/min (ref >90)
GFR calc non Af Amer: >90 mL/min (ref >90)
Glucose, Bld: 121 mg/dL — ABNORMAL HIGH (ref 70–99)
Potassium: 4.6 mEq/L (ref 3.7–5.3)
Sodium: 133 mEq/L — ABNORMAL LOW (ref 137–147)
Total Bilirubin: 2.1 mg/dL — ABNORMAL HIGH (ref 0.3–1.2)
Total Protein: 8.2 g/dL (ref 6.0–8.3)

## 2013-07-16 LAB — CBC WITH DIFFERENTIAL/PLATELET
Basophils Absolute: 0 10*3/uL (ref 0.0–0.1)
Basophils Relative: 0 % (ref 0–1)
Eosinophils Absolute: 0.2 10*3/uL (ref 0.0–0.7)
Eosinophils Relative: 1 % (ref 0–5)
HCT: 32.4 % — ABNORMAL LOW (ref 39.0–52.0)
Hemoglobin: 11.3 g/dL — ABNORMAL LOW (ref 13.0–17.0)
Lymphocytes Relative: 8 % — ABNORMAL LOW (ref 12–46)
Lymphs Abs: 1.1 10*3/uL (ref 0.7–4.0)
MCH: 31 pg (ref 26.0–34.0)
MCHC: 34.9 g/dL (ref 30.0–36.0)
MCV: 89 fL (ref 78.0–100.0)
Monocytes Absolute: 1.9 10*3/uL — ABNORMAL HIGH (ref 0.1–1.0)
Monocytes Relative: 14 % — ABNORMAL HIGH (ref 3–12)
Neutro Abs: 11.1 10*3/uL — ABNORMAL HIGH (ref 1.7–7.7)
Neutrophils Relative %: 77 % (ref 43–77)
Platelets: 283 10*3/uL (ref 150–400)
RBC: 3.64 MIL/uL — ABNORMAL LOW (ref 4.22–5.81)
RDW: 12.4 % (ref 11.5–15.5)
WBC: 14.3 10*3/uL — ABNORMAL HIGH (ref 4.0–10.5)

## 2013-07-16 LAB — URINALYSIS, ROUTINE W REFLEX MICROSCOPIC
Bilirubin Urine: NEGATIVE
Glucose, UA: NEGATIVE mg/dL
Ketones, ur: 15 mg/dL — AB
Leukocytes, UA: NEGATIVE
Nitrite: NEGATIVE
Protein, ur: NEGATIVE mg/dL
Specific Gravity, Urine: 1.028 (ref 1.005–1.030)
Urobilinogen, UA: 0.2 mg/dL (ref 0.0–1.0)
pH: 6 (ref 5.0–8.0)

## 2013-07-16 LAB — URINE MICROSCOPIC-ADD ON

## 2013-07-16 MED ORDER — DOXYCYCLINE HYCLATE 100 MG PO TABS: 100.0000 mg | ORAL_TABLET | Freq: Two times a day (BID) | ORAL | Status: DC

## 2013-07-16 MED ORDER — HYDROMORPHONE HCL PF 1 MG/ML IJ SOLN
1.0000 mg | Freq: Once | INTRAMUSCULAR | Status: AC
  Administered 2013-07-16: 1 mg via INTRAVENOUS
  Filled 2013-07-16: qty 1

## 2013-07-16 MED ORDER — ONDANSETRON HCL 4 MG/2ML IJ SOLN
4.0000 mg | Freq: Once | INTRAMUSCULAR | Status: AC
  Administered 2013-07-16: 4 mg via INTRAVENOUS
  Filled 2013-07-16: qty 2

## 2013-07-16 MED ORDER — BUPIVACAINE HCL (PF) 0.5 % IJ SOLN
10.0000 mL | Freq: Once | INTRAMUSCULAR | Status: AC
  Administered 2013-07-16: 10 mL
  Filled 2013-07-16: qty 30

## 2013-07-16 MED ORDER — SODIUM CHLORIDE 0.9 % IV BOLUS (SEPSIS)
1000.0000 mL | Freq: Once | INTRAVENOUS | Status: AC
  Administered 2013-07-16: 1000 mL via INTRAVENOUS

## 2013-07-16 NOTE — Consult Note (Signed)
Reason for Consult:Pain and swelling of Right Knee. Referring Physician: [name] is an 53 y.o. male.  HPI: Patient had [name] Right Total Knee by Dr. Wynelle Link 6-days ago and developed [name] fever and pain in Right Knee,  Past Medical History  Diagnosis Date  . Allergy   . Depression   . Hyperlipidemia   . History of nephrolithiasis   . Chest pain, non-cardiac     normal stress test on 08-26-09   . GERD (gastroesophageal reflux disease)     TAKES MED  . Arthritis     "SOME IN BACK"  . Anxiety     Past Surgical History  Procedure Laterality Date  . Knee surgery  83, 89, 90, 01, 02,14    rt knee x 6  . Knee surgery  2007    left knee meniscus repair  . Rotator cuff repair  2003  . Posterior laminectomy / decompression lumbar spine  12-21-11    per Dr. Earle Gell   . Back surgery    . Knee arthroscopy Right 01/09/2013    Procedure: ARTHROSCOPY KNEE;  Surgeon: Marybelle Killings, MD;  Location: [facility];  Service: Orthopedics;  Laterality: Right;  Right knee scope, partial medial menisectomy  . Colonoscopy  05-04-13    per Dr. Hilarie Fredrickson, adenomatous polyps, repeat in 5 yrs   . Total knee arthroplasty Right 07/10/2013    Procedure: RIGHT TOTAL KNEE ARTHROPLASTY;  Surgeon: Gearlean Alf, MD;  Location: [facility];  Service: Orthopedics;  Laterality: Right;    Family History  Problem Relation Age of Onset  . Heart attack Father   . Coronary artery disease Father   . Depression      family hx  . Hyperlipidemia      family hx  . Hypertension      family hx  . Lung cancer      family hx  . Stroke      family hx  . Colon cancer Neg Hx   . Esophageal cancer Neg Hx   . Rectal cancer Neg Hx   . Stomach cancer Neg Hx     Social History:  reports that he has never smoked. He has never used smokeless tobacco. He reports that he drinks alcohol. He reports that he does not use illicit drugs.  Allergies: No Known Allergies  Medications: I have reviewed the patient's current  medications.  Results for orders placed during the hospital encounter of 07/16/13 (from the past 48 hour(s))  CBC WITH DIFFERENTIAL     Status: Abnormal   Collection Time    07/16/13  9:36 AM      Result Value Ref Range   WBC 14.3 (*) 4.0 - 10.5 K/uL   RBC 3.64 (*) 4.22 - 5.81 MIL/uL   Hemoglobin 11.3 (*) 13.0 - 17.0 g/dL   HCT 32.4 (*) 39.0 - 52.0 %   MCV 89.0  78.0 - 100.0 fL   MCH 31.0  26.0 - 34.0 pg   MCHC 34.9  30.0 - 36.0 g/dL   RDW 12.4  11.5 - 15.5 %   Platelets 283  150 - 400 K/uL   Neutrophils Relative % 77  43 - 77 %   Neutro Abs 11.1 (*) 1.7 - 7.7 K/uL   Lymphocytes Relative 8 (*) 12 - 46 %   Lymphs Abs 1.1  0.7 - 4.0 K/uL   Monocytes Relative 14 (*) 3 - 12 %   Monocytes Absolute 1.9 (*) 0.1 -  1.0 K/uL   Eosinophils Relative 1  0 - 5 %   Eosinophils Absolute 0.2  0.0 - 0.7 K/uL   Basophils Relative 0  0 - 1 %   Basophils Absolute 0.0  0.0 - 0.1 K/uL  COMPREHENSIVE METABOLIC PANEL     Status: Abnormal   Collection Time    07/16/13  9:36 AM      Result Value Ref Range   Sodium 133 (*) 137 - 147 mEq/L   Potassium 4.6  3.7 - 5.3 mEq/L   Chloride 91 (*) 96 - 112 mEq/L   CO2 24  19 - 32 mEq/L   Glucose, Bld 121 (*) 70 - 99 mg/dL   BUN 24 (*) 6 - 23 mg/dL   Creatinine, Ser 0.90  0.50 - 1.35 mg/dL   Calcium 9.5  8.4 - 10.5 mg/dL   Total Protein 8.2  6.0 - 8.3 g/dL   Albumin 3.3 (*) 3.5 - 5.2 g/dL   AST 44 (*) 0 - 37 U/L   Comment: SLIGHT HEMOLYSIS   ALT 36  0 - 53 U/L   Alkaline Phosphatase 104  39 - 117 U/L   Total Bilirubin 2.1 (*) 0.3 - 1.2 mg/dL   GFR calc non Af Amer >90  >90 mL/min   GFR calc Af Amer >90  >90 mL/min   Comment: (NOTE)     The eGFR has been calculated using the CKD EPI equation.     This calculation has not been validated in all clinical situations.     eGFR's persistently <90 mL/min signify possible Chronic Kidney     Disease.  I-STAT CG4 LACTIC ACID, ED     Status: Abnormal   Collection Time    07/16/13 11:43 AM      Result Value Ref  Range   Lactic Acid, Venous 2.60 (*) 0.5 - 2.2 mmol/L    Dg Chest 2 View  07/16/2013   CLINICAL DATA:  fever post op  EXAM: CHEST - 2 VIEW  COMPARISON:  07/06/2013  FINDINGS: Lungs are clear. Heart size and mediastinal contours are within normal limits. No effusion. Visualized skeletal structures are unremarkable.  IMPRESSION: No acute cardiopulmonary disease.   Electronically Signed   By: Arne Cleveland M.D.   On: 07/16/2013 10:43   Dg Knee Complete 4 Views Right  07/16/2013   CLINICAL DATA:  post op fever, s/p knee replacement  EXAM: RIGHT KNEE - COMPLETE 4+ VIEW  COMPARISON:  None.  FINDINGS: Components of knee arthroplasty project in expected location. Anterior soft tissue swelling and subcutaneous gas bubbles. Negative for fracture or dislocation.  IMPRESSION: Right knee arthroplasty with effusion.   Electronically Signed   By: Arne Cleveland M.D.   On: 07/16/2013 10:43    Review of Systems  Constitutional: Positive for fever.  HENT: Negative.   Eyes: Negative.   Respiratory: Negative.   Cardiovascular: Negative.   Gastrointestinal: Negative.   Genitourinary: Negative.   Musculoskeletal:       Erythema and swelling of Right Total Knee.  Skin:       Pressure Blisters over Knee wound  Neurological: Negative.   Endo/Heme/Allergies: Negative.   Psychiatric/Behavioral: Negative.    Blood pressure 145/81, pulse 104, temperature 97.8 F (36.6 C), temperature source Oral, resp. rate 20, SpO2 100.00%. Physical Exam  Constitutional: He appears well-developed.  HENT:  Head: Normocephalic.  Eyes: Pupils are equal, round, and reactive to light.  Neck: Normal range of motion.  Cardiovascular: Normal rate.   Respiratory:  Effort normal.  GI: Soft.  Musculoskeletal:  Hemarthrosis right Knee with Erythema.  Neurological: He is alert.  Skin: There is erythema.    Assessment/Plan: Right Knee aspirated under [employer]. Blood only aspirated,no signs of an infection. C& S sent of  right knee. Blood C&S also sent.Will start on Doxycycline.  Dale [name],Dale [name] 07/16/2013, 12:30 PM

## 2013-07-16 NOTE — ED Notes (Signed)
Lab phlebotomy called for blood culture draw.

## 2013-07-16 NOTE — ED Notes (Signed)
Pt from home c/o fever and knee pain after having R knee replacement surgery 6 days ago. Pt reports pain, swelling to R knee that is unrelieved by pain meds. Pt denies CP, SOB, N/V,D, urinary s/sx. Pt is A&O and in NAD. Pt wife at bedside

## 2013-07-16 NOTE — ED Provider Notes (Addendum)
Pt presents to the ED with complaints of fever and increasing knee pain.  S/p total knee replacement 6 days ago.  NO cough.  No dysuria.  Physical Exam  BP 151/92  Pulse 94  Temp(Src) 98.3 F (36.8 C) (Oral)  Resp 20  SpO2 100%  Physical Exam  Nursing note and vitals reviewed. Constitutional: He appears well-developed and well-nourished. No distress.  HENT:  Head: Normocephalic and atraumatic.  Right Ear: External ear normal.  Left Ear: External ear normal.  Eyes: Conjunctivae are normal. Right eye exhibits no discharge. Left eye exhibits no discharge. No scleral icterus.  Neck: Neck supple. No tracheal deviation present.  Cardiovascular: Normal rate.   Pulmonary/Chest: Effort normal. No stridor. No respiratory distress.  Musculoskeletal: He exhibits edema and tenderness.       Right knee: He exhibits decreased range of motion, swelling, effusion, ecchymosis and erythema. He exhibits no laceration. Tenderness found.       Right upper leg: He exhibits swelling and edema.       Right lower leg: He exhibits swelling and edema.  Neurological: He is alert. Cranial nerve deficit: no gross deficits.  Skin: Skin is warm and dry. No rash noted.  Psychiatric: He has a normal mood and affect.    ED Course  Procedures  MDM Pt with increased swelling, fever at home.  Will check labs, ua and CXR.  Concerning for possible post op infection.  On xarelto.  DVT unlikely. Plan on consult with ortho.      Dorie Rank, MD 07/16/13 1002  Dr Gladstone Lighter saw the patient.  Aspirated blood from the joint.  Doubt infection.  Dc home with doxycyline.  Dorie Rank, MD 07/16/13 [phone]

## 2013-07-16 NOTE — ED Notes (Signed)
IV d/c, catheter withdrawn intact, no bleeding, pt tol well.

## 2013-07-16 NOTE — Progress Notes (Signed)
Right lower extremity venous duplex completed.  Right:  No evidence of DVT, superficial thrombosis, or Baker's cyst.  Left:  Negative for DVT in the common femoral vein.  

## 2013-07-16 NOTE — ED Provider Notes (Signed)
CSN: [id number]     Arrival date & time 07/16/13  1696 History   First MD Initiated Contact with Patient 07/16/13 [phone]     Chief Complaint  Patient presents with  . Post-op Problem  . Knee Pain  . Fever     (Consider location/radiation/quality/duration/timing/severity/associated sxs/prior Treatment) HPI Comments: Patient is a 53 yo M PMHx significant for HLD, GERD, Anxiety, Arthritis presenting to the ED for two to three days of fever (TMAX 102F) six days post op from total right knee replacement. Patient states he has had increased swelling, pain, redness to the right knee after the last few days. Patient has been taking Dilaudid PO with little to improvement in his pain. Last took Tylenol prior to arrival. Due to follow up with Dr. Maureen Ralphs tomorrow. Denies any CP, SOB, coughing, URI symptoms, abdominal pain, emesis, diarrhea.  Patient is a 53 y.o. male presenting with knee pain and fever.  Knee Pain Associated symptoms: fatigue and fever   Fever Associated symptoms: chills, myalgias and nausea   Associated symptoms: no chest pain and no vomiting     Past Medical History  Diagnosis Date  . Allergy   . Depression   . Hyperlipidemia   . History of nephrolithiasis   . Chest pain, non-cardiac     normal stress test on 08-26-09   . GERD (gastroesophageal reflux disease)     TAKES MED  . Arthritis     "SOME IN BACK"  . Anxiety    Past Surgical History  Procedure Laterality Date  . Knee surgery  83, 89, 90, 01, 02,14    rt knee x 6  . Knee surgery  2007    left knee meniscus repair  . Rotator cuff repair  2003  . Posterior laminectomy / decompression lumbar spine  12-21-11    per Dr. Earle Gell   . Back surgery    . Knee arthroscopy Right 01/09/2013    Procedure: ARTHROSCOPY KNEE;  Surgeon: Marybelle Killings, MD;  Location: [facility];  Service: Orthopedics;  Laterality: Right;  Right knee scope, partial medial menisectomy  . Colonoscopy  05-04-13    per Dr. Hilarie Fredrickson, adenomatous polyps,  repeat in 5 yrs   . Total knee arthroplasty Right 07/10/2013    Procedure: RIGHT TOTAL KNEE ARTHROPLASTY;  Surgeon: Gearlean Alf, MD;  Location: [facility];  Service: Orthopedics;  Laterality: Right;   Family History  Problem Relation Age of Onset  . Heart attack Father   . Coronary artery disease Father   . Depression      family hx  . Hyperlipidemia      family hx  . Hypertension      family hx  . Lung cancer      family hx  . Stroke      family hx  . Colon cancer Neg Hx   . Esophageal cancer Neg Hx   . Rectal cancer Neg Hx   . Stomach cancer Neg Hx    History  Substance Use Topics  . Smoking status: Never Smoker   . Smokeless tobacco: Never Used  . Alcohol Use: 0.0 oz/week     Comment: occ    Review of Systems  Constitutional: Positive for fever, chills, appetite change and fatigue.  Respiratory: Negative for shortness of breath.   Cardiovascular: Negative for chest pain.  Gastrointestinal: Positive for nausea. Negative for vomiting and abdominal pain.  Musculoskeletal: Positive for arthralgias, joint swelling and myalgias.  Skin: Positive for color change.  All other systems reviewed and are negative.     Allergies  Review of patient's allergies indicates no known allergies.  Home Medications   Prior to Admission medications   Medication Sig Start Date End Date Taking? Authorizing Provider  atorvastatin (LIPITOR) 10 MG tablet Take 1 tablet (10 mg total) by mouth every morning. 05/29/13  Yes Laurey Morale, MD  HYDROmorphone (DILAUDID) 2 MG tablet Take 2-4 mg by mouth every 6 (six) hours as needed for severe pain.  07/15/13  Yes Historical Provider, MD  levofloxacin (LEVAQUIN) 500 MG tablet Take 500 mg by mouth daily. For 10 days 07/04/13  Yes Historical Provider, MD  loratadine-pseudoephedrine (CLARITIN-D 24-HOUR) 10-240 MG per 24 hr tablet Take 1 tablet by mouth daily.    Yes Historical Provider, MD  LORazepam (ATIVAN) 2 MG tablet Take 2-3 mg by mouth 2 (two)  times daily as needed for anxiety. Takes 3mg  at bedtime and another one throughout day if needed   Yes Historical Provider, MD  methocarbamol (ROBAXIN) 500 MG tablet Take 500 mg by mouth every 6 (six) hours as needed for muscle spasms.   Yes Historical Provider, MD  omeprazole (PRILOSEC) 20 MG capsule Take 20 mg by mouth every morning.    Yes Historical Provider, MD  oxyCODONE (OXY IR/ROXICODONE) 5 MG immediate release tablet Take 5-20 mg by mouth every 4 (four) hours as needed for moderate pain.    Yes Historical Provider, MD  rivaroxaban (XARELTO) 10 MG TABS tablet Take 1 tablet (10 mg total) by mouth daily with breakfast. Take Xarelto for two and a half more weeks, then discontinue Xarelto. Once the patient has completed the Xarelto, they may resume the 81 mg Aspirin. 07/12/13  Yes Arlee Muslim, PA-C  valACYclovir (VALTREX) 500 MG tablet Take 1 tablet (500 mg total) by mouth daily. 07/04/13  Yes Laurey Morale, MD  VIAGRA 100 MG tablet Take 100 mg by mouth daily as needed for erectile dysfunction.  06/11/13  Yes Historical Provider, MD  doxycycline (VIBRA-TABS) 100 MG tablet Take 1 tablet (100 mg total) by mouth 2 (two) times daily. 07/16/13   Dorie Rank, MD   BP 145/81  Pulse 104  Temp(Src) 97.8 F (36.6 C) (Oral)  Resp 20  SpO2 100% Physical Exam  Nursing note and vitals reviewed. Constitutional: He is oriented to person, place, and time. He appears well-developed and well-nourished. No distress.  HENT:  Head: Normocephalic and atraumatic.  Right Ear: External ear normal.  Left Ear: External ear normal.  Nose: Nose normal.  Mouth/Throat: Oropharynx is clear and moist.  Eyes: Conjunctivae are normal.  Neck: Normal range of motion. Neck supple.  Cardiovascular: Normal rate, regular rhythm, normal heart sounds and intact distal pulses.   Pulmonary/Chest: Effort normal and breath sounds normal. No respiratory distress.  Abdominal: Soft. Bowel sounds are normal. There is no tenderness.    Musculoskeletal: He exhibits edema and tenderness.       Right hip: Normal.       Left hip: Normal.       Right knee: He exhibits decreased range of motion, swelling and ecchymosis. Tenderness found.       Left knee: Normal.       Right ankle: He exhibits swelling. He exhibits normal range of motion and no ecchymosis. No tenderness.       Left ankle: Normal.       Right upper leg: He exhibits tenderness and swelling.       Left upper leg: Normal.  Right lower leg: He exhibits tenderness and swelling.       Left lower leg: Normal.       Legs:      Right foot: Normal.       Left foot: Normal.  Neurological: He is alert and oriented to person, place, and time.  Skin: Skin is warm and dry. He is not diaphoretic.  Psychiatric: He has a normal mood and affect.    ED Course  Procedures (including critical care time) Medications  sodium chloride 0.9 % bolus 1,000 mL (1,000 mLs Intravenous New Bag/Given 07/16/13 1030)  HYDROmorphone (DILAUDID) injection 1 mg (1 mg Intravenous Given 07/16/13 1030)  ondansetron (ZOFRAN) injection 4 mg (4 mg Intravenous Given 07/16/13 1030)  bupivacaine (MARCAINE) 0.5 % injection 10 mL (10 mLs Infiltration Given by Other 07/16/13 1240)    Labs Review Labs Reviewed  CBC WITH DIFFERENTIAL - Abnormal; Notable for the following:    WBC 14.3 (*)    RBC 3.64 (*)    Hemoglobin 11.3 (*)    HCT 32.4 (*)    Neutro Abs 11.1 (*)    Lymphocytes Relative 8 (*)    Monocytes Relative 14 (*)    Monocytes Absolute 1.9 (*)    All other components within normal limits  COMPREHENSIVE METABOLIC PANEL - Abnormal; Notable for the following:    Sodium 133 (*)    Chloride 91 (*)    Glucose, Bld 121 (*)    BUN 24 (*)    Albumin 3.3 (*)    AST 44 (*)    Total Bilirubin 2.1 (*)    All other components within normal limits  URINALYSIS, ROUTINE W REFLEX MICROSCOPIC - Abnormal; Notable for the following:    Color, Urine AMBER (*)    Hgb urine dipstick SMALL (*)     Ketones, ur 15 (*)    All other components within normal limits  I-STAT CG4 LACTIC ACID, ED - Abnormal; Notable for the following:    Lactic Acid, Venous 2.60 (*)    All other components within normal limits  CULTURE, BLOOD (ROUTINE X 2)  CULTURE, BLOOD (ROUTINE X 2)  ANAEROBIC CULTURE  CULTURE, BODY FLUID-BOTTLE  URINE MICROSCOPIC-ADD ON    Imaging Review Dg Chest 2 View  07/16/2013   CLINICAL DATA:  fever post op  EXAM: CHEST - 2 VIEW  COMPARISON:  07/06/2013  FINDINGS: Lungs are clear. Heart size and mediastinal contours are within normal limits. No effusion. Visualized skeletal structures are unremarkable.  IMPRESSION: No acute cardiopulmonary disease.   Electronically Signed   By: Arne Cleveland M.D.   On: 07/16/2013 10:43   Dg Knee Complete 4 Views Right  07/16/2013   CLINICAL DATA:  post op fever, s/p knee replacement  EXAM: RIGHT KNEE - COMPLETE 4+ VIEW  COMPARISON:  None.  FINDINGS: Components of knee arthroplasty project in expected location. Anterior soft tissue swelling and subcutaneous gas bubbles. Negative for fracture or dislocation.  IMPRESSION: Right knee arthroplasty with effusion.   Electronically Signed   By: Arne Cleveland M.D.   On: 07/16/2013 10:43     EKG Interpretation None      MDM   Final diagnoses:  Fever in adult  Postoperative pain of right knee   Filed Vitals:   07/16/13 1203  BP: 145/81  Pulse: 104  Temp: 97.8 F (36.6 C)  Resp: 20   Afebrile, NAD, non-toxic appearing, AAOx4.   Patient presenting six days after a total right knee replacement with complaint  of fever increased pain, swelling to leg. Incision is clean, dry, intact with no purulent drainage. The knee is erythematous and warm with significant swelling to right leg. Neurovascularly intact. Normal sensation. I have reviewed nursing notes, vital signs, and all appropriate lab and imaging results for this patient. Mild leukocytosis noted. Chest x-ray and urine are unremarkable for  source of fever. The x-ray consistent with right knee arthroplasty with effusion. Dr. Gladstone Lighter office consult, he came and evaluated the patient and performed an arthrocentesis. Place patient on antibiotic with advice to followup and return precautions given. His pain and symptoms improved while in the ED. Advised patient keep his scheduled follow up appointment with Dr. Maureen Ralphs.  Patient agreeable to plan. Patient is stable at time of discharge. Patient d/w with Dr. Tomi Bamberger, agrees with plan.          Harlow Mares, PA-C 07/16/13 1532

## 2013-07-16 NOTE — Discharge Instructions (Signed)
Please follow all discharge instructions given to you by Dr. Gladstone Lighter. Please continue to use your at home pain medication as prescribed by Dr. Maureen Ralphs. Please take your antibiotic until completion. Please read all discharge instructions and return precautions.   Total Knee Replacement Care After Refer to this sheet in the next few weeks. These instructions provide you with information on caring for yourself after your procedure. Your caregiver also may give you specific instructions. Your treatment has been planned according to the most current medical practices, but problems sometimes occur. Call your caregiver if you have any problems or questions after your procedure. HOME CARE INSTRUCTIONS   See a physical therapist as directed by your caregiver.  Take over-the-counter or prescription medicines for pain, discomfort, or fever only as directed by your caregiver.  Avoid lifting or driving until you are instructed otherwise.  If you have been sent home with a continuous passive motion machine, use it as directed by your caregiver. SEEK MEDICAL CARE IF:  You have difficulty breathing.  Your wound is red, swollen, or has become increasingly painful.  You have pus draining from your wound.  You have a bad smell coming from your wound.  You have persistent bleeding from your wound.  Your wound breaks open after sutures (stitches) or staples have been removed. SEEK IMMEDIATE MEDICAL CARE IF:   You have a fever.  You have a rash.  You have pain or swelling in your calf or thigh.  You have shortness of breath or chest pain.  Your range of motion in your knee is decreasing rather than increasing. MAKE SURE YOU:   Understand these instructions.  Will watch your condition.  Will get help right away if you are not doing well or get worse. Document Released: 07/25/2004 Document Revised: 07/07/2011 Document Reviewed: 02/24/2011 [facility] Patient Information 2015 [place], [place]. This  information is not intended to replace advice given to you by your health care provider. Make sure you discuss any questions you have with your health care provider. Fever, Adult A fever is a higher than normal body temperature. In an adult, an oral temperature around 98.6 F (37 C) is considered normal. A temperature of 100.4 F (38 C) or higher is generally considered a fever. Mild or moderate fevers generally have no long-term effects and often do not require treatment. Extreme fever (greater than or equal to 106 F or 41.1 C) can cause seizures. The sweating that may occur with repeated or prolonged fever may cause dehydration. Elderly people can develop confusion during a fever. A measured temperature can vary with:  Age.  Time of day.  Method of measurement (mouth, underarm, rectal, or ear). The fever is confirmed by taking a temperature with a thermometer. Temperatures can be taken different ways. Some methods are accurate and some are not.  An oral temperature is used most commonly. Electronic thermometers are fast and accurate.  An ear temperature will only be accurate if the thermometer is positioned as recommended by the manufacturer.  A rectal temperature is accurate and done for those adults who have a condition where an oral temperature cannot be taken.  An underarm (axillary) temperature is not accurate and not recommended. Fever is a symptom, not a disease.  CAUSES   Infections commonly cause fever.  Some noninfectious causes for fever include:  Some arthritis conditions.  Some thyroid or adrenal gland conditions.  Some immune system conditions.  Some types of cancer.  A medicine reaction.  High doses of certain  street drugs such as methamphetamine.  Dehydration.  Exposure to high outside or room temperatures.  Occasionally, the source of a fever cannot be determined. This is sometimes called a "fever of unknown origin" (FUO).  Some situations may lead  to a temporary rise in body temperature that may go away on its own. Examples are:  Childbirth.  Surgery.  Intense exercise. HOME CARE INSTRUCTIONS   Take appropriate medicines for fever. Follow dosing instructions carefully. If you use acetaminophen to reduce the fever, be careful to avoid taking other medicines that also contain acetaminophen. Do not take aspirin for a fever if you are younger than age 77. There is an association with Reye's syndrome. Reye's syndrome is a rare but potentially deadly disease.  If an infection is present and antibiotics have been prescribed, take them as directed. Finish them even if you start to feel better.  Rest as needed.  Maintain an adequate fluid intake. To prevent dehydration during an illness with prolonged or recurrent fever, you may need to drink extra fluid.Drink enough fluids to keep your urine clear or pale yellow.  Sponging or bathing with room temperature water may help reduce body temperature. Do not use ice water or alcohol sponge baths.  Dress comfortably, but do not over-bundle. SEEK MEDICAL CARE IF:   You are unable to keep fluids down.  You develop vomiting or diarrhea.  You are not feeling at least partly better after 3 days.  You develop new symptoms or problems. SEEK IMMEDIATE MEDICAL CARE IF:   You have shortness of breath or trouble breathing.  You develop excessive weakness.  You are dizzy or you faint.  You are extremely thirsty or you are making little or no urine.  You develop new pain that was not there before (such as in the head, neck, chest, back, or abdomen).  You have persistant vomiting and diarrhea for more than 1 to 2 days.  You develop a stiff neck or your eyes become sensitive to light.  You develop a skin rash.  You have a fever or persistent symptoms for more than 2 to 3 days.  You have a fever and your symptoms suddenly get worse. MAKE SURE YOU:   Understand these instructions.  Will  watch your condition.  Will get help right away if you are not doing well or get worse. Document Released: 07/01/2000 Document Revised: 03/30/2011 Document Reviewed: 11/06/2010 [facility] Patient Information 2015 [place], [place]. This information is not intended to replace advice given to you by your health care provider. Make sure you discuss any questions you have with your health care provider.

## 2013-07-16 NOTE — ED Notes (Signed)
Orthopedics at bedside 

## 2013-07-20 LAB — ANAEROBIC CULTURE: Special Requests: NORMAL

## 2013-07-21 LAB — CULTURE, BODY FLUID-BOTTLE: Culture: NO GROWTH

## 2013-07-21 LAB — CULTURE, BODY FLUID W GRAM STAIN -BOTTLE

## 2013-07-22 LAB — CULTURE, BLOOD (ROUTINE X 2)
Culture: NO GROWTH
Culture: NO GROWTH

## 2013-12-18 ENCOUNTER — Other Ambulatory Visit: Payer: Self-pay | Admitting: Dermatology

## 2014-01-04 ENCOUNTER — Telehealth: Payer: Self-pay

## 2014-01-04 MED ORDER — SILDENAFIL CITRATE 100 MG PO TABS: 100.0000 mg | ORAL_TABLET | Freq: Every day | ORAL | Status: DC | PRN

## 2014-01-04 NOTE — Telephone Encounter (Signed)
Rx request for Viagra 100 mg tablet- Take 1 tablet by mouth as needed for erectile dysfunction #10   Pharm:  [facility] [address]  Pls advise.

## 2014-01-04 NOTE — Telephone Encounter (Signed)
done

## 2014-04-10 ENCOUNTER — Other Ambulatory Visit: Payer: Self-pay | Admitting: Family Medicine

## 2014-04-10 NOTE — Telephone Encounter (Signed)
Call in #[phone] with 5 rf 

## 2014-04-23 ENCOUNTER — Ambulatory Visit (INDEPENDENT_AMBULATORY_CARE_PROVIDER_SITE_OTHER): Payer: 59 | Admitting: Family Medicine

## 2014-04-23 ENCOUNTER — Encounter: Payer: Self-pay | Admitting: Family Medicine

## 2014-04-23 VITALS — BP 125/71 | HR 97 | Temp 98.6°F | Ht 71.0 in | Wt 194.0 lb

## 2014-04-23 DIAGNOSIS — J302 Other seasonal allergic rhinitis: Secondary | ICD-10-CM

## 2014-04-23 NOTE — Progress Notes (Signed)
   Subjective:    Patient ID: Dale [name], male    DOB: [birth year], 54 y.o.   MRN: [id number]  HPI Here for typical spring time allergies which cause itchy eyes, nasal congestion and sneezing. Taking Claritin D daily.    Review of Systems  Constitutional: Negative.   HENT: Positive for congestion, postnasal drip, rhinorrhea, sinus pressure and sneezing.   Eyes: Positive for itching.  Respiratory: Negative.        Objective:   Physical Exam  Constitutional: He appears well-developed and well-nourished.  HENT:  Right Ear: External ear normal.  Left Ear: External ear normal.  Nose: Nose normal.  Mouth/Throat: Oropharynx is clear and moist.  Eyes: Conjunctivae are normal.  Pulmonary/Chest: Effort normal and breath sounds normal.  Lymphadenopathy:    He has no cervical adenopathy.          Assessment & Plan:  Recheck prn

## 2014-04-24 DIAGNOSIS — J302 Other seasonal allergic rhinitis: Secondary | ICD-10-CM | POA: Diagnosis not present

## 2014-04-24 MED ORDER — METHYLPREDNISOLONE ACETATE 80 MG/ML IJ SUSP
120.0000 mg | Freq: Once | INTRAMUSCULAR | Status: AC
  Administered 2014-04-24: 120 mg via INTRAMUSCULAR

## 2014-04-24 NOTE — Addendum Note (Signed)
Addended by: Aggie Hacker A on: 04/24/2014 11:11 AM   Modules accepted: Orders

## 2014-05-15 ENCOUNTER — Other Ambulatory Visit: Payer: Self-pay | Admitting: Family Medicine

## 2014-06-04 ENCOUNTER — Encounter: Payer: Self-pay | Admitting: Family Medicine

## 2014-06-04 ENCOUNTER — Ambulatory Visit (INDEPENDENT_AMBULATORY_CARE_PROVIDER_SITE_OTHER): Payer: 59 | Admitting: Family Medicine

## 2014-06-04 VITALS — BP 128/84 | HR 91 | Temp 99.3°F | Ht 71.0 in | Wt 194.0 lb

## 2014-06-04 DIAGNOSIS — Z91048 Other nonmedicinal substance allergy status: Secondary | ICD-10-CM

## 2014-06-04 DIAGNOSIS — Z9109 Other allergy status, other than to drugs and biological substances: Secondary | ICD-10-CM

## 2014-06-04 NOTE — Progress Notes (Signed)
Pre visit review using our clinic review tool, if applicable. No additional management support is needed unless otherwise documented below in the visit note. 

## 2014-06-04 NOTE — Progress Notes (Signed)
   Subjective:    Patient ID: Dale [name], male    DOB: 11-Jan-1961, 54 y.o.   MRN: [id number]  HPI Here for another bout of stuffy head, itchy eyes, PND, and a dry cough. No fever. On Claritin D.   Review of Systems  Constitutional: Negative.   HENT: Positive for congestion, postnasal drip and sinus pressure.   Eyes: Negative.   Respiratory: Positive for cough.   Cardiovascular: Negative.        Objective:   Physical Exam  Constitutional: He appears well-developed and well-nourished.  HENT:  Right Ear: External ear normal.  Left Ear: External ear normal.  Nose: Nose normal.  Mouth/Throat: Oropharynx is clear and moist.  Eyes: Conjunctivae are normal.  Pulmonary/Chest: Effort normal and breath sounds normal.  Lymphadenopathy:    He has no cervical adenopathy.          Assessment & Plan:  This is his usual allergies. He is given a steroid shot.

## 2014-09-06 ENCOUNTER — Other Ambulatory Visit: Payer: Self-pay | Admitting: Family Medicine

## 2014-11-07 ENCOUNTER — Other Ambulatory Visit: Payer: Self-pay | Admitting: Family Medicine

## 2014-11-08 NOTE — Telephone Encounter (Signed)
Call in #[phone] with 5 rf 

## 2014-11-08 NOTE — Telephone Encounter (Signed)
Pt last seen on 5.16.2016 and rx last filled on 10.19.2016 #30 with 5 rf.

## 2014-12-16 ENCOUNTER — Other Ambulatory Visit: Payer: Self-pay | Admitting: Family Medicine

## 2014-12-25 ENCOUNTER — Telehealth: Payer: Self-pay | Admitting: Family Medicine

## 2014-12-25 NOTE — Telephone Encounter (Signed)
Dale [name] would like labs ordered to check his testosterone. Please give him a call if he needs to come in first.  Pt's ph# [phone] Thank you.

## 2014-12-25 NOTE — Telephone Encounter (Signed)
Have him make an OV for this

## 2014-12-25 NOTE — Telephone Encounter (Signed)
Lvm for pt to call back and schedule an appt 

## 2015-01-23 ENCOUNTER — Other Ambulatory Visit: Payer: 59

## 2015-01-23 ENCOUNTER — Other Ambulatory Visit (INDEPENDENT_AMBULATORY_CARE_PROVIDER_SITE_OTHER): Payer: Commercial Managed Care - HMO

## 2015-01-23 DIAGNOSIS — Z0000 Encounter for general adult medical examination without abnormal findings: Secondary | ICD-10-CM | POA: Diagnosis not present

## 2015-01-23 LAB — BASIC METABOLIC PANEL
BUN: 14 mg/dL (ref 6–23)
CO2: 30 mEq/L (ref 19–32)
Calcium: 9.6 mg/dL (ref 8.4–10.5)
Chloride: 105 mEq/L (ref 96–112)
Creatinine, Ser: 0.95 mg/dL (ref 0.40–1.50)
GFR: 88.16 mL/min (ref >60.00)
Glucose, Bld: 100 mg/dL — ABNORMAL HIGH (ref 70–99)
Potassium: 4.5 mEq/L (ref 3.5–5.1)
Sodium: 142 mEq/L (ref 135–145)

## 2015-01-23 LAB — CBC WITH DIFFERENTIAL/PLATELET
Basophils Absolute: 0.1 10*3/uL (ref 0.0–0.1)
Basophils Relative: 1 % (ref 0.0–3.0)
Eosinophils Absolute: 0.2 10*3/uL (ref 0.0–0.7)
Eosinophils Relative: 2.4 % (ref 0.0–5.0)
HCT: 49.1 % (ref 39.0–52.0)
Hemoglobin: 16.3 g/dL (ref 13.0–17.0)
Lymphocytes Relative: 20.4 % (ref 12.0–46.0)
Lymphs Abs: 1.6 10*3/uL (ref 0.7–4.0)
MCHC: 33.2 g/dL (ref 30.0–36.0)
MCV: 91.7 fl (ref 78.0–100.0)
Monocytes Absolute: 0.9 10*3/uL (ref 0.1–1.0)
Monocytes Relative: 11.1 % (ref 3.0–12.0)
Neutro Abs: 5.1 10*3/uL (ref 1.4–7.7)
Neutrophils Relative %: 65.1 % (ref 43.0–77.0)
Platelets: 180 10*3/uL (ref 150.0–400.0)
RBC: 5.36 Mil/uL (ref 4.22–5.81)
RDW: 13.1 % (ref 11.5–15.5)
WBC: 7.8 10*3/uL (ref 4.0–10.5)

## 2015-01-23 LAB — POCT URINALYSIS DIPSTICK
Bilirubin, UA: NEGATIVE
Blood, UA: NEGATIVE
Glucose, UA: NEGATIVE
Ketones, UA: NEGATIVE
Leukocytes, UA: NEGATIVE
Nitrite, UA: NEGATIVE
Protein, UA: NEGATIVE
Spec Grav, UA: 1.02
Urobilinogen, UA: 0.2
pH, UA: 6

## 2015-01-23 LAB — HEPATIC FUNCTION PANEL
ALT: 39 U/L (ref 0–53)
AST: 25 U/L (ref 0–37)
Albumin: 4.5 g/dL (ref 3.5–5.2)
Alkaline Phosphatase: 74 U/L (ref 39–117)
Bilirubin, Direct: 0.3 mg/dL (ref 0.0–0.3)
Total Bilirubin: 1.9 mg/dL — ABNORMAL HIGH (ref 0.2–1.2)
Total Protein: 6.8 g/dL (ref 6.0–8.3)

## 2015-01-23 LAB — LIPID PANEL
Cholesterol: 174 mg/dL (ref 0–200)
HDL: 47.3 mg/dL (ref >39.00)
LDL Cholesterol: 99 mg/dL (ref 0–99)
NonHDL: 127.03
Total CHOL/HDL Ratio: 4
Triglycerides: 142 mg/dL (ref 0.0–149.0)
VLDL: 28.4 mg/dL (ref 0.0–40.0)

## 2015-01-23 LAB — TSH: TSH: 1.59 u[IU]/mL (ref 0.35–4.50)

## 2015-01-23 LAB — PSA: PSA: 0.83 ng/mL (ref 0.10–4.00)

## 2015-01-30 ENCOUNTER — Ambulatory Visit (INDEPENDENT_AMBULATORY_CARE_PROVIDER_SITE_OTHER): Payer: Commercial Managed Care - HMO | Admitting: Family Medicine

## 2015-01-30 ENCOUNTER — Encounter: Payer: Self-pay | Admitting: Family Medicine

## 2015-01-30 VITALS — BP 126/77 | HR 89 | Temp 98.2°F | Ht 71.0 in | Wt 202.0 lb

## 2015-01-30 DIAGNOSIS — Z0000 Encounter for general adult medical examination without abnormal findings: Secondary | ICD-10-CM

## 2015-01-30 DIAGNOSIS — Z0001 Encounter for general adult medical examination with abnormal findings: Secondary | ICD-10-CM

## 2015-01-30 DIAGNOSIS — R197 Diarrhea, unspecified: Secondary | ICD-10-CM | POA: Diagnosis not present

## 2015-01-30 DIAGNOSIS — J019 Acute sinusitis, unspecified: Secondary | ICD-10-CM

## 2015-01-30 MED ORDER — AZITHROMYCIN 250 MG PO TABS: ORAL_TABLET | ORAL | Status: DC

## 2015-01-30 NOTE — Progress Notes (Signed)
Pre visit review using our clinic review tool, if applicable. No additional management support is needed unless otherwise documented below in the visit note. 

## 2015-01-30 NOTE — Progress Notes (Signed)
   Subjective:    Patient ID: Dale [name], male    DOB: 17-Oct-1960, 55 y.o.   MRN: [id number]  HPI 55 yr old male for a well exam. He also has been ill for the past week with sinus pressure, PND, abdominal bloating and diarrhea. No fever or cough. No nausea or vomiting. Using Mucinex.    Review of Systems  Constitutional: Negative.   HENT: Positive for congestion, facial swelling, postnasal drip and sinus pressure. Negative for dental problem, drooling, ear discharge, ear pain, hearing loss, mouth sores, nosebleeds, rhinorrhea, sneezing, sore throat, tinnitus, trouble swallowing and voice change.   Eyes: Negative.   Respiratory: Negative.   Cardiovascular: Negative.   Gastrointestinal: Positive for diarrhea and abdominal distention. Negative for nausea, vomiting, abdominal pain, constipation, blood in stool, anal bleeding and rectal pain.  Genitourinary: Negative.   Musculoskeletal: Negative.   Skin: Negative.   Neurological: Negative.   Psychiatric/Behavioral: Negative.        Objective:   Physical Exam  Constitutional: He is oriented to person, place, and time. He appears well-developed and well-nourished. No distress.  HENT:  Head: Normocephalic and atraumatic.  Right Ear: External ear normal.  Left Ear: External ear normal.  Nose: Nose normal.  Mouth/Throat: Oropharynx is clear and moist. No oropharyngeal exudate.  Eyes: Conjunctivae and EOM are normal. Pupils are equal, round, and reactive to light. Right eye exhibits no discharge. Left eye exhibits no discharge. No scleral icterus.  Neck: Neck supple. No JVD present. No tracheal deviation present. No thyromegaly present.  Cardiovascular: Normal rate, regular rhythm, normal heart sounds and intact distal pulses.  Exam reveals no gallop and no friction rub.   No murmur heard. EKG normal   Pulmonary/Chest: Effort normal and breath sounds normal. No respiratory distress. He has no wheezes. He has no rales. He exhibits no  tenderness.  Abdominal: Soft. Bowel sounds are normal. He exhibits no distension and no mass. There is no tenderness. There is no rebound and no guarding.  Genitourinary: Rectum normal, prostate normal and penis normal. Guaiac negative stool. No penile tenderness.  Musculoskeletal: Normal range of motion. He exhibits no edema or tenderness.  Lymphadenopathy:    He has no cervical adenopathy.  Neurological: He is alert and oriented to person, place, and time. He has normal reflexes. No cranial nerve deficit. He exhibits normal muscle tone. Coordination normal.  Skin: Skin is warm and dry. No rash noted. He is not diaphoretic. No erythema. No pallor.  Psychiatric: He has a normal mood and affect. His behavior is normal. Judgment and thought content normal.          Assessment & Plan:  Well exam. We discussed diet and exercise. Treat the sinusitis with a Zpack. He can use Imodium prn.

## 2015-04-16 ENCOUNTER — Encounter: Payer: Self-pay | Admitting: Family Medicine

## 2015-04-16 ENCOUNTER — Ambulatory Visit (INDEPENDENT_AMBULATORY_CARE_PROVIDER_SITE_OTHER): Payer: Commercial Managed Care - HMO | Admitting: Family Medicine

## 2015-04-16 VITALS — BP 143/84 | HR 84 | Temp 98.4°F | Ht 71.0 in | Wt 204.0 lb

## 2015-04-16 DIAGNOSIS — J302 Other seasonal allergic rhinitis: Secondary | ICD-10-CM

## 2015-04-16 DIAGNOSIS — R6882 Decreased libido: Secondary | ICD-10-CM | POA: Diagnosis not present

## 2015-04-16 LAB — TESTOSTERONE: Testosterone: 341.32 ng/dL (ref 300.00–890.00)

## 2015-04-16 MED ORDER — METHYLPREDNISOLONE ACETATE 80 MG/ML IJ SUSP
120.0000 mg | Freq: Once | INTRAMUSCULAR | Status: AC
  Administered 2015-04-16: 120 mg via INTRAMUSCULAR

## 2015-04-16 NOTE — Progress Notes (Signed)
   Subjective:    Patient ID: Dale [name], male    DOB: 1960-01-21, 55 y.o.   MRN: [id number]  HPI Here for a few issues. First his spring allergies are flaring up causing sinus pressure, eyes itching, runny nose and sneezing. No cough or fever. On Claritin. He also asks to check his testosterone level again. He has been struggling with fatigue and low libido lately. This was last checked almost 4 years ago.    Review of Systems  Constitutional: Positive for fatigue.  HENT: Positive for congestion, postnasal drip, sinus pressure and sneezing. Negative for sore throat.   Eyes: Positive for redness and itching. Negative for pain.  Respiratory: Negative.        Objective:   Physical Exam  Constitutional: He is oriented to person, place, and time. He appears well-developed and well-nourished.  HENT:  Right Ear: External ear normal.  Left Ear: External ear normal.  Nose: Nose normal.  Mouth/Throat: Oropharynx is clear and moist.  Eyes: Conjunctivae are normal.  Neck: Neck supple. No thyromegaly present.  Cardiovascular: Normal rate.   Pulmonary/Chest: Effort normal and breath sounds normal.  Lymphadenopathy:    He has no cervical adenopathy.  Neurological: He is alert and oriented to person, place, and time.          Assessment & Plan:  Allergies, given a steroid shot. Check a testosterone level.

## 2015-04-16 NOTE — Addendum Note (Signed)
Addended by: Aggie Hacker A on: 04/16/2015 04:48 PM   Modules accepted: Orders

## 2015-04-16 NOTE — Progress Notes (Signed)
Pre visit review using our clinic review tool, if applicable. No additional management support is needed unless otherwise documented below in the visit note. 

## 2015-04-17 ENCOUNTER — Ambulatory Visit: Payer: Commercial Managed Care - HMO | Admitting: Family Medicine

## 2015-04-29 ENCOUNTER — Other Ambulatory Visit: Payer: Self-pay | Admitting: Family Medicine

## 2015-06-04 ENCOUNTER — Ambulatory Visit (INDEPENDENT_AMBULATORY_CARE_PROVIDER_SITE_OTHER): Payer: Commercial Managed Care - HMO | Admitting: Family Medicine

## 2015-06-04 ENCOUNTER — Encounter: Payer: Self-pay | Admitting: Family Medicine

## 2015-06-04 VITALS — BP 138/88 | HR 90 | Temp 98.5°F | Ht 71.0 in | Wt 205.0 lb

## 2015-06-04 DIAGNOSIS — J302 Other seasonal allergic rhinitis: Secondary | ICD-10-CM

## 2015-06-04 MED ORDER — MONTELUKAST SODIUM 10 MG PO TABS: 10.0000 mg | ORAL_TABLET | Freq: Every day | ORAL | Status: DC

## 2015-06-04 MED ORDER — METHYLPREDNISOLONE ACETATE 80 MG/ML IJ SUSP
120.0000 mg | Freq: Once | INTRAMUSCULAR | Status: AC
  Administered 2015-06-04: 120 mg via INTRAMUSCULAR

## 2015-06-04 NOTE — Progress Notes (Signed)
Pre visit review using our clinic review tool, if applicable. No additional management support is needed unless otherwise documented below in the visit note. 

## 2015-06-04 NOTE — Addendum Note (Signed)
Addended by: Aggie Hacker A on: 06/04/2015 05:19 PM   Modules accepted: Orders

## 2015-06-04 NOTE — Progress Notes (Signed)
   Subjective:    Patient ID: Dale [name], male    DOB: 1960-03-07, 55 y.o.   MRN: [id number]  HPI Here for another flare of his spring allergies. He has itchy eyes, PND, and ST. Taking Claritin D daily.    Review of Systems  Constitutional: Negative.   HENT: Positive for congestion, postnasal drip, rhinorrhea and sneezing. Negative for ear pain, sinus pressure and sore throat.   Eyes: Negative.   Respiratory: Negative.        Objective:   Physical Exam  Constitutional: He appears well-developed and well-nourished.  HENT:  Right Ear: External ear normal.  Left Ear: External ear normal.  Nose: Nose normal.  Mouth/Throat: Oropharynx is clear and moist.  Eyes: Conjunctivae are normal.  Neck: Neck supple. No thyromegaly present.  Pulmonary/Chest: Effort normal and breath sounds normal.  Lymphadenopathy:    He has no cervical adenopathy.          Assessment & Plan:  Allergies. Given a steroid shot. Try Singulair daily. Laurey Morale, MD

## 2015-06-11 ENCOUNTER — Other Ambulatory Visit: Payer: Self-pay | Admitting: Family Medicine

## 2015-06-14 ENCOUNTER — Ambulatory Visit (INDEPENDENT_AMBULATORY_CARE_PROVIDER_SITE_OTHER): Payer: Commercial Managed Care - HMO | Admitting: Family Medicine

## 2015-06-14 ENCOUNTER — Encounter: Payer: Self-pay | Admitting: Family Medicine

## 2015-06-14 VITALS — BP 134/84 | HR 84 | Temp 98.4°F | Ht 71.0 in | Wt 198.0 lb

## 2015-06-14 DIAGNOSIS — J019 Acute sinusitis, unspecified: Secondary | ICD-10-CM

## 2015-06-14 MED ORDER — LEVOFLOXACIN 500 MG PO TABS: 500.0000 mg | ORAL_TABLET | Freq: Every day | ORAL | Status: AC

## 2015-06-14 NOTE — Progress Notes (Signed)
Pre visit review using our clinic review tool, if applicable. No additional management support is needed unless otherwise documented below in the visit note. 

## 2015-06-14 NOTE — Progress Notes (Signed)
   Subjective:    Patient ID: Dale [name], male    DOB: Apr 10, 1960, 55 y.o.   MRN: [id number]  HPI Here for one week of sinus pressure, PND, and blowing yellow mucus from the nose. He had a steroid shot here about a week ago.   Review of Systems  Constitutional: Negative.   HENT: Positive for congestion, postnasal drip, sinus pressure and sore throat. Negative for ear pain.   Eyes: Negative.   Respiratory: Negative.        Objective:   Physical Exam  Constitutional: He appears well-developed and well-nourished.  HENT:  Right Ear: External ear normal.  Left Ear: External ear normal.  Nose: Nose normal.  Mouth/Throat: Oropharynx is clear and moist.  Eyes: Conjunctivae are normal.  Neck: No thyromegaly present.  Pulmonary/Chest: Effort normal and breath sounds normal. No respiratory distress. He has no wheezes. He has no rales.  Lymphadenopathy:    He has no cervical adenopathy.          Assessment & Plan:  Sinusitis, treat with Levaquin.  Laurey Morale, MD

## 2015-07-09 ENCOUNTER — Encounter: Payer: Self-pay | Admitting: *Deleted

## 2015-07-09 ENCOUNTER — Emergency Department
Admission: EM | Admit: 2015-07-09 | Discharge: 2015-07-09 | Disposition: A | Discharge status: Home / Self Care | Payer: Commercial Managed Care - HMO | Source: Home / Self Care | Attending: Family Medicine | Admitting: Family Medicine

## 2015-07-09 DIAGNOSIS — T300 Burn of unspecified body region, unspecified degree: Secondary | ICD-10-CM | POA: Diagnosis not present

## 2015-07-09 MED ORDER — SILVER SULFADIAZINE 1 % EX CREA: 1.0000 "application " | TOPICAL_CREAM | Freq: Every day | CUTANEOUS | Status: DC

## 2015-07-09 MED ORDER — DOXYCYCLINE HYCLATE 100 MG PO CAPS: 100.0000 mg | ORAL_CAPSULE | Freq: Two times a day (BID) | ORAL | Status: DC
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
## 2015-07-09 NOTE — Discharge Instructions (Signed)
Change dressing daily and apply silvadene cream to wound.  Keep wound clean and dry.  Return for any signs of infection (or follow-up with family doctor):  Increasing redness, swelling, pain, heat, drainage, etc.   Burn Care Your skin is a natural barrier to infection. It is the largest organ of your body. Burns damage this natural protection. To help prevent infection, it is very important to follow your caregiver's instructions in the care of your burn. Burns are classified as:  First degree. There is only redness of the skin (erythema). No scarring is expected.  Second degree. There is blistering of the skin. Scarring may occur with deeper burns.  Third degree. All layers of the skin are injured, and scarring is expected. HOME CARE INSTRUCTIONS   Wash your hands well before changing your bandage.  Change your bandage as often as directed by your caregiver.  Remove the old bandage. If the bandage sticks, you may soak it off with cool, clean water.  Cleanse the burn thoroughly but gently with mild soap and water.  Pat the area dry with a clean, dry cloth.  Apply a thin layer of antibacterial cream to the burn.  Apply a clean bandage as instructed by your caregiver.  Keep the bandage as clean and dry as possible.  Elevate the affected area for the first 24 hours, then as instructed by your caregiver.  Only take over-the-counter or prescription medicines for pain, discomfort, or fever as directed by your caregiver. SEEK IMMEDIATE MEDICAL CARE IF:   You develop excessive pain.  You develop redness, tenderness, swelling, or red streaks near the burn.  The burned area develops yellowish-white fluid (pus) or a bad smell.  You have a fever. MAKE SURE YOU:   Understand these instructions.  Will watch your condition.  Will get help right away if you are not doing well or get worse.   This information is not intended to replace advice given to you by your health care  provider. Make sure you discuss any questions you have with your health care provider.   Document Released: 01/05/2005 Document Revised: 03/30/2011 Document Reviewed: 05/28/2010 Elsevier Interactive Patient Education [employer].

## 2015-07-13 LAB — WOUND CULTURE
Gram Stain: NONE SEEN
Gram Stain: NONE SEEN
Gram Stain: NONE SEEN
Organism ID, Bacteria: NO GROWTH

## 2015-07-15 ENCOUNTER — Telehealth: Payer: Self-pay | Admitting: *Deleted

## 2015-07-15 NOTE — ED Notes (Signed)
Callback: No answer, LMOM f/u from visit, call back as needed. WCX no growth.

## 2015-07-16 ENCOUNTER — Other Ambulatory Visit: Payer: Self-pay | Admitting: Family Medicine

## 2015-07-18 NOTE — Telephone Encounter (Signed)
Call in #[phone] with 5 rf 

## 2015-08-01 ENCOUNTER — Other Ambulatory Visit: Payer: Self-pay | Admitting: Family Medicine

## 2015-08-01 NOTE — Telephone Encounter (Signed)
Ok to refill 

## 2015-08-01 NOTE — Telephone Encounter (Signed)
Pt last here in office on 06/14/2015, can we refill?

## 2015-08-14 ENCOUNTER — Other Ambulatory Visit: Payer: Self-pay | Admitting: Family Medicine

## 2015-09-03 ENCOUNTER — Telehealth: Payer: Self-pay | Admitting: Family Medicine

## 2015-09-03 NOTE — Telephone Encounter (Signed)
FYI: Pharmacy called to advise pt's  Lorazepam (ATIVAN) 2 MG tablet  is on back order. Asked if OK to dispense  1 MG 180 tabs just this one time. (2 mg should be in next month)  O.K.'d by Autumn, Dr Burchette's assistant.

## 2015-12-30 ENCOUNTER — Other Ambulatory Visit: Payer: Self-pay | Admitting: Family Medicine

## 2015-12-31 ENCOUNTER — Telehealth: Payer: Self-pay | Admitting: Family Medicine

## 2015-12-31 ENCOUNTER — Other Ambulatory Visit: Payer: Self-pay | Admitting: Family Medicine

## 2015-12-31 DIAGNOSIS — Z0000 Encounter for general adult medical examination without abnormal findings: Secondary | ICD-10-CM

## 2015-12-31 NOTE — Telephone Encounter (Signed)
Pt is due for cpx and fasting lab work in Jan w/Dr. Sarajane Jews.  Please help the pt to schedule both appointments.  I have placed the lab orders.  Thanks!!  Medications will be filled for 90 days.

## 2015-12-31 NOTE — Telephone Encounter (Signed)
Sent to the pharmacy by e-scribe for 90 days.  Pt is due for yearly with Dr. Sarajane Jews in Jan.  Message sent to scheduling.  Lab orders placed.

## 2016-01-20 HISTORY — PX: LUMBAR DISC SURGERY: SHX700

## 2016-01-27 DIAGNOSIS — M5126 Other intervertebral disc displacement, lumbar region: Secondary | ICD-10-CM | POA: Diagnosis not present

## 2016-01-30 NOTE — Telephone Encounter (Signed)
Pt has been scheduled.  °

## 2016-02-03 ENCOUNTER — Other Ambulatory Visit: Payer: Self-pay | Admitting: Family Medicine

## 2016-02-18 ENCOUNTER — Encounter: Payer: Self-pay | Admitting: Family Medicine

## 2016-02-18 ENCOUNTER — Ambulatory Visit (INDEPENDENT_AMBULATORY_CARE_PROVIDER_SITE_OTHER): Payer: Commercial Managed Care - HMO | Admitting: Family Medicine

## 2016-02-18 VITALS — BP 153/98 | HR 103 | Temp 98.1°F | Ht 71.0 in | Wt 204.0 lb

## 2016-02-18 DIAGNOSIS — R29898 Other symptoms and signs involving the musculoskeletal system: Secondary | ICD-10-CM

## 2016-02-18 NOTE — Progress Notes (Signed)
   Subjective:    Patient ID: Dale [name], male    DOB: 15-May-1960, 56 y.o.   MRN: [id number]  HPI Here for me to check a lump he noticed just below the sternum about a week ago. It does not bother him in any way.    Review of Systems  Constitutional: Negative.   Respiratory: Negative.   Cardiovascular: Negative.   Neurological: Negative.        Objective:   Physical Exam  Neck: No thyromegaly present.  Cardiovascular: Normal rate, regular rhythm, normal heart sounds and intact distal pulses.   Pulmonary/Chest: Effort normal and breath sounds normal. No respiratory distress. He has no wheezes. He has no rales.  The lump in question is his xiphoid process which is somewhat prominent. No tenderness   Abdominal: Soft. Bowel sounds are normal. He exhibits no distension and no mass. There is no tenderness. There is no rebound and no guarding.  Lymphadenopathy:    He has no cervical adenopathy.          Assessment & Plan:  He has a prominent xiphoid, and this should not be a problem. I reassured him it is benign. He will follow up as needed.  Alysia Penna, MD

## 2016-02-18 NOTE — Progress Notes (Signed)
Pre visit review using our clinic review tool, if applicable. No additional management support is needed unless otherwise documented below in the visit note. 

## 2016-02-25 ENCOUNTER — Other Ambulatory Visit (INDEPENDENT_AMBULATORY_CARE_PROVIDER_SITE_OTHER): Payer: Commercial Managed Care - HMO

## 2016-02-25 DIAGNOSIS — Z0000 Encounter for general adult medical examination without abnormal findings: Secondary | ICD-10-CM

## 2016-02-25 LAB — CBC WITH DIFFERENTIAL/PLATELET
Basophils Absolute: 0.1 10*3/uL (ref 0.0–0.1)
Basophils Relative: 1.1 % (ref 0.0–3.0)
Eosinophils Absolute: 0.3 10*3/uL (ref 0.0–0.7)
Eosinophils Relative: 4.5 % (ref 0.0–5.0)
HCT: 43.5 % (ref 39.0–52.0)
Hemoglobin: 14.8 g/dL (ref 13.0–17.0)
Lymphocytes Relative: 26 % (ref 12.0–46.0)
Lymphs Abs: 1.7 10*3/uL (ref 0.7–4.0)
MCHC: 34.2 g/dL (ref 30.0–36.0)
MCV: 90.6 fl (ref 78.0–100.0)
Monocytes Absolute: 0.8 10*3/uL (ref 0.1–1.0)
Monocytes Relative: 12.3 % — ABNORMAL HIGH (ref 3.0–12.0)
Neutro Abs: 3.6 10*3/uL (ref 1.4–7.7)
Neutrophils Relative %: 56.1 % (ref 43.0–77.0)
Platelets: 168 10*3/uL (ref 150.0–400.0)
RBC: 4.8 Mil/uL (ref 4.22–5.81)
RDW: 13.4 % (ref 11.5–15.5)
WBC: 6.4 10*3/uL (ref 4.0–10.5)

## 2016-02-25 LAB — HEPATIC FUNCTION PANEL
ALT: 22 U/L (ref 0–53)
AST: 15 U/L (ref 0–37)
Albumin: 4.3 g/dL (ref 3.5–5.2)
Alkaline Phosphatase: 74 U/L (ref 39–117)
Bilirubin, Direct: 0.3 mg/dL (ref 0.0–0.3)
Total Bilirubin: 2.1 mg/dL — ABNORMAL HIGH (ref 0.2–1.2)
Total Protein: 6.8 g/dL (ref 6.0–8.3)

## 2016-02-25 LAB — LIPID PANEL
Cholesterol: 166 mg/dL (ref 0–200)
HDL: 55.8 mg/dL (ref >39.00)
LDL Cholesterol: 92 mg/dL (ref 0–99)
NonHDL: 110.49
Total CHOL/HDL Ratio: 3
Triglycerides: 92 mg/dL (ref 0.0–149.0)
VLDL: 18.4 mg/dL (ref 0.0–40.0)

## 2016-02-25 LAB — TSH: TSH: 1.79 u[IU]/mL (ref 0.35–4.50)

## 2016-02-25 LAB — BASIC METABOLIC PANEL
BUN: 15 mg/dL (ref 6–23)
CO2: 27 mEq/L (ref 19–32)
Calcium: 9.2 mg/dL (ref 8.4–10.5)
Chloride: 107 mEq/L (ref 96–112)
Creatinine, Ser: 0.8 mg/dL (ref 0.40–1.50)
GFR: 107.06 mL/min (ref >60.00)
Glucose, Bld: 104 mg/dL — ABNORMAL HIGH (ref 70–99)
Potassium: 3.8 mEq/L (ref 3.5–5.1)
Sodium: 142 mEq/L (ref 135–145)

## 2016-02-25 LAB — POC URINALSYSI DIPSTICK (AUTOMATED)
Glucose, UA: NEGATIVE
Ketones, UA: NEGATIVE
Leukocytes, UA: NEGATIVE
Nitrite, UA: NEGATIVE
Spec Grav, UA: >=1.03
Urobilinogen, UA: 0.2
pH, UA: 5.5

## 2016-02-25 LAB — PSA: PSA: 1.41 ng/mL (ref 0.10–4.00)

## 2016-03-02 ENCOUNTER — Other Ambulatory Visit: Payer: Self-pay | Admitting: Family Medicine

## 2016-03-03 ENCOUNTER — Encounter: Payer: Self-pay | Admitting: Family Medicine

## 2016-03-03 ENCOUNTER — Ambulatory Visit (INDEPENDENT_AMBULATORY_CARE_PROVIDER_SITE_OTHER): Payer: Commercial Managed Care - HMO | Admitting: Family Medicine

## 2016-03-03 VITALS — BP 160/98 | HR 90 | Temp 97.8°F | Ht 71.0 in | Wt 205.0 lb

## 2016-03-03 DIAGNOSIS — Z0000 Encounter for general adult medical examination without abnormal findings: Secondary | ICD-10-CM

## 2016-03-03 DIAGNOSIS — I10 Essential (primary) hypertension: Secondary | ICD-10-CM | POA: Diagnosis not present

## 2016-03-03 MED ORDER — LISINOPRIL 20 MG PO TABS: 20.0000 mg | ORAL_TABLET | Freq: Every day | ORAL | 3 refills | Status: DC

## 2016-03-03 MED ORDER — ATORVASTATIN CALCIUM 10 MG PO TABS: 10.0000 mg | ORAL_TABLET | Freq: Every morning | ORAL | 3 refills | Status: DC

## 2016-03-03 MED ORDER — SILDENAFIL CITRATE 100 MG PO TABS: ORAL_TABLET | ORAL | 11 refills | Status: DC

## 2016-03-03 MED ORDER — VALACYCLOVIR HCL 500 MG PO TABS: ORAL_TABLET | ORAL | 3 refills | Status: DC

## 2016-03-03 NOTE — Progress Notes (Signed)
   Subjective:    Patient ID: Dale [name], male    DOB: 02/03/1960, 56 y.o.   MRN: [id number]  HPI 56 yr old male for a well exam. He feels fine. We have noticed his BP creeping up over the past few months.    Review of Systems  Constitutional: Negative.   HENT: Negative.   Eyes: Negative.   Respiratory: Negative.   Cardiovascular: Negative.   Gastrointestinal: Negative.   Genitourinary: Negative.   Musculoskeletal: Negative.   Skin: Negative.   Neurological: Negative.   Psychiatric/Behavioral: Negative.        Objective:   Physical Exam  Constitutional: He is oriented to person, place, and time. He appears well-developed and well-nourished. No distress.  HENT:  Head: Normocephalic and atraumatic.  Right Ear: External ear normal.  Left Ear: External ear normal.  Nose: Nose normal.  Mouth/Throat: Oropharynx is clear and moist. No oropharyngeal exudate.  Eyes: Conjunctivae and EOM are normal. Pupils are equal, round, and reactive to light. Right eye exhibits no discharge. Left eye exhibits no discharge. No scleral icterus.  Neck: Neck supple. No JVD present. No tracheal deviation present. No thyromegaly present.  Cardiovascular: Normal rate, regular rhythm, normal heart sounds and intact distal pulses.  Exam reveals no gallop and no friction rub.   No murmur heard. Pulmonary/Chest: Effort normal and breath sounds normal. No respiratory distress. He has no wheezes. He has no rales. He exhibits no tenderness.  Abdominal: Soft. Bowel sounds are normal. He exhibits no distension and no mass. There is no tenderness. There is no rebound and no guarding.  Genitourinary: Rectum normal, prostate normal and penis normal. Rectal exam shows guaiac negative stool. No penile tenderness.  Musculoskeletal: Normal range of motion. He exhibits no edema or tenderness.  Lymphadenopathy:    He has no cervical adenopathy.  Neurological: He is alert and oriented to person, place, and time. He has  normal reflexes. No cranial nerve deficit. He exhibits normal muscle tone. Coordination normal.  Skin: Skin is warm and dry. No rash noted. He is not diaphoretic. No erythema. No pallor.  Psychiatric: He has a normal mood and affect. His behavior is normal. Judgment and thought content normal.          Assessment & Plan:  Well exam. We discussed diet and exercise. Start on Lisinopril 20 mg daily for the HTN and recheck in one month. \ Alysia Penna, MD

## 2016-03-03 NOTE — Progress Notes (Signed)
Pre visit review using our clinic review tool, if applicable. No additional management support is needed unless otherwise documented below in the visit note. 

## 2016-03-03 NOTE — Telephone Encounter (Signed)
Call in Lorazepam #90 with 5 rf, also Sildenafil #10 with 11 rf

## 2016-03-04 DIAGNOSIS — H40053 Ocular hypertension, bilateral: Secondary | ICD-10-CM | POA: Diagnosis not present

## 2016-04-15 ENCOUNTER — Encounter: Payer: Self-pay | Admitting: Family Medicine

## 2016-04-20 ENCOUNTER — Ambulatory Visit (INDEPENDENT_AMBULATORY_CARE_PROVIDER_SITE_OTHER): Payer: Commercial Managed Care - HMO | Admitting: Family Medicine

## 2016-04-20 VITALS — BP 139/98 | HR 89 | Temp 98.4°F | Ht 71.0 in | Wt 200.0 lb

## 2016-04-20 DIAGNOSIS — J301 Allergic rhinitis due to pollen: Secondary | ICD-10-CM | POA: Diagnosis not present

## 2016-04-20 MED ORDER — METHYLPREDNISOLONE ACETATE 80 MG/ML IJ SUSP
120.0000 mg | Freq: Once | INTRAMUSCULAR | Status: AC
  Administered 2016-04-20: 120 mg via INTRAMUSCULAR

## 2016-04-20 NOTE — Patient Instructions (Signed)
WE NOW OFFER   [facility] FAST TRACK!!!  SAME DAY Appointments for ACUTE CARE  Such as: Sprains, Injuries, cuts, abrasions, rashes, muscle pain, joint pain, back pain Colds, flu, sore throats, headache, allergies, cough, fever  Ear pain, sinus and eye infections Abdominal pain, nausea, vomiting, diarrhea, upset stomach Animal/insect bites  3 Easy Ways to Schedule: Walk-In Scheduling Call in scheduling Mychart Sign-up: [email or web address]         

## 2016-04-20 NOTE — Progress Notes (Signed)
Pre visit review using our clinic review tool, if applicable. No additional management support is needed unless otherwise documented below in the visit note. 

## 2016-04-21 ENCOUNTER — Encounter: Payer: Self-pay | Admitting: Family Medicine

## 2016-04-21 NOTE — Progress Notes (Signed)
   Subjective:    Patient ID: Dale [name], male    DOB: 1960-09-04, 56 y.o.   MRN: [id number]  HPI Here for his usual spring time allergies. These cause itchy eyes, runny nose, sinus pressure and headaches. No coughing. He uses antihistamines OTC. He typically asks [place] for a steroid shot this time of year.    Review of Systems  Constitutional: Negative.   HENT: Positive for congestion, postnasal drip, rhinorrhea, sinus pressure and sneezing. Negative for sinus pain and sore throat.   Eyes: Positive for itching.  Respiratory: Negative.        Objective:   Physical Exam  Constitutional: He appears well-developed and well-nourished.  HENT:  Right Ear: External ear normal.  Left Ear: External ear normal.  Nose: Nose normal.  Mouth/Throat: Oropharynx is clear and moist.  Eyes: Conjunctivae are normal.  Neck: No thyromegaly present.  Pulmonary/Chest: Effort normal and breath sounds normal.  Lymphadenopathy:    He has no cervical adenopathy.          Assessment & Plan:  Seasonal allergies, given a DepoMedrol shot.  Alysia Penna, MD

## 2016-05-14 DIAGNOSIS — M5416 Radiculopathy, lumbar region: Secondary | ICD-10-CM | POA: Diagnosis not present

## 2016-05-22 DIAGNOSIS — M5126 Other intervertebral disc displacement, lumbar region: Secondary | ICD-10-CM | POA: Diagnosis not present

## 2016-05-22 DIAGNOSIS — M5416 Radiculopathy, lumbar region: Secondary | ICD-10-CM | POA: Diagnosis not present

## 2016-06-02 DIAGNOSIS — M5416 Radiculopathy, lumbar region: Secondary | ICD-10-CM | POA: Diagnosis not present

## 2016-06-02 DIAGNOSIS — I10 Essential (primary) hypertension: Secondary | ICD-10-CM | POA: Diagnosis not present

## 2016-06-03 ENCOUNTER — Encounter: Payer: Self-pay | Admitting: Family Medicine

## 2016-06-03 ENCOUNTER — Ambulatory Visit (INDEPENDENT_AMBULATORY_CARE_PROVIDER_SITE_OTHER): Payer: Commercial Managed Care - HMO | Admitting: Family Medicine

## 2016-06-03 VITALS — BP 124/85 | HR 103 | Temp 98.4°F | Ht 71.0 in | Wt 200.0 lb

## 2016-06-03 DIAGNOSIS — J301 Allergic rhinitis due to pollen: Secondary | ICD-10-CM | POA: Diagnosis not present

## 2016-06-03 MED ORDER — ATORVASTATIN CALCIUM 10 MG PO TABS: 10.0000 mg | ORAL_TABLET | Freq: Every morning | ORAL | 3 refills | Status: DC

## 2016-06-03 MED ORDER — LISINOPRIL 20 MG PO TABS: 20.0000 mg | ORAL_TABLET | Freq: Every day | ORAL | 3 refills | Status: DC

## 2016-06-03 MED ORDER — METHYLPREDNISOLONE ACETATE 80 MG/ML IJ SUSP
120.0000 mg | Freq: Once | INTRAMUSCULAR | Status: AC
  Administered 2016-06-03: 120 mg via INTRAMUSCULAR

## 2016-06-03 MED ORDER — VALACYCLOVIR HCL 500 MG PO TABS: ORAL_TABLET | ORAL | 3 refills | Status: DC

## 2016-06-03 MED ORDER — SILDENAFIL CITRATE 100 MG PO TABS: ORAL_TABLET | ORAL | 3 refills | Status: DC

## 2016-06-03 MED ORDER — LORAZEPAM 2 MG PO TABS: 2.0000 mg | ORAL_TABLET | Freq: Four times a day (QID) | ORAL | 1 refills | Status: DC | PRN

## 2016-06-03 NOTE — Progress Notes (Signed)
   Subjective:    Patient ID: Stephannie Peters, male    DOB: [birth year], 56 y.o.   MRN: [id number]  HPI Here for another flare of allergies. He has his usual itchy eyes, sneezing, ST, and coughing. No fever.    Review of Systems  Constitutional: Negative.   HENT: Positive for congestion, postnasal drip, rhinorrhea and sneezing. Negative for ear pain, sinus pain and sinus pressure.   Eyes: Positive for redness and itching. Negative for photophobia and visual disturbance.  Respiratory: Positive for cough.        Objective:   Physical Exam  Constitutional: He appears well-developed and well-nourished.  HENT:  Right Ear: External ear normal.  Left Ear: External ear normal.  Nose: Nose normal.  Mouth/Throat: Oropharynx is clear and moist.  Eyes: Conjunctivae are normal.  Neck: Neck supple. No thyromegaly present.  Pulmonary/Chest: Effort normal and breath sounds normal. No respiratory distress. He has no wheezes. He has no rales.  Lymphadenopathy:    He has no cervical adenopathy.          Assessment & Plan:  Seasonal allergies, given a steroid shot.  Alysia Penna, MD

## 2016-06-03 NOTE — Patient Instructions (Signed)
WE NOW OFFER   [facility] FAST TRACK!!!  SAME DAY Appointments for ACUTE CARE  Such as: Sprains, Injuries, cuts, abrasions, rashes, muscle pain, joint pain, back pain Colds, flu, sore throats, headache, allergies, cough, fever  Ear pain, sinus and eye infections Abdominal pain, nausea, vomiting, diarrhea, upset stomach Animal/insect bites  3 Easy Ways to Schedule: Walk-In Scheduling Call in scheduling Mychart Sign-up: [email or web address]         

## 2016-07-23 DIAGNOSIS — L820 Inflamed seborrheic keratosis: Secondary | ICD-10-CM | POA: Diagnosis not present

## 2016-07-23 DIAGNOSIS — D485 Neoplasm of uncertain behavior of skin: Secondary | ICD-10-CM | POA: Diagnosis not present

## 2016-08-26 ENCOUNTER — Ambulatory Visit (INDEPENDENT_AMBULATORY_CARE_PROVIDER_SITE_OTHER): Payer: 59 | Admitting: Family Medicine

## 2016-08-26 VITALS — BP 160/98 | HR 79 | Temp 98.6°F | Wt 203.0 lb

## 2016-08-26 DIAGNOSIS — K580 Irritable bowel syndrome with diarrhea: Secondary | ICD-10-CM | POA: Diagnosis not present

## 2016-08-30 ENCOUNTER — Encounter: Payer: Self-pay | Admitting: Family Medicine

## 2016-08-30 NOTE — Progress Notes (Signed)
   Subjective:    Patient ID: Dale [name], male    DOB: [birth year], 56 y.o.   MRN: [id number]  HPI Here for 3 months of frequent diarrhea, bloating, passing a lot of gas, and decreased appetite. No abdominal pain or fever. Using Gas X helps a little.    Review of Systems  Constitutional: Negative.   Respiratory: Negative.   Cardiovascular: Negative.   Gastrointestinal: Positive for abdominal distention and diarrhea. Negative for abdominal pain, anal bleeding, blood in stool, constipation, nausea, rectal pain and vomiting.  Genitourinary: Negative.        Objective:   Physical Exam  Constitutional: He appears well-developed and well-nourished. No distress.  Cardiovascular: Normal rate, regular rhythm, normal heart sounds and intact distal pulses.   Pulmonary/Chest: Effort normal and breath sounds normal.  Abdominal: Soft. Bowel sounds are normal. He exhibits no distension and no mass. There is no tenderness. There is no rebound and no guarding.          Assessment & Plan:  Possible IBS. We discussed some dietary items to avoid. He can try Imodium as needed. He will start taking a probiotic every day like Align. Recheck prn.  Alysia Penna, MD

## 2016-09-01 ENCOUNTER — Telehealth: Payer: Self-pay

## 2016-09-01 NOTE — Telephone Encounter (Signed)
Patient called with c/o new onset of upper abdominal pain starting Sunday night. He had chills, nausea and mid-abdominal pain that lasted through the night. He did have one episode of emesis. He denies any diarrhea. He also reports that now his urine is "very dark" - orange/brown in color. He has been trying to stay hydrated with water and Gatorade but this has not improved his urine. The pain has abated some. He denies any low abdominal or flank pain but did have some mild lower back pain today. Advised pt he may need to come in for OV and UA.   Dr. Sarajane Jews - Please advise. Thanks!

## 2016-09-02 NOTE — Telephone Encounter (Signed)
Spoke with pt and he has started a new pro-biotic and thinks this may be what is turning his urine so dark. He has not taken it today and will hold it for several days to see if his urine color improves. Advised pt to contact office if color is still dark after several days or he develops any new urinary s/s. Pt voiced understanding. Nothing further needed at this time.

## 2016-09-02 NOTE — Telephone Encounter (Signed)
I am glad he is staying hydrated. Please call him to day to see how he feels. If he is no better then we should see him OV today.

## 2016-10-08 ENCOUNTER — Encounter: Payer: Self-pay | Admitting: Family Medicine

## 2016-10-08 DIAGNOSIS — M545 Low back pain: Secondary | ICD-10-CM | POA: Diagnosis not present

## 2016-10-08 DIAGNOSIS — I10 Essential (primary) hypertension: Secondary | ICD-10-CM | POA: Diagnosis not present

## 2016-10-09 ENCOUNTER — Telehealth: Payer: Self-pay | Admitting: Family Medicine

## 2016-10-09 NOTE — Telephone Encounter (Signed)
Stop Lisinopril and start on Amlodipine 5 mg daily. Call in #30 with 2 rf

## 2016-10-09 NOTE — Telephone Encounter (Signed)
Pt would like to have something different for his blood pressure and the lisinopril made him have a gagging cough.  Pharm:  [facility]

## 2016-10-12 MED ORDER — AMLODIPINE BESYLATE 5 MG PO TABS: 5.0000 mg | ORAL_TABLET | Freq: Every day | ORAL | 2 refills | Status: DC

## 2016-10-12 NOTE — Telephone Encounter (Signed)
[facility],removed Lisinopril from current medication list, added it to drug allergy list due to cough and spoke with pt.

## 2016-11-02 ENCOUNTER — Other Ambulatory Visit: Payer: Self-pay | Admitting: Family Medicine

## 2016-11-02 NOTE — Telephone Encounter (Signed)
Refill request for Amlodipine 5 mg take 1 po qd and a 90 day supply to [facility] mail order.

## 2016-11-04 MED ORDER — AMLODIPINE BESYLATE 5 MG PO TABS: 5.0000 mg | ORAL_TABLET | Freq: Every day | ORAL | 0 refills | Status: DC

## 2016-11-04 NOTE — Telephone Encounter (Signed)
I sent script e-scribe to mail order.  

## 2016-11-23 ENCOUNTER — Telehealth: Payer: Self-pay | Admitting: Family Medicine

## 2016-11-23 NOTE — Telephone Encounter (Signed)
Request for Omeprazole 40 mg and a 90 day supply to [facility] mail order. Dose is different on medication list.

## 2016-11-24 MED ORDER — OMEPRAZOLE 40 MG PO CPDR: 40.0000 mg | DELAYED_RELEASE_CAPSULE | Freq: Every day | ORAL | 3 refills | Status: DC

## 2016-11-24 NOTE — Telephone Encounter (Signed)
Done

## 2016-12-15 DIAGNOSIS — L218 Other seborrheic dermatitis: Secondary | ICD-10-CM | POA: Diagnosis not present

## 2016-12-15 DIAGNOSIS — L570 Actinic keratosis: Secondary | ICD-10-CM | POA: Diagnosis not present

## 2016-12-21 ENCOUNTER — Ambulatory Visit (INDEPENDENT_AMBULATORY_CARE_PROVIDER_SITE_OTHER): Payer: Self-pay

## 2016-12-21 ENCOUNTER — Encounter (INDEPENDENT_AMBULATORY_CARE_PROVIDER_SITE_OTHER): Payer: Self-pay | Admitting: Orthopaedic Surgery

## 2016-12-21 ENCOUNTER — Ambulatory Visit (INDEPENDENT_AMBULATORY_CARE_PROVIDER_SITE_OTHER): Payer: 59 | Admitting: Orthopaedic Surgery

## 2016-12-21 DIAGNOSIS — M7661 Achilles tendinitis, right leg: Secondary | ICD-10-CM | POA: Diagnosis not present

## 2016-12-21 MED ORDER — PREDNISONE 10 MG (21) PO TBPK: ORAL_TABLET | ORAL | 0 refills | Status: DC

## 2016-12-21 MED ORDER — DICLOFENAC EPOLAMINE 1.3 % TD PTCH: 1.0000 | MEDICATED_PATCH | Freq: Two times a day (BID) | TRANSDERMAL | 0 refills | Status: DC

## 2016-12-21 NOTE — Progress Notes (Signed)
Office Visit Note   Patient: Dale [name]           Date of Birth: 1960-06-17           MRN: [id number] Visit Date: 12/21/2016              Requested by: Laurey Morale, MD [place], [place] [address] PCP: Laurey Morale, MD   Assessment & Plan: Visit Diagnoses:  1. Right Achilles tendinitis     Plan: Impression is right insertional Achilles tendinosis.  Prescription for prednisone and Flector patches.  Cam walker for couple weeks.  Wean as tolerated.  We did discuss physical therapy but patient would like to decline that for now.  Follow-up as needed.  Follow-Up Instructions: Return if symptoms worsen or fail to improve.   Orders:  Orders Placed This Encounter  Procedures  . XR Foot 2 Views Right   Meds ordered this encounter  Medications  . predniSONE (STERAPRED UNI-PAK 21 TAB) 10 MG (21) TBPK tablet    Sig: Take as directed    Dispense:  21 tablet    Refill:  0  . diclofenac (FLECTOR) 1.3 % PTCH    Sig: Place 1 patch onto the skin 2 (two) times daily.    Dispense:  10 patch    Refill:  0      Procedures: No procedures performed   Clinical Data: No additional findings.   Subjective: Chief Complaint  Patient presents with  . Right Foot - Pain    Patient is a 56 year old gentleman comes in with acute right heel pain with recent worsening.  Denies any injuries or numbness and tingling.  Denies any radiation of pain.  The pain is worse with ambulation and with walking.    Review of Systems  Constitutional: Negative.   All other systems reviewed and are negative.    Objective: Vital Signs: There were no vitals taken for this visit.  Physical Exam  Constitutional: He is oriented to person, place, and time. He appears well-developed and well-nourished.  HENT:  Head: Normocephalic and atraumatic.  Eyes: Pupils are equal, round, and reactive to light.  Neck: Neck supple.  Pulmonary/Chest: Effort normal.  Abdominal: Soft.    Musculoskeletal: Normal range of motion.  Neurological: He is alert and oriented to person, place, and time.  Skin: Skin is warm.  Psychiatric: He has a normal mood and affect. His behavior is normal. Judgment and thought content normal.  Nursing note and vitals reviewed.   Ortho Exam Right heel exam shows tenderness at the insertion of the Achilles.  Plantar fascia insertion is nontender.  Posterior tibial tendons nontender. Specialty Comments:  No specialty comments available.  Imaging: Xr Foot 2 Views Right  Result Date: 12/21/2016 Small calcification at achilles insertion    PMFS History: Patient Active Problem List   Diagnosis Date Noted  . Right Achilles tendinitis 12/21/2016  . HTN (hypertension) 03/03/2016  . OA (osteoarthritis) of knee 07/10/2013  . Genital herpes 05/02/2013  . CHEST PAIN 08/19/2009  . LOW BACK PAIN 06/03/2009  . ERECTILE DYSFUNCTION 06/07/2008  . HYPERLIPIDEMIA 04/03/2008  . DEPRESSION 04/03/2008  . Allergic rhinitis 04/03/2008  . INSOMNIA 04/03/2008  . NEPHROLITHIASIS, HX OF 04/03/2008   Past Medical History:  Diagnosis Date  . Allergy   . Anxiety   . Arthritis    "SOME IN BACK"  . Chest pain, non-cardiac    normal stress test on 08-26-09   . Depression   .  GERD (gastroesophageal reflux disease)    TAKES MED  . History of nephrolithiasis   . Hyperlipidemia     Family History  Problem Relation Age of Onset  . Heart attack Father   . Coronary artery disease Father   . Depression Unknown        family hx  . Hyperlipidemia Unknown        family hx  . Hypertension Unknown        family hx  . Lung cancer Unknown        family hx  . Stroke Unknown        family hx  . Colon cancer Neg Hx   . Esophageal cancer Neg Hx   . Rectal cancer Neg Hx   . Stomach cancer Neg Hx     Past Surgical History:  Procedure Laterality Date  . BACK SURGERY    . COLONOSCOPY  05-04-13   per Dr. Hilarie Fredrickson, adenomatous polyps, repeat in 5 yrs   . KNEE  ARTHROSCOPY Right 01/09/2013   Procedure: ARTHROSCOPY KNEE;  Surgeon: Marybelle Killings, MD;  Location: [facility];  Service: Orthopedics;  Laterality: Right;  Right knee scope, partial medial menisectomy  . KNEE SURGERY  83, 89, 90, 01, 02,14   rt knee x 6  . KNEE SURGERY  2007   left knee meniscus repair  . POSTERIOR LAMINECTOMY / DECOMPRESSION LUMBAR SPINE  12-21-11   per Dr. Earle Gell   . ROTATOR CUFF REPAIR  2003  . TOTAL KNEE ARTHROPLASTY Right 07/10/2013   Procedure: RIGHT TOTAL KNEE ARTHROPLASTY;  Surgeon: Gearlean Alf, MD;  Location: [facility];  Service: Orthopedics;  Laterality: Right;   Social History   Occupational History  . Not on file  Tobacco Use  . Smoking status: Never Smoker  . Smokeless tobacco: Never Used  Substance and Sexual Activity  . Alcohol use: Yes    Alcohol/week: 0.0 oz    Comment: occ  . Drug use: No  . Sexual activity: Yes

## 2016-12-23 ENCOUNTER — Telehealth (INDEPENDENT_AMBULATORY_CARE_PROVIDER_SITE_OTHER): Payer: Self-pay | Admitting: Orthopaedic Surgery

## 2016-12-23 ENCOUNTER — Other Ambulatory Visit (INDEPENDENT_AMBULATORY_CARE_PROVIDER_SITE_OTHER): Payer: Self-pay

## 2016-12-23 MED ORDER — DICLOFENAC EPOLAMINE 1.3 % TD PTCH: 1.0000 | MEDICATED_PATCH | Freq: Two times a day (BID) | TRANSDERMAL | 0 refills | Status: DC

## 2016-12-23 NOTE — Telephone Encounter (Signed)
See message below. Please advise. I believe they ment to say RX for "Flector"  Patches

## 2016-12-23 NOTE — Telephone Encounter (Signed)
Patient called saying that he got a RX for "flexer" patches, but only received 10 patches and was asking for a new RX for 30 instead. CB # [phone]

## 2016-12-23 NOTE — Telephone Encounter (Signed)
Refax order for #30

## 2016-12-23 NOTE — Telephone Encounter (Signed)
Ok that's fine

## 2017-01-22 ENCOUNTER — Other Ambulatory Visit: Payer: Self-pay | Admitting: Family Medicine

## 2017-02-03 ENCOUNTER — Other Ambulatory Visit: Payer: Self-pay | Admitting: Family Medicine

## 2017-02-03 NOTE — Telephone Encounter (Signed)
Last OV 08/26/2016.  Rx was last refilled 06/03/2016 disp 270 with 1 refill.  Sent to PCP for approval.

## 2017-02-05 NOTE — Telephone Encounter (Signed)
Call in #270 with one rf 

## 2017-02-08 ENCOUNTER — Telehealth: Payer: Self-pay | Admitting: Family Medicine

## 2017-02-08 NOTE — Telephone Encounter (Signed)
Copied from [place]. Topic: Quick Communication - See Telephone Encounter >> Feb 08, 2017  1:52 PM Ether Griffins B wrote: CRM for notification. See Telephone encounter for:  Pt would like lorazepam refilled for 90 days and sent [facility] 02/08/17.

## 2017-02-09 ENCOUNTER — Encounter: Payer: Self-pay | Admitting: Family Medicine

## 2017-02-09 ENCOUNTER — Ambulatory Visit: Payer: 59 | Admitting: Family Medicine

## 2017-02-09 VITALS — BP 118/62 | HR 101 | Temp 98.2°F | Wt 202.4 lb

## 2017-02-09 DIAGNOSIS — R6889 Other general symptoms and signs: Secondary | ICD-10-CM | POA: Diagnosis not present

## 2017-02-09 DIAGNOSIS — J018 Other acute sinusitis: Secondary | ICD-10-CM

## 2017-02-09 LAB — POC INFLUENZA A&B (BINAX/QUICKVUE): Influenza A, POC: NEGATIVE

## 2017-02-09 MED ORDER — AZITHROMYCIN 250 MG PO TABS: ORAL_TABLET | ORAL | 0 refills | Status: DC

## 2017-02-09 NOTE — Progress Notes (Signed)
   Subjective:    Patient ID: Dale [name], male    DOB: 08-Oct-1960, 57 y.o.   MRN: [id number]  HPI Here for one week of sinus pressure, PND, dry cough and diarrhea. No fever.    Review of Systems  Constitutional: Positive for fever.  HENT: Positive for congestion, postnasal drip and sinus pressure. Negative for sinus pain and sore throat.   Eyes: Negative.   Respiratory: Positive for cough.   Gastrointestinal: Positive for diarrhea. Negative for abdominal pain, nausea and vomiting.       Objective:   Physical Exam  Constitutional: He appears well-developed and well-nourished.  HENT:  Right Ear: External ear normal.  Left Ear: External ear normal.  Nose: Nose normal.  Mouth/Throat: Oropharynx is clear and moist.  Eyes: Conjunctivae are normal.  Neck: No thyromegaly present.  Cardiovascular: Normal rate, regular rhythm, normal heart sounds and intact distal pulses.  Pulmonary/Chest: Effort normal and breath sounds normal. No respiratory distress. He has no wheezes. He has no rales.  Abdominal: Soft. Bowel sounds are normal. He exhibits no distension and no mass. There is no tenderness. There is no rebound and no guarding.  Lymphadenopathy:    He has no cervical adenopathy.          Assessment & Plan:  Sinusitis, treat with a Zpack.  Alysia Penna, MD

## 2017-02-09 NOTE — Telephone Encounter (Signed)
Called and spoke with pt. Advised him that I [facility] and called in his prescription in form him. Should be shipped din the next 5-7 days.

## 2017-02-09 NOTE — Telephone Encounter (Signed)
This was done last refilled 02/05/2017 disp 270 with 1 refill. Will Call [facility] to see if they got this or not.

## 2017-02-26 ENCOUNTER — Encounter: Payer: Self-pay | Admitting: Family Medicine

## 2017-02-26 ENCOUNTER — Ambulatory Visit (INDEPENDENT_AMBULATORY_CARE_PROVIDER_SITE_OTHER): Payer: 59

## 2017-02-26 ENCOUNTER — Other Ambulatory Visit: Payer: Self-pay

## 2017-02-26 ENCOUNTER — Ambulatory Visit: Payer: 59 | Admitting: Family Medicine

## 2017-02-26 VITALS — BP 144/88 | HR 81 | Temp 98.9°F | Resp 18 | Ht 71.89 in | Wt 203.8 lb

## 2017-02-26 DIAGNOSIS — M25532 Pain in left wrist: Secondary | ICD-10-CM

## 2017-02-26 DIAGNOSIS — M19032 Primary osteoarthritis, left wrist: Secondary | ICD-10-CM | POA: Diagnosis not present

## 2017-02-26 DIAGNOSIS — M25432 Effusion, left wrist: Secondary | ICD-10-CM | POA: Diagnosis not present

## 2017-02-26 MED ORDER — MELOXICAM 15 MG PO TABS: 15.0000 mg | ORAL_TABLET | Freq: Every day | ORAL | 0 refills | Status: DC

## 2017-02-26 MED ORDER — OMEPRAZOLE 20 MG PO CPDR: 20.0000 mg | DELAYED_RELEASE_CAPSULE | Freq: Every day | ORAL | 0 refills | Status: DC

## 2017-02-26 NOTE — Patient Instructions (Addendum)
     IF you received an x-ray today, you will receive an invoice from [facility]. Please contact [facility] at [phone] with questions or concerns regarding your invoice.   IF you received labwork today, you will receive an invoice from [facility]. Please contact [facility] at [phone] with questions or concerns regarding your invoice.   Our billing staff will not be able to assist you with questions regarding bills from these companies.  You will be contacted with the lab results as soon as they are available. The fastest way to get your results is to activate your My Chart account. Instructions are located on the last page of this paperwork. If you have not heard from [place] regarding the results in 2 weeks, please contact this office.     Wrist Pain, Adult There are many things that can cause wrist pain. Some common causes include:  An injury to the wrist area.  Overuse of the joint.  A condition that causes too much pressure to be put on a nerve in the wrist (carpal tunnel syndrome).  Wear and tear of the joints that happens as a person gets older (osteoarthritis).  Other types of arthritis.  Sometimes, the cause of wrist pain is not known. Often, the pain goes away when you follow your doctor's instructions for helping pain at home, such as resting or icing your wrist. If your wrist pain does not go away, it is important to tell your doctor. Follow these instructions at home:  Rest the wrist area for 48 hours or more, or as long as told by your doctor.  If a splint or elastic bandage has been put on your wrist, use it as told by your doctor. ? Take off the splint or bandage only as told by your doctor. ? Loosen the splint or bandage if your fingers tingle, lose feeling (get numb), or turn cold or blue.  If directed, apply ice to the injured area: ? If you have a removable splint or elastic bandage, remove it as told by your doctor. ? Put ice in a plastic  bag. ? Place a towel between your skin and the bag or between your splint or bandage and the bag. ? Leave the ice on for 20 minutes, 2-3 times a day.  Keep your arm raised (elevated) above the level of your heart while you are sitting or lying down.  Take over-the-counter and prescription medicines only as told by your doctor.  Keep all follow-up visits as told by your doctor. This is important. Contact a doctor if:  You have a sudden sharp pain in the wrist, hand, or arm that is different or new.  The swelling or bruising on your wrist or hand gets worse.  Your skin becomes red, gets a rash, or has open sores.  Your pain does not get better or it gets worse. Get help right away if:  You lose feeling in your fingers or hand.  Your fingers turn white, very red, or cold and blue.  You cannot move your fingers.  You have a fever or chills. This information is not intended to replace advice given to you by your health care provider. Make sure you discuss any questions you have with your health care provider. Document Released: 06/24/2007 Document Revised: 08/01/2015 Document Reviewed: 07/25/2015 Elsevier Interactive Patient Education  [employer].

## 2017-02-26 NOTE — Progress Notes (Signed)
2/8/201912:03 PM  Dale [name] 09-10-1960, 57 y.o. male [id number]  Chief Complaint  Patient presents with  . Wrist Pain    X 3 days- left    HPI:   Patient is a 57 y.o. male who presents today for 3 days of left wrist pain and swelling, no redness or warmth. No trauma, injury or inciting event. Remote h/o fractured wrist. No known gout, no other joints involved. No weakness, numbness or tingling. Patient is right handed.  Depression screen PHQ 2/9 02/26/2017  Decreased Interest 0  Down, Depressed, Hopeless 0  PHQ - 2 Score 0    Allergies  Allergen Reactions  . Lisinopril Cough    Prior to Admission medications   Medication Sig Start Date End Date Taking? Authorizing Provider  amLODipine (NORVASC) 5 MG tablet TAKE 1 TABLET DAILY 01/22/17  Yes Laurey Morale, MD  aspirin 81 MG tablet Take 81 mg by mouth every other day.   Yes [provider]  atorvastatin (LIPITOR) 10 MG tablet Take 1 tablet (10 mg total) by mouth every morning. 06/03/16  Yes Laurey Morale, MD  diclofenac (FLECTOR) 1.3 % PTCH Place 1 patch onto the skin 2 (two) times daily. 12/23/16  Yes Leandrew Koyanagi, MD  LORazepam (ATIVAN) 2 MG tablet TAKE 1 TABLET EVERY 6 HOURSAS NEEDED FOR ANXIETY 02/05/17  Yes Laurey Morale, MD  oxyCODONE-acetaminophen (PERCOCET/ROXICET) 5-325 MG tablet  01/27/16  Yes Newman Pies, MD  ranitidine (ZANTAC) 150 MG capsule Take 150 mg by mouth 2 (two) times daily.   Yes [provider]  sildenafil (VIAGRA) 100 MG tablet TAKE 1 TABLET (100 MG TOTAL) BY MOUTH DAILY AS NEEDED FOR ERECTILE DYSFUNCTION. 06/03/16  Yes Laurey Morale, MD  valACYclovir (VALTREX) 500 MG tablet TAKE 1 TABLET (500 MG TOTAL) BY MOUTH DAILY. 06/03/16  Yes Laurey Morale, MD    Past Medical History:  Diagnosis Date  . Allergy   . Anxiety   . Arthritis    "SOME IN BACK"  . Chest pain, non-cardiac    normal stress test on 08-26-09   . Depression   . GERD (gastroesophageal reflux disease)    TAKES MED    . History of nephrolithiasis   . Hyperlipidemia     Past Surgical History:  Procedure Laterality Date  . BACK SURGERY    . COLONOSCOPY  05-04-13   per Dr. Hilarie Fredrickson, adenomatous polyps, repeat in 5 yrs   . JOINT REPLACEMENT    . KNEE ARTHROSCOPY Right 01/09/2013   Procedure: ARTHROSCOPY KNEE;  Surgeon: Marybelle Killings, MD;  Location: [facility];  Service: Orthopedics;  Laterality: Right;  Right knee scope, partial medial menisectomy  . KNEE SURGERY  83, 89, 90, 01, 02,14   rt knee x 6  . KNEE SURGERY  2007   left knee meniscus repair  . POSTERIOR LAMINECTOMY / DECOMPRESSION LUMBAR SPINE  12-21-11   per Dr. Earle Gell   . ROTATOR CUFF REPAIR  2003  . SPINE SURGERY    . TOTAL KNEE ARTHROPLASTY Right 07/10/2013   Procedure: RIGHT TOTAL KNEE ARTHROPLASTY;  Surgeon: Gearlean Alf, MD;  Location: [facility];  Service: Orthopedics;  Laterality: Right;  Marland Kitchen VASECTOMY      Social History   Tobacco Use  . Smoking status: Never Smoker  . Smokeless tobacco: Never Used  Substance Use Topics  . Alcohol use: Yes    Alcohol/week: 3.0 oz    Types: 3 Glasses of wine, 2 Shots  of liquor per week    Family History  Problem Relation Age of Onset  . Heart attack Father   . Coronary artery disease Father   . Heart disease Father   . Depression Unknown        family hx  . Hyperlipidemia Unknown        family hx  . Hypertension Unknown        family hx  . Lung cancer Unknown        family hx  . Stroke Unknown        family hx  . Hypertension Mother   . Hyperlipidemia Mother   . Colon cancer Neg Hx   . Esophageal cancer Neg Hx   . Rectal cancer Neg Hx   . Stomach cancer Neg Hx     ROS Per hpi  OBJECTIVE:  Blood pressure (!) 144/88, pulse 81, temperature 98.9 F (37.2 C), temperature source Oral, resp. rate 18, height 5' 11.89" (1.826 m), weight 203 lb 12.8 oz (92.4 kg), SpO2 95 %.  Physical Exam  Gen: AAOx3, NAD Left wrist: mild swelling and TTP along ulnar side, no erythema or warmth,  FROM, normal strength and sensation, brisk cap refill   Dg Wrist 2 Views Left  Result Date: 02/26/2017 CLINICAL DATA:  LEFT wrist pain and swelling for 3 days EXAM: LEFT WRIST - 2 VIEW COMPARISON:  None FINDINGS: Osseous mineralization normal. Joint spaces preserved. Chondrocalcinosis at triangular fibrocartilage complex. Small old appearing corticated ossicles at the dorsal margin of the wrist on lateral view. No acute fracture, dislocation, or bone destruction. IMPRESSION: No acute osseous abnormalities. Minimal degenerative changes with question CPPD. Electronically Signed   By: Lavonia Dana M.D.   On: 02/26/2017 12:27     ASSESSMENT and PLAN  1. Pain and swelling of left wrist Discussed conservative measures, incl RICE therapy, RTC precautions reviewed.  - DG Wrist 2 Views Left; Future  Other orders - omeprazole (PRILOSEC) 20 MG capsule; Take 1 capsule (20 mg total) by mouth daily. - meloxicam (MOBIC) 15 MG tablet; Take 1 tablet (15 mg total) by mouth daily.  Return if symptoms worsen or fail to improve.    Rutherford Guys, MD [facility] [place], [place] [address] Ph.  [phone] Fax [phone]

## 2017-03-01 ENCOUNTER — Ambulatory Visit: Payer: 59 | Admitting: Family Medicine

## 2017-03-01 ENCOUNTER — Encounter: Payer: Self-pay | Admitting: Family Medicine

## 2017-03-01 VITALS — BP 116/78 | HR 87 | Temp 87.0°F | Wt 206.2 lb

## 2017-03-01 DIAGNOSIS — M25532 Pain in left wrist: Secondary | ICD-10-CM

## 2017-03-01 DIAGNOSIS — K219 Gastro-esophageal reflux disease without esophagitis: Secondary | ICD-10-CM | POA: Diagnosis not present

## 2017-03-01 MED ORDER — PANTOPRAZOLE SODIUM 20 MG PO TBEC: 20.0000 mg | DELAYED_RELEASE_TABLET | Freq: Every day | ORAL | 2 refills | Status: DC

## 2017-03-01 NOTE — Progress Notes (Signed)
   Subjective:    Patient ID: Dale [name], male    DOB: 1960/06/14, 57 y.o.   MRN: [id number]  HPI Here for 2 issues. First he went to urgent care last week for sudden onset of swelling and pain in the left wrist. No recent trauma. No erythema or warmth. The etiology was unclear, though gout was suspected. He treated it with ice and Ibuprofen and it cleared up completely in 2 days. Also he asks to try Protonix for his GERD. Omeprazole made his back hurt and Zantac did not help.   Review of Systems  Constitutional: Negative.   Respiratory: Negative.   Cardiovascular: Negative.   Musculoskeletal: Positive for arthralgias and joint swelling.       Objective:   Physical Exam  Constitutional: He appears well-developed and well-nourished.  Cardiovascular: Normal rate, regular rhythm, normal heart sounds and intact distal pulses.  Pulmonary/Chest: Effort normal and breath sounds normal. No respiratory distress. He has no wheezes. He has no rales.  Abdominal: Soft. Bowel sounds are normal. He exhibits no distension and no mass. There is no tenderness. There is no rebound and no guarding.  Musculoskeletal:  Left wrist is normal on exam. No swelling or tenderness, full ROM           Assessment & Plan:  The wrist pain was likely due to a mild case of gout. He will follow up if this happens again. For the GERD, he will try Protonix 20 mg daily.  Alysia Penna, MD

## 2017-03-17 ENCOUNTER — Other Ambulatory Visit: Payer: Self-pay | Admitting: Podiatry

## 2017-03-17 ENCOUNTER — Ambulatory Visit: Payer: 59 | Admitting: Podiatry

## 2017-03-17 ENCOUNTER — Encounter: Payer: Self-pay | Admitting: Podiatry

## 2017-03-17 ENCOUNTER — Ambulatory Visit (INDEPENDENT_AMBULATORY_CARE_PROVIDER_SITE_OTHER): Payer: 59

## 2017-03-17 DIAGNOSIS — M7661 Achilles tendinitis, right leg: Secondary | ICD-10-CM

## 2017-03-17 DIAGNOSIS — M79671 Pain in right foot: Secondary | ICD-10-CM

## 2017-03-17 MED ORDER — TRIAMCINOLONE ACETONIDE 10 MG/ML IJ SUSP
10.0000 mg | Freq: Once | INTRAMUSCULAR | Status: AC
  Administered 2017-03-17: 10 mg

## 2017-03-17 NOTE — Progress Notes (Signed)
   Subjective:    Patient ID: Dale [name], male    DOB: [birth year], 57 y.o.   MRN: [id number]  HPI    Review of Systems  All other systems reviewed and are negative.      Objective:   Physical Exam        Assessment & Plan:

## 2017-03-17 NOTE — Patient Instructions (Signed)

## 2017-03-17 NOTE — Progress Notes (Signed)
g ri 

## 2017-03-18 NOTE — Progress Notes (Signed)
Subjective:   Patient ID: Dale [name], male   DOB: 57 y.o.   MRN: [id number]   HPI Patient presents stating he has had off and on discomfort in the right posterior heel and states it is gotten worse over the last month.  States it hard for him to be active or exercise and gradually becoming more of an issue for him.  Patient does not smoke currently and likes to be active and this is impeding his activity levels   Review of Systems  All other systems reviewed and are negative.       Objective:  Physical Exam  Constitutional: He appears well-developed and well-nourished.  Cardiovascular: Intact distal pulses.  Pulmonary/Chest: Effort normal.  Musculoskeletal: Normal range of motion.  Neurological: He is alert.  Skin: Skin is warm.  Nursing note and vitals reviewed.   Neurovascular status found to be intact with muscle strength adequate range of motion within normal limits with patient found to have posterior medial pain of the right Achilles at its insertion into the calcaneus.  Central lateral band are not painful and there is minimal swelling associated with this.  Patient has slight discoloration of several nails but it appears to be more trauma with thickness of the nailbeds but no indications of fungal infection.  Patient has good digital perfusion well oriented x3     Assessment:  Probability for posterior Achilles tendinitis medial side right heel with traumatized nailbeds also noted     Plan:  H&P and x-rays reviewed.  I have advised careful injection treatment and I did explain the chances for risk and rupture associated with injection in this area.  Patient wants to accept risk and at this time I did a sterile prep of the right medial posterior heel and injected with 3 mg dexamethasone Kenalog 5 mg Xylocaine and kept away from the central lateral side of the Achilles tendon.  I advised this patient on reduced activity and I dispensed a silicone sleeve to take pressure off  the back of the heel and advised on gradual stretching exercises.  Reappoint 3 weeks to recheck  X-rays indicate minimal spur formation with no indications of advanced deformity or disease

## 2017-03-26 ENCOUNTER — Telehealth: Payer: Self-pay

## 2017-03-26 MED ORDER — PANTOPRAZOLE SODIUM 20 MG PO TBEC: 20.0000 mg | DELAYED_RELEASE_TABLET | Freq: Every day | ORAL | 0 refills | Status: DC

## 2017-03-26 NOTE — Telephone Encounter (Signed)
Pharmacy requesting a 90 day supply for Protonix. Rx has been sent.

## 2017-04-14 ENCOUNTER — Encounter: Payer: Self-pay | Admitting: Podiatry

## 2017-04-14 ENCOUNTER — Ambulatory Visit: Payer: 59 | Admitting: Podiatry

## 2017-04-14 DIAGNOSIS — M7661 Achilles tendinitis, right leg: Secondary | ICD-10-CM

## 2017-04-16 NOTE — Progress Notes (Signed)
Subjective:   Patient ID: Dale [name], male   DOB: 57 y.o.   MRN: [id number]   HPI Patient presents stating it is improving but still present in the right posterior heel region   ROS      Objective:  Physical Exam  Neurovascular status intact with significant improvement of the posterior heel right with inflammation upon deep palpation overall doing very well with good range of motion and no splinting by the patient     Assessment:  Doing well post Achilles tendinitis right with mild symptoms still present     Plan:  Reviewed condition and recommended the importance of range of motion exercises aggressive ice therapy cushioning of the area and heel lift.  Patient should continue to improve but is encouraged to come back if symptoms do not get better or if they were to get worse again in the near future or at any time

## 2017-04-19 ENCOUNTER — Encounter: Payer: Self-pay | Admitting: Family Medicine

## 2017-04-19 ENCOUNTER — Ambulatory Visit: Payer: 59 | Admitting: Family Medicine

## 2017-04-19 VITALS — BP 122/68 | HR 84 | Temp 97.7°F | Ht 71.75 in | Wt 200.6 lb

## 2017-04-19 DIAGNOSIS — J301 Allergic rhinitis due to pollen: Secondary | ICD-10-CM | POA: Diagnosis not present

## 2017-04-19 MED ORDER — METHYLPREDNISOLONE ACETATE 80 MG/ML IJ SUSP
120.0000 mg | Freq: Once | INTRAMUSCULAR | Status: AC
Start: 2017-04-19 — End: 2017-04-19
  Administered 2017-04-19: 120 mg via INTRAMUSCULAR

## 2017-04-19 MED ORDER — LORATADINE-PSEUDOEPHEDRINE ER 5-120 MG PO TB12: 1.0000 | ORAL_TABLET | Freq: Two times a day (BID) | ORAL | 0 refills | Status: DC | PRN

## 2017-04-19 NOTE — Progress Notes (Signed)
   Subjective:    Patient ID: Dale [name], male    DOB: [birth year], 57 y.o.   MRN: [id number]  HPI Here for his typical spring allergy issues, including itchy eyes, runny nose, PND, and sneezing. Talking Claritin D.   Review of Systems  Constitutional: Negative.   HENT: Positive for congestion, postnasal drip, rhinorrhea, sinus pressure and sneezing. Negative for sinus pain and sore throat.   Eyes: Positive for itching.  Respiratory: Negative.        Objective:   Physical Exam  Constitutional: He appears well-developed and well-nourished.  HENT:  Right Ear: External ear normal.  Left Ear: External ear normal.  Nose: Nose normal.  Mouth/Throat: Oropharynx is clear and moist.  Eyes: Conjunctivae are normal.  Neck: No thyromegaly present.  Pulmonary/Chest: Effort normal and breath sounds normal. No respiratory distress. He has no wheezes. He has no rales.  Lymphadenopathy:    He has no cervical adenopathy.          Assessment & Plan:  Seasonal allergies, given a steroid shot.  Alysia Penna, MD

## 2017-04-20 DIAGNOSIS — H04123 Dry eye syndrome of bilateral lacrimal glands: Secondary | ICD-10-CM | POA: Diagnosis not present

## 2017-04-22 ENCOUNTER — Other Ambulatory Visit: Payer: Self-pay | Admitting: Family Medicine

## 2017-04-22 NOTE — Telephone Encounter (Signed)
Last OV 04/19/2017   Last refilled 06/03/2016 disp 30 with 3 refills   Sent to PCP for approval

## 2017-04-22 NOTE — Telephone Encounter (Signed)
Viagra refill request.    Has used 3 refills.  LOV 04/19/17 with Dr. Sarajane Jews  [facility]   [place], [place]

## 2017-04-22 NOTE — Telephone Encounter (Signed)
Copied from [facility]. Topic: Quick Communication - See Telephone Encounter >> Apr 22, 2017 10:30 AM Ivar Drape wrote: CRM for notification. See Telephone encounter for: 04/22/17. The patient would like his sildenafil (VIAGRA) 100 MG tablet prescription refilled and sent to his preferred pharmacist for this prescription  [facility] [place] [facility] [facility] [phone] (Phone) [phone] (Fax)

## 2017-04-23 MED ORDER — SILDENAFIL CITRATE 100 MG PO TABS: ORAL_TABLET | ORAL | 5 refills | Status: DC

## 2017-05-14 ENCOUNTER — Encounter: Payer: Self-pay | Admitting: Internal Medicine

## 2017-05-14 ENCOUNTER — Encounter: Payer: Self-pay | Admitting: Physician Assistant

## 2017-05-14 ENCOUNTER — Encounter

## 2017-05-14 ENCOUNTER — Ambulatory Visit (INDEPENDENT_AMBULATORY_CARE_PROVIDER_SITE_OTHER): Payer: 59 | Admitting: Physician Assistant

## 2017-05-14 VITALS — BP 128/74 | HR 104 | Ht 71.0 in | Wt 198.0 lb

## 2017-05-14 DIAGNOSIS — K219 Gastro-esophageal reflux disease without esophagitis: Secondary | ICD-10-CM | POA: Diagnosis not present

## 2017-05-14 DIAGNOSIS — R1319 Other dysphagia: Secondary | ICD-10-CM | POA: Diagnosis not present

## 2017-05-14 MED ORDER — FAMOTIDINE 40 MG PO TABS: ORAL_TABLET | ORAL | 11 refills | Status: DC

## 2017-05-14 NOTE — Progress Notes (Addendum)
Subjective:    Patient ID: Dale [name], male    DOB: 1960-03-28, 57 y.o.   MRN: [id number]  HPI Dale [name] is a pleasant 57 year old white male, known to Dr. Hilarie Fredrickson who comes in today for evaluation of GERD and dysphaghia.  Patient was last here in April 2015 when he had screening colonoscopy.  He was found to have a 2 to 3 mm polyp in the sigmoid colon, path consistent with a tubular adenoma and exam was otherwise negative.  He is indicated for 5-year follow-up Patient has history of hypertension, osteoarthritis chronic low back pain and depression. He says he has been treating himself for reflux symptoms over the past 5 years.  Initially he was taking over-the-counter Prilosec 20 mg daily, eventually went off that and started on Zantac 150 mg twice daily.  He had been given a previous prescription for omeprazole but said that every time he takes it he seems to develop left-sided back pain goes away once he stops it.  He tried Protonix briefly and says that he had the same type of symptoms.  Lately he has been using Zantac on her 50 mg once daily and then alternating with omeprazole off and on.  If he does not take something he will have daily heartburn and indigestion.  He has been noticing some intermittent solid food dysphasia, not occurring on a daily basis, and no episodes of regurgitation.  He also says that he has some early satiety type symptoms which she has had for a long time and if he eats a lot will have excessive belching.  His Appetite Has Been Okay and His Weight Has Been Stable.  No current lower GI complaints. No regular aspirin or NSAID use. Patient does note that every time he takes Viagra he seems to get heartburn from that.  Review of Systems Pertinent positive and negative review of systems were noted in the above HPI section.  All other review of systems was otherwise negative.  Outpatient Encounter Medications as of 05/14/2017  Medication Sig  . amLODipine (NORVASC) 5 MG tablet  TAKE 1 TABLET DAILY  . aspirin 81 MG tablet Take 81 mg by mouth every other day.  Marland Kitchen atorvastatin (LIPITOR) 10 MG tablet Take 1 tablet (10 mg total) by mouth every morning.  . diclofenac (FLECTOR) 1.3 % PTCH Place 1 patch onto the skin 2 (two) times daily.  Marland Kitchen loratadine-pseudoephedrine (CLARITIN-D 12 HOUR) 5-120 MG tablet Take 1 tablet by mouth 2 (two) times daily as needed for allergies.  Marland Kitchen LORazepam (ATIVAN) 2 MG tablet TAKE 1 TABLET EVERY 6 HOURSAS NEEDED FOR ANXIETY  . oxyCODONE-acetaminophen (PERCOCET/ROXICET) 5-325 MG tablet   . pantoprazole (PROTONIX) 20 MG tablet Take 20 mg by mouth daily.  . ranitidine (ZANTAC) 150 MG tablet Take 150 mg by mouth 2 (two) times daily.  . sildenafil (VIAGRA) 100 MG tablet TAKE 1 TABLET (100 MG TOTAL) BY MOUTH DAILY AS NEEDED FOR ERECTILE DYSFUNCTION.  . valACYclovir (VALTREX) 500 MG tablet TAKE 1 TABLET (500 MG TOTAL) BY MOUTH DAILY.  . famotidine (PEPCID) 40 MG tablet Take 1 tablet twice daily.   No facility-administered encounter medications on file as of 05/14/2017.    Allergies  Allergen Reactions  . Lisinopril Cough   Patient Active Problem List   Diagnosis Date Noted  . GERD (gastroesophageal reflux disease) 03/01/2017  . Right Achilles tendinitis 12/21/2016  . HTN (hypertension) 03/03/2016  . OA (osteoarthritis) of knee 07/10/2013  . Genital herpes 05/02/2013  . CHEST  PAIN 08/19/2009  . LOW BACK PAIN 06/03/2009  . ERECTILE DYSFUNCTION 06/07/2008  . HYPERLIPIDEMIA 04/03/2008  . DEPRESSION 04/03/2008  . Allergic rhinitis 04/03/2008  . INSOMNIA 04/03/2008  . NEPHROLITHIASIS, HX OF 04/03/2008   Social History   Socioeconomic History  . Marital status: Divorced    Spouse name: Not on file  . Number of children: 1  . Years of education: Not on file  . Highest education level: Not on file  Occupational History  . Not on file  Social Needs  . Financial resource strain: Not on file  . Food insecurity:    Worry: Not on file     Inability: Not on file  . Transportation needs:    Medical: Not on file    Non-medical: Not on file  Tobacco Use  . Smoking status: Never Smoker  . Smokeless tobacco: Never Used  Substance and Sexual Activity  . Alcohol use: Yes    Alcohol/week: 3.0 oz    Types: 3 Glasses of wine, 2 Shots of liquor per week  . Drug use: No  . Sexual activity: Yes  Lifestyle  . Physical activity:    Days per week: Not on file    Minutes per session: Not on file  . Stress: Not on file  Relationships  . Social connections:    Talks on phone: Not on file    Gets together: Not on file    Attends religious service: Not on file    Active member of club or organization: Not on file    Attends meetings of clubs or organizations: Not on file    Relationship status: Not on file  . Intimate partner violence:    Fear of current or ex partner: Not on file    Emotionally abused: Not on file    Physically abused: Not on file    Forced sexual activity: Not on file  Other Topics Concern  . Not on file  Social History Narrative  . Not on file    Dale [name]'s family history includes Coronary artery disease in his father; Depression in his unknown relative; Heart attack in his father; Heart disease in his father; Hyperlipidemia in his mother and unknown relative; Hypertension in his mother and unknown relative; Lung cancer in his unknown relative; Stroke in his unknown relative.      Objective:    Vitals:   05/14/17 0828  BP: 128/74  Pulse: (!) 104    Physical Exam ; well-developed white male in no acute distress, pleasant blood pressure 128/74 pulse 104, height 5 foot 11, weight 198, BMI 27.6.  HEENT; nontraumatic normocephalic EOMI PERRLA sclera anicteric, Cardiovascular ;regular rate and rhythm with S1-S2 no murmur rub or gallop, Pulmonary ;clear bilaterally, Abdomen ;soft, nontender nondistended bowel sounds are active there is no palpable mass or hepatosplenomegaly, Rectal; exam not done, Neuro  psych; mood and affect appropriate      Assessment & Plan:   #12 57 year old white male with chronic GERD, occasionally refractory to twice daily ranitidine 150 mg. #2 intermittent solid food dysphagia rule out peptic stricture #3 history of tubular adenomatous colon polyp-up-to-date with colonoscopy last done April 2015, will be due for follow-up in April 2020 #4.  Chronic low back pain #  Hypertension  Plan We reviewed an antireflux regimen today, and he was given educational materials and an antireflux diet. He does not want to take chronic PPI if he can get by without it.  Will  try Pepcid 40 mg p.o.  twice daily, prescription sent with several refills. Advised he could use Mylanta or Maalox or Pepcid AC on a as needed basis for episodes of heartburn. She will be scheduled for upper endoscopy with possible esophageal dilation with Dr. Hilarie Fredrickson.  Procedure was discussed in detail with the patient including indications risks and benefits and he is agreeable to proceed.   Dale [name] Genia Harold PA-C 05/14/2017   Cc: Laurey Morale, MD  Addendum: Reviewed and agree with initial management. Pyrtle, Lajuan Lines, MD

## 2017-05-14 NOTE — Patient Instructions (Signed)
You have been scheduled for an endoscopy. Please follow written instructions given to you at your visit today. If you use inhalers (even only as needed), please bring them with you on the day of your procedure. Your physician has requested that you go to www.startemmi.com and enter the access code given to you at your visit today. This web site gives a general overview about your procedure. However, you should still follow specific instructions given to you by our office regarding your preparation for the procedure.  We sent a prescription for Pepcid 40 mg, to your phamacy. Twice daily dosing. We have provided you with Anti-reflux information.  We have provided you with a work note for today, 05-14-2017.  If you are age 22 or younger, your body mass index should be between 19-25. Your Body mass index is 27.62 kg/m. If this is out of the aformentioned range listed, please consider follow up with your Primary Care Provider.

## 2017-05-19 ENCOUNTER — Ambulatory Visit: Payer: 59 | Admitting: Nurse Practitioner

## 2017-05-25 ENCOUNTER — Other Ambulatory Visit: Payer: Self-pay | Admitting: Family Medicine

## 2017-05-28 ENCOUNTER — Other Ambulatory Visit: Payer: Self-pay

## 2017-05-28 ENCOUNTER — Ambulatory Visit (AMBULATORY_SURGERY_CENTER): Payer: 59 | Admitting: Internal Medicine

## 2017-05-28 ENCOUNTER — Encounter: Payer: Self-pay | Admitting: Internal Medicine

## 2017-05-28 VITALS — BP 131/85 | HR 67 | Temp 97.8°F | Resp 15 | Ht 71.0 in | Wt 198.0 lb

## 2017-05-28 DIAGNOSIS — R1310 Dysphagia, unspecified: Secondary | ICD-10-CM | POA: Diagnosis not present

## 2017-05-28 DIAGNOSIS — K219 Gastro-esophageal reflux disease without esophagitis: Secondary | ICD-10-CM

## 2017-05-28 DIAGNOSIS — R1319 Other dysphagia: Secondary | ICD-10-CM

## 2017-05-28 HISTORY — PX: ESOPHAGOGASTRODUODENOSCOPY: SHX1529

## 2017-05-28 MED ORDER — DEXLANSOPRAZOLE 60 MG PO CPDR: 60.0000 mg | DELAYED_RELEASE_CAPSULE | Freq: Every day | ORAL | 3 refills | Status: DC

## 2017-05-28 NOTE — Progress Notes (Signed)
Report to PACU, RN, vss, BBS= Clear.  

## 2017-05-28 NOTE — Progress Notes (Signed)
Called to room to assist during endoscopic procedure.  Patient ID and intended procedure confirmed with present staff. Received instructions for my participation in the procedure from the performing physician.  

## 2017-05-28 NOTE — Op Note (Signed)
[facility] Patient Name: Dale [name] Procedure Date: 05/28/2017 10:22 AM MRN: [id number] Endoscopist: Jerene Bears , MD Age: 57 Referring MD:  Date of Birth: 01/17/1961 Gender: Male Account #: [id number] Procedure:                Upper GI endoscopy Indications:              Dysphagia, Gastro-esophageal reflux disease                            (breakthrough heartburn symptoms despite famotidine                            40 mg twice daily, omeprazole and pantoprazole                            previously caused flank pain) Medicines:                Monitored Anesthesia Care Procedure:                Pre-Anesthesia Assessment:                           - Prior to the procedure, a History and Physical                            was performed, and patient medications and                            allergies were reviewed. The patient's tolerance of                            previous anesthesia was also reviewed. The risks                            and benefits of the procedure and the sedation                            options and risks were discussed with the patient.                            All questions were answered, and informed consent                            was obtained. Prior Anticoagulants: The patient has                            taken no previous anticoagulant or antiplatelet                            agents. ASA Grade Assessment: II - A patient with                            mild systemic disease. After reviewing the risks  and benefits, the patient was deemed in                            satisfactory condition to undergo the procedure.                           After obtaining informed consent, the endoscope was                            passed under direct vision. Throughout the                            procedure, the patient's blood pressure, pulse, and                            oxygen saturations were monitored  continuously. The                            Endoscope was introduced through the mouth, and                            advanced to the second part of duodenum. The upper                            GI endoscopy was accomplished without difficulty.                            The patient tolerated the procedure well. Scope In: Scope Out: Findings:                 LA Grade B (one or more mucosal breaks greater than                            5 mm, not extending between the tops of two mucosal                            folds) esophagitis with no bleeding was found 35 to                            37 cm from the incisors.                           The Z-line was irregular and was found 37 cm from                            the incisors. This was biopsied with a cold forceps                            for evaluation to rule out Barrett's Esophagus.                           A 2 cm hiatal hernia was present.  The entire examined stomach was normal.                           The examined duodenum was normal. Complications:            No immediate complications. Estimated Blood Loss:     Estimated blood loss was minimal. Impression:               - LA Grade B reflux esophagitis.                           - Z-line irregular, 37 cm from the incisors.                            Biopsied.                           - 2 cm hiatal hernia.                           - Normal stomach.                           - Normal examined duodenum. Recommendation:           - Patient has a contact number available for                            emergencies. The signs and symptoms of potential                            delayed complications were discussed with the                            patient. Return to normal activities tomorrow.                            Written discharge instructions were provided to the                            patient.                           - Resume  previous diet.                           - Continue present medications.                           - Trial of Dexilant 60 mg daily (famotidine can be                            used in the evening/at bedtime if needed for                            breakthrough heartburn)                           -  Await pathology results. Jerene Bears, MD 05/28/2017 10:46:26 AM This report has been signed electronically.

## 2017-05-28 NOTE — Patient Instructions (Addendum)
Thank you for allowing [place] to care for you today!  Await pathology results.  Trial of Dexilant 60 mg daily ( may use Famotidine aka Pepcid in the evening/bedtime if needed for breakthrough heartburn)  Handout given for hiatal hernia  Follow up appointment in 3 months.     YOU HAD AN ENDOSCOPIC PROCEDURE TODAY AT [facility]:   Refer to the procedure report that was given to you for any specific questions about what was found during the examination.  If the procedure report does not answer your questions, please call your gastroenterologist to clarify.  If you requested that your care partner not be given the details of your procedure findings, then the procedure report has been included in a sealed envelope for you to review at your convenience later.  YOU SHOULD EXPECT: Some feelings of bloating in the abdomen. Passage of more gas than usual.  Walking can help get rid of the air that was put into your GI tract during the procedure and reduce the bloating. If you had a lower endoscopy (such as a colonoscopy or flexible sigmoidoscopy) you may notice spotting of blood in your stool or on the toilet paper. If you underwent a bowel prep for your procedure, you may not have a normal bowel movement for a few days.  Please Note:  You might notice some irritation and congestion in your nose or some drainage.  This is from the oxygen used during your procedure.  There is no need for concern and it should clear up in a day or so.  SYMPTOMS TO REPORT IMMEDIATELY:    Following upper endoscopy (EGD)  Vomiting of blood or coffee ground material  New chest pain or pain under the shoulder blades  Painful or persistently difficult swallowing  New shortness of breath  Fever of 100F or higher  Black, tarry-looking stools  For urgent or emergent issues, a gastroenterologist can be reached at any hour by calling [phone].   DIET:  We do recommend a small meal at first, but then  you may proceed to your regular diet.  Drink plenty of fluids but you should avoid alcoholic beverages for 24 hours.  ACTIVITY:  You should plan to take it easy for the rest of today and you should NOT DRIVE or use heavy machinery until tomorrow (because of the sedation medicines used during the test).    FOLLOW UP: Our staff will call the number listed on your records the next business day following your procedure to check on you and address any questions or concerns that you may have regarding the information given to you following your procedure. If we do not reach you, we will leave a message.  However, if you are feeling well and you are not experiencing any problems, there is no need to return our call.  We will assume that you have returned to your regular daily activities without incident.  If any biopsies were taken you will be contacted by phone or by letter within the next 1-3 weeks.  Please call us at [phone] if you have not heard about the biopsies in 3 weeks.    SIGNATURES/CONFIDENTIALITY: You and/or your care partner have signed paperwork which will be entered into your electronic medical record.  These signatures attest to the fact that that the information above on your After Visit Summary has been reviewed and is understood.  Full responsibility of the confidentiality of this discharge information lies with you and/or your  care-partner.

## 2017-05-31 ENCOUNTER — Telehealth: Payer: Self-pay | Admitting: *Deleted

## 2017-05-31 ENCOUNTER — Telehealth: Payer: Self-pay

## 2017-05-31 ENCOUNTER — Ambulatory Visit: Payer: 59 | Admitting: Family Medicine

## 2017-05-31 ENCOUNTER — Encounter: Payer: Self-pay | Admitting: Family Medicine

## 2017-05-31 VITALS — BP 130/88 | HR 93 | Temp 98.5°F | Ht 71.0 in | Wt 198.6 lb

## 2017-05-31 DIAGNOSIS — J301 Allergic rhinitis due to pollen: Secondary | ICD-10-CM | POA: Diagnosis not present

## 2017-05-31 MED ORDER — METHYLPREDNISOLONE ACETATE 80 MG/ML IJ SUSP
120.0000 mg | Freq: Once | INTRAMUSCULAR | Status: AC
  Administered 2017-05-31: 120 mg via INTRAMUSCULAR

## 2017-05-31 NOTE — Addendum Note (Signed)
Addended by: Agnes Lawrence on: 05/31/2017 05:31 PM   Modules accepted: Orders

## 2017-05-31 NOTE — Telephone Encounter (Signed)
  Follow up Call-  Call back number 05/28/2017  Post procedure Call Back phone  # [phone]  Permission to leave phone message Yes  Some recent data might be hidden     Patient questions:  Do you have a fever, pain , or abdominal swelling? No. Pain Score  0 *  Have you tolerated food without any problems? Yes.    Have you been able to return to your normal activities? Yes.    Do you have any questions about your discharge instructions: Diet   No. Medications  No. Follow up visit  No.  Do you have questions or concerns about your Care? Yes.    Actions: * If pain score is 4 or above: No action needed, pain <4.

## 2017-05-31 NOTE — Telephone Encounter (Signed)
  Follow up Call-  Call back number 05/28/2017  Post procedure Call Back phone  # [phone]  Permission to leave phone message Yes  Some recent data might be hidden     Patient questions:  Do you have a fever, pain , or abdominal swelling? No. Pain Score  0 *  Have you tolerated food without any problems? Yes.    Have you been able to return to your normal activities? Yes.    Do you have any questions about your discharge instructions: Diet   No. Medications  No. Follow up visit  No.  Do you have questions or concerns about your Care? No.  Actions: * If pain score is 4 or above: No action needed, pain <4.

## 2017-05-31 NOTE — Progress Notes (Signed)
   Subjective:    Patient ID: Dale [name], male    DOB: 09/18/1960, 57 y.o.   MRN: [id number]  HPI Here for his typical allergies, causing itchy eyes and sneezing. He had a steroid shot here 6 weeks ago and this helped.    Review of Systems  Constitutional: Negative.   HENT: Positive for postnasal drip, rhinorrhea and sneezing. Negative for congestion, ear pain, sinus pain and trouble swallowing.   Eyes: Positive for redness and itching. Negative for discharge.  Respiratory: Negative.        Objective:   Physical Exam  Constitutional: He appears well-developed and well-nourished.  HENT:  Right Ear: External ear normal.  Left Ear: External ear normal.  Nose: Nose normal.  Mouth/Throat: Oropharynx is clear and moist.  Eyes: Conjunctivae are normal.  Neck: No thyromegaly present.  Pulmonary/Chest: Effort normal and breath sounds normal.  Lymphadenopathy:    He has no cervical adenopathy.          Assessment & Plan:  Allergies, given another steroid shot. Alysia Penna, MD

## 2017-06-01 ENCOUNTER — Encounter: Payer: Self-pay | Admitting: Internal Medicine

## 2017-07-01 DIAGNOSIS — M542 Cervicalgia: Secondary | ICD-10-CM | POA: Diagnosis not present

## 2017-07-01 DIAGNOSIS — M5412 Radiculopathy, cervical region: Secondary | ICD-10-CM | POA: Diagnosis not present

## 2017-07-01 DIAGNOSIS — M5442 Lumbago with sciatica, left side: Secondary | ICD-10-CM | POA: Diagnosis not present

## 2017-07-08 DIAGNOSIS — M5442 Lumbago with sciatica, left side: Secondary | ICD-10-CM | POA: Diagnosis not present

## 2017-07-14 DIAGNOSIS — M542 Cervicalgia: Secondary | ICD-10-CM | POA: Diagnosis not present

## 2017-07-14 DIAGNOSIS — M47816 Spondylosis without myelopathy or radiculopathy, lumbar region: Secondary | ICD-10-CM | POA: Diagnosis not present

## 2017-07-14 DIAGNOSIS — M5442 Lumbago with sciatica, left side: Secondary | ICD-10-CM | POA: Diagnosis not present

## 2017-07-14 DIAGNOSIS — M5412 Radiculopathy, cervical region: Secondary | ICD-10-CM | POA: Diagnosis not present

## 2017-07-18 ENCOUNTER — Other Ambulatory Visit: Payer: Self-pay | Admitting: Family Medicine

## 2017-07-20 DIAGNOSIS — M4802 Spinal stenosis, cervical region: Secondary | ICD-10-CM | POA: Diagnosis not present

## 2017-07-20 DIAGNOSIS — M4722 Other spondylosis with radiculopathy, cervical region: Secondary | ICD-10-CM | POA: Diagnosis not present

## 2017-07-20 DIAGNOSIS — M542 Cervicalgia: Secondary | ICD-10-CM | POA: Diagnosis not present

## 2017-07-28 DIAGNOSIS — M545 Low back pain: Secondary | ICD-10-CM | POA: Diagnosis not present

## 2017-08-01 ENCOUNTER — Other Ambulatory Visit: Payer: Self-pay | Admitting: Family Medicine

## 2017-08-02 DIAGNOSIS — M545 Low back pain: Secondary | ICD-10-CM | POA: Diagnosis not present

## 2017-08-16 DIAGNOSIS — M545 Low back pain: Secondary | ICD-10-CM | POA: Diagnosis not present

## 2017-08-20 DIAGNOSIS — M545 Low back pain: Secondary | ICD-10-CM | POA: Diagnosis not present

## 2017-08-24 ENCOUNTER — Encounter: Payer: Self-pay | Admitting: Family Medicine

## 2017-08-24 ENCOUNTER — Ambulatory Visit: Payer: 59 | Admitting: Family Medicine

## 2017-08-24 VITALS — BP 120/80 | HR 97 | Temp 97.0°F | Ht 71.0 in | Wt 198.2 lb

## 2017-08-24 DIAGNOSIS — M545 Low back pain, unspecified: Secondary | ICD-10-CM

## 2017-08-24 LAB — POCT URINALYSIS DIPSTICK
Bilirubin, UA: NEGATIVE
Blood, UA: NEGATIVE
Glucose, UA: NEGATIVE
Ketones, UA: NEGATIVE
Leukocytes, UA: NEGATIVE
Nitrite, UA: NEGATIVE
Protein, UA: NEGATIVE
Spec Grav, UA: 1.015 (ref 1.010–1.025)
Urobilinogen, UA: 0.2 E.U./dL
pH, UA: 6 (ref 5.0–8.0)

## 2017-08-24 NOTE — Progress Notes (Signed)
   Subjective:    Patient ID: Stephannie Peters, male    DOB: Jan 01, 1961, 57 y.o.   MRN: [id number]  HPI Here for 2 weeks of intermittent pain in the left lower back and left flank. No recent trauma. No urinary urgency or blood. BMs have been normal. No fever.    Review of Systems  Constitutional: Negative.   Respiratory: Negative.   Cardiovascular: Negative.   Gastrointestinal: Negative.   Genitourinary: Negative.   Musculoskeletal: Positive for back pain.       Objective:   Physical Exam  Constitutional: He appears well-developed and well-nourished.  Cardiovascular: Normal rate, regular rhythm, normal heart sounds and intact distal pulses.  Pulmonary/Chest: Effort normal and breath sounds normal.  Abdominal: Soft. Bowel sounds are normal. He exhibits no distension and no mass. There is no tenderness. There is no rebound and no guarding. No hernia.  Musculoskeletal:  Mildly tender in the left lower back and left flank. No CVAT. The spine is not tender and has full ROM. Negative SLR.           Assessment & Plan:  Muscle strain. This should heal with time. He can use heat, Flexeril, and Ibuprofen prn.  Alysia Penna, MD

## 2017-08-25 DIAGNOSIS — M545 Low back pain: Secondary | ICD-10-CM | POA: Diagnosis not present

## 2017-08-30 DIAGNOSIS — M545 Low back pain: Secondary | ICD-10-CM | POA: Diagnosis not present

## 2017-09-06 DIAGNOSIS — M545 Low back pain: Secondary | ICD-10-CM | POA: Diagnosis not present

## 2017-09-10 DIAGNOSIS — E291 Testicular hypofunction: Secondary | ICD-10-CM | POA: Diagnosis not present

## 2017-09-13 DIAGNOSIS — Z72820 Sleep deprivation: Secondary | ICD-10-CM | POA: Diagnosis not present

## 2017-09-13 DIAGNOSIS — R6882 Decreased libido: Secondary | ICD-10-CM | POA: Diagnosis not present

## 2017-09-13 DIAGNOSIS — M545 Low back pain: Secondary | ICD-10-CM | POA: Diagnosis not present

## 2017-09-13 DIAGNOSIS — E291 Testicular hypofunction: Secondary | ICD-10-CM | POA: Diagnosis not present

## 2017-09-21 DIAGNOSIS — E291 Testicular hypofunction: Secondary | ICD-10-CM | POA: Diagnosis not present

## 2017-09-22 DIAGNOSIS — M545 Low back pain: Secondary | ICD-10-CM | POA: Diagnosis not present

## 2017-09-27 DIAGNOSIS — M545 Low back pain: Secondary | ICD-10-CM | POA: Diagnosis not present

## 2017-09-28 DIAGNOSIS — E291 Testicular hypofunction: Secondary | ICD-10-CM | POA: Diagnosis not present

## 2017-10-04 DIAGNOSIS — M545 Low back pain: Secondary | ICD-10-CM | POA: Diagnosis not present

## 2017-10-14 DIAGNOSIS — E291 Testicular hypofunction: Secondary | ICD-10-CM | POA: Diagnosis not present

## 2017-10-14 DIAGNOSIS — R6882 Decreased libido: Secondary | ICD-10-CM | POA: Diagnosis not present

## 2017-10-14 DIAGNOSIS — Z72820 Sleep deprivation: Secondary | ICD-10-CM | POA: Diagnosis not present

## 2017-10-19 ENCOUNTER — Other Ambulatory Visit: Payer: Self-pay | Admitting: Family Medicine

## 2017-10-19 DIAGNOSIS — Z72820 Sleep deprivation: Secondary | ICD-10-CM | POA: Diagnosis not present

## 2017-10-19 DIAGNOSIS — R6882 Decreased libido: Secondary | ICD-10-CM | POA: Diagnosis not present

## 2017-10-19 DIAGNOSIS — E291 Testicular hypofunction: Secondary | ICD-10-CM | POA: Diagnosis not present

## 2017-11-01 ENCOUNTER — Other Ambulatory Visit: Payer: Self-pay | Admitting: Family Medicine

## 2017-11-02 DIAGNOSIS — R200 Anesthesia of skin: Secondary | ICD-10-CM | POA: Diagnosis not present

## 2017-11-02 DIAGNOSIS — M4722 Other spondylosis with radiculopathy, cervical region: Secondary | ICD-10-CM | POA: Diagnosis not present

## 2017-11-02 DIAGNOSIS — M5126 Other intervertebral disc displacement, lumbar region: Secondary | ICD-10-CM | POA: Diagnosis not present

## 2017-11-02 NOTE — Telephone Encounter (Signed)
Dr. Sarajane Jews please advise on refills.  Last filled 01/2017

## 2017-11-02 NOTE — Telephone Encounter (Signed)
Call in #270 with one rf 

## 2017-11-03 DIAGNOSIS — M5416 Radiculopathy, lumbar region: Secondary | ICD-10-CM | POA: Diagnosis not present

## 2017-11-03 DIAGNOSIS — M5126 Other intervertebral disc displacement, lumbar region: Secondary | ICD-10-CM | POA: Diagnosis not present

## 2017-11-16 DIAGNOSIS — E291 Testicular hypofunction: Secondary | ICD-10-CM | POA: Diagnosis not present

## 2017-11-23 DIAGNOSIS — E291 Testicular hypofunction: Secondary | ICD-10-CM | POA: Diagnosis not present

## 2017-11-23 DIAGNOSIS — N529 Male erectile dysfunction, unspecified: Secondary | ICD-10-CM | POA: Diagnosis not present

## 2017-12-20 DIAGNOSIS — E291 Testicular hypofunction: Secondary | ICD-10-CM | POA: Diagnosis not present

## 2017-12-28 ENCOUNTER — Telehealth: Payer: Self-pay | Admitting: Family Medicine

## 2017-12-28 NOTE — Telephone Encounter (Signed)
Copied from [place] [id number]. Topic: General - Other >> Dec 28, 2017 10:28 AM Yvette Rack wrote: Reason for CRM: Pt stated he received a letter from his insurance company regarding the Rx for sildenafil (VIAGRA) 100 MG tablet. Pt stated the letter states starting January 2020 there will be a quantity limit of 6 tablets every 25 days or 18 tablets every 75 days or provider can contact them directly at [phone] for approval of larger quantity. Pt requests call back. Cb# [phone]

## 2017-12-30 NOTE — Telephone Encounter (Signed)
Form placed in green folder for Dr. Sarajane Jews to review.

## 2018-01-10 ENCOUNTER — Telehealth: Payer: Self-pay | Admitting: *Deleted

## 2018-01-10 NOTE — Telephone Encounter (Signed)
A note from [facility] was given to me by Dr Sarajane Jews asking to submit info to the pts insurance for him to receive a 30 day supply of Sildenafil 100mg  every 75 days instead of #18 every 75 days (which will be required by the insurance in 2020).  Prior auth sent to [email or web address] with this request and the note was given to [facility] for follow up.

## 2018-01-17 ENCOUNTER — Other Ambulatory Visit: Payer: Self-pay | Admitting: Family Medicine

## 2018-01-19 DIAGNOSIS — J101 Influenza due to other identified influenza virus with other respiratory manifestations: Secondary | ICD-10-CM | POA: Diagnosis not present

## 2018-01-28 NOTE — Telephone Encounter (Signed)
Sorry key was not entered in initial phone [id number].

## 2018-01-28 NOTE — Telephone Encounter (Signed)
Checked status of PA. Cover My meds shows that the PA was not sent to the plan. Completed and sent today.

## 2018-01-28 NOTE — Telephone Encounter (Signed)
Unable to check the status of this PA on CoverMyMeds without key.

## 2018-02-01 NOTE — Telephone Encounter (Signed)
Checked status. Additional information was needed. This has been completed and she to cover my meds.

## 2018-02-15 ENCOUNTER — Other Ambulatory Visit: Payer: Self-pay | Admitting: Family Medicine

## 2018-02-18 NOTE — Telephone Encounter (Signed)
Checked status.  [employer] The plan will fax you a determination, typically within 1 to 5 business days.  Will look for fax.

## 2018-02-24 DIAGNOSIS — E291 Testicular hypofunction: Secondary | ICD-10-CM | POA: Diagnosis not present

## 2018-03-02 DIAGNOSIS — R6882 Decreased libido: Secondary | ICD-10-CM | POA: Diagnosis not present

## 2018-03-02 DIAGNOSIS — E291 Testicular hypofunction: Secondary | ICD-10-CM | POA: Diagnosis not present

## 2018-03-04 ENCOUNTER — Encounter: Payer: Self-pay | Admitting: *Deleted

## 2018-03-15 NOTE — Telephone Encounter (Signed)
PA has been approved

## 2018-04-05 ENCOUNTER — Encounter: Payer: Self-pay | Admitting: Internal Medicine

## 2018-04-19 ENCOUNTER — Encounter: Payer: Self-pay | Admitting: *Deleted

## 2018-04-19 NOTE — Telephone Encounter (Signed)
Dr. Fry please advise. Thanks  

## 2018-04-19 NOTE — Telephone Encounter (Signed)
Just have him come in for a regular OV

## 2018-04-20 ENCOUNTER — Other Ambulatory Visit: Payer: Self-pay

## 2018-04-20 ENCOUNTER — Encounter: Payer: Self-pay | Admitting: Family Medicine

## 2018-04-20 ENCOUNTER — Ambulatory Visit: Payer: 59 | Admitting: Family Medicine

## 2018-04-20 VITALS — BP 128/82 | HR 90 | Temp 98.6°F | Wt 201.1 lb

## 2018-04-20 DIAGNOSIS — J301 Allergic rhinitis due to pollen: Secondary | ICD-10-CM | POA: Diagnosis not present

## 2018-04-20 DIAGNOSIS — M6740 Ganglion, unspecified site: Secondary | ICD-10-CM

## 2018-04-20 MED ORDER — METHYLPREDNISOLONE ACETATE 80 MG/ML IJ SUSP
120.0000 mg | Freq: Once | INTRAMUSCULAR | Status: AC
  Administered 2018-04-20: 120 mg via INTRAMUSCULAR

## 2018-04-20 NOTE — Progress Notes (Signed)
   Subjective:    Patient ID: Dale [name], male    DOB: 1960/05/16, 58 y.o.   MRN: [id number]  HPI Here for a flare of his seasonal allergies, causing stuffy head, itchy eyes, PND, and sneezing. Using Claritin D and Flonase. No cough or fever or SOB. He also asks about a lump he noticed on the right hand 2 weeks ago. This does not bother him at all.    Review of Systems  Constitutional: Negative.   HENT: Positive for congestion, postnasal drip, rhinorrhea and sneezing. Negative for sinus pressure, sinus pain and sore throat.   Eyes: Positive for itching. Negative for discharge and redness.  Respiratory: Negative.        Objective:   Physical Exam Constitutional:      General: He is not in acute distress. HENT:     Right Ear: Tympanic membrane and ear canal normal.     Left Ear: Tympanic membrane and ear canal normal.     Nose: Nose normal.     Mouth/Throat:     Pharynx: Oropharynx is clear.  Eyes:     Conjunctiva/sclera: Conjunctivae normal.  Pulmonary:     Effort: Pulmonary effort is normal.     Breath sounds: Normal breath sounds.  Musculoskeletal:     Comments: The dorsal right hand has a small mobile smooth non-tender cyst along the 4th extensor tendon   Lymphadenopathy:     Cervical: No cervical adenopathy.  Neurological:     Mental Status: He is alert.           Assessment & Plan:  Allergies, given a shot of DepoMedrol. He also has a small ganglion cyst on the hand. He will simply observe this for now. Recheck prn. Alysia Penna, MD

## 2018-05-21 ENCOUNTER — Other Ambulatory Visit: Payer: Self-pay | Admitting: Physician Assistant

## 2018-05-30 ENCOUNTER — Telehealth: Payer: Self-pay | Admitting: Family Medicine

## 2018-05-30 NOTE — Telephone Encounter (Signed)
Dr. Sarajane Jews please advise if the pt will need another depo injection?  Thanks

## 2018-05-30 NOTE — Telephone Encounter (Signed)
Yes please set up another nurse visit for DepoMedrol 120 mg shot

## 2018-05-30 NOTE — Telephone Encounter (Signed)
Patient received a Depo-Medrol injection on 04/20/18.  He states he is supposed to get another injection in 6 weeks so he wants to schedule an appointment for an OV to get this injection.

## 2018-06-01 NOTE — Telephone Encounter (Signed)
Patient called today needing to schedule his appointment for his shot. He said that the nurse told him that she will call him back and he still hasn't heard anything. I scheduled him for Friday Jun 03, 2018 at 12 PM on the nurse schedule.

## 2018-06-03 ENCOUNTER — Other Ambulatory Visit: Payer: Self-pay

## 2018-06-03 ENCOUNTER — Ambulatory Visit (INDEPENDENT_AMBULATORY_CARE_PROVIDER_SITE_OTHER): Payer: 59 | Admitting: *Deleted

## 2018-06-03 DIAGNOSIS — J301 Allergic rhinitis due to pollen: Secondary | ICD-10-CM | POA: Diagnosis not present

## 2018-06-03 MED ORDER — METHYLPREDNISOLONE ACETATE 40 MG/ML IJ SUSP
40.0000 mg | Freq: Once | INTRAMUSCULAR | Status: AC
  Administered 2018-06-03: 40 mg via INTRAMUSCULAR

## 2018-06-03 MED ORDER — METHYLPREDNISOLONE ACETATE 80 MG/ML IJ SUSP
80.0000 mg | Freq: Once | INTRAMUSCULAR | Status: AC
  Administered 2018-06-03: 80 mg via INTRAMUSCULAR

## 2018-06-09 ENCOUNTER — Encounter: Payer: Self-pay | Admitting: Internal Medicine

## 2018-06-13 NOTE — Progress Notes (Signed)
Done

## 2018-06-16 ENCOUNTER — Other Ambulatory Visit: Payer: Self-pay | Admitting: Family Medicine

## 2018-06-24 ENCOUNTER — Other Ambulatory Visit: Payer: Self-pay

## 2018-06-24 ENCOUNTER — Ambulatory Visit (AMBULATORY_SURGERY_CENTER): Payer: Self-pay | Admitting: *Deleted

## 2018-06-24 ENCOUNTER — Encounter: Payer: Self-pay | Admitting: Internal Medicine

## 2018-06-24 VITALS — Ht 71.0 in | Wt 190.0 lb

## 2018-06-24 DIAGNOSIS — Z86010 Personal history of colonic polyps: Secondary | ICD-10-CM

## 2018-06-24 MED ORDER — NA SULFATE-K SULFATE-MG SULF 17.5-3.13-1.6 GM/177ML PO SOLN: ORAL | 0 refills | Status: DC

## 2018-06-24 NOTE — Progress Notes (Signed)
Patient's pre-visit was done today over the phone with the patient due to Covid-19. Name,DOB and address verified. Insurance verified. Packet of Prep instructions mailed to patient including copy of a consent form and pre-procedure patient acknowledgement form- and Suprep coupon-pt is aware. Patient understands to call us back with any questions or concerns.   Patient denies any allergies to eggs or soy. Patient denies any problems with anesthesia/sedation. Patient denies any oxygen use at home. Patient denies taking any diet/weight loss medications or blood thinners. EMMI education assisgned to patient on colonoscopy, this was explained and instructions given to patient. 

## 2018-06-29 ENCOUNTER — Telehealth: Payer: Self-pay | Admitting: *Deleted

## 2018-06-29 NOTE — Telephone Encounter (Signed)

## 2018-07-01 ENCOUNTER — Other Ambulatory Visit: Payer: Self-pay

## 2018-07-01 ENCOUNTER — Encounter: Payer: 59 | Admitting: Internal Medicine

## 2018-07-01 ENCOUNTER — Ambulatory Visit (AMBULATORY_SURGERY_CENTER): Payer: 59 | Admitting: Internal Medicine

## 2018-07-01 ENCOUNTER — Encounter: Payer: Self-pay | Admitting: Internal Medicine

## 2018-07-01 VITALS — BP 127/73 | HR 68 | Temp 97.5°F | Resp 13 | Ht 71.0 in | Wt 190.0 lb

## 2018-07-01 DIAGNOSIS — D120 Benign neoplasm of cecum: Secondary | ICD-10-CM

## 2018-07-01 DIAGNOSIS — Z86010 Personal history of colonic polyps: Secondary | ICD-10-CM

## 2018-07-01 HISTORY — PX: COLONOSCOPY: SHX174

## 2018-07-01 MED ORDER — SODIUM CHLORIDE 0.9 % IV SOLN: 500.0000 mL | Freq: Once | INTRAVENOUS | Status: DC

## 2018-07-01 NOTE — Progress Notes (Signed)
Called to room to assist during endoscopic procedure.  Patient ID and intended procedure confirmed with present staff. Received instructions for my participation in the procedure from the performing physician.  

## 2018-07-01 NOTE — Op Note (Signed)
[facility] Patient Name: Dale [name] Procedure Date: 07/01/2018 8:58 AM MRN: [id number] Endoscopist: Jerene Bears , MD Age: 58 Referring MD:  Date of Birth: May 03, 1960 Gender: Male Account #: [id number] Procedure:                Colonoscopy Indications:              High risk colon cancer surveillance: Personal                            history of non-advanced adenoma, Last colonoscopy 5                            years ago Medicines:                Monitored Anesthesia Care Procedure:                Pre-Anesthesia Assessment:                           - Prior to the procedure, a History and Physical                            was performed, and patient medications and                            allergies were reviewed. The patient's tolerance of                            previous anesthesia was also reviewed. The risks                            and benefits of the procedure and the sedation                            options and risks were discussed with the patient.                            All questions were answered, and informed consent                            was obtained. Prior Anticoagulants: The patient has                            taken no previous anticoagulant or antiplatelet                            agents. ASA Grade Assessment: II - A patient with                            mild systemic disease. After reviewing the risks                            and benefits, the patient was deemed in  satisfactory condition to undergo the procedure.                           After obtaining informed consent, the colonoscope                            was passed under direct vision. Throughout the                            procedure, the patient's blood pressure, pulse, and                            oxygen saturations were monitored continuously. The                            Model CF-HQ190L [id number]) scope was introduced                      through the anus and advanced to the cecum,                            identified by appendiceal orifice and ileocecal                            valve. The colonoscopy was performed without                            difficulty. The patient tolerated the procedure                            well. The quality of the bowel preparation was                            good. The ileocecal valve, appendiceal orifice, and                            rectum were photographed. Scope In: 9:15:46 AM Scope Out: 9:25:50 AM Scope Withdrawal Time: 0 hours 8 minutes 11 seconds  Total Procedure Duration: 0 hours 10 minutes 4 seconds  Findings:                 The digital rectal exam was normal.                           A 4 mm polyp was found in the cecum. The polyp was                            sessile. The polyp was removed with a cold snare.                            Resection and retrieval were complete.                           Multiple small-mouthed diverticula were found in  the sigmoid colon, descending colon and ascending                            colon.                           The retroflexed view of the distal rectum and anal                            verge was normal and showed no anal or rectal                            abnormalities. Complications:            No immediate complications. Estimated Blood Loss:     Estimated blood loss was minimal. Impression:               - One 4 mm polyp in the cecum, removed with a cold                            snare. Resected and retrieved.                           - Diverticulosis in the sigmoid colon, in the                            descending colon and in the ascending colon.                           - The distal rectum and anal verge are normal on                            retroflexion view. Recommendation:           - Patient has a contact number available for                             emergencies. The signs and symptoms of potential                            delayed complications were discussed with the                            patient. Return to normal activities tomorrow.                            Written discharge instructions were provided to the                            patient.                           - Resume previous diet.                           - Continue present medications.                           -  Await pathology results.                           - Repeat colonoscopy is recommended for                            surveillance. The colonoscopy date will be                            determined after pathology results from today's                            exam become available for review. Jerene Bears, MD 07/01/2018 9:28:28 AM This report has been signed electronically.

## 2018-07-01 NOTE — Progress Notes (Signed)
Nancy Campbell, LPN- Temp Judy Branson, CMA- [facility] 

## 2018-07-01 NOTE — Progress Notes (Signed)
To PACU, VSS. Report to [email or web address].tb 

## 2018-07-01 NOTE — Patient Instructions (Signed)
1 polyp removed today.  Wait for pathology and letter in the mail. Diverticulosis noted.  Per MD only 5% may develop diverticulitis.    YOU HAD AN ENDOSCOPIC PROCEDURE TODAY AT [facility]:   Refer to the procedure report that was given to you for any specific questions about what was found during the examination.  If the procedure report does not answer your questions, please call your gastroenterologist to clarify.  If you requested that your care partner not be given the details of your procedure findings, then the procedure report has been included in a sealed envelope for you to review at your convenience later.  YOU SHOULD EXPECT: Some feelings of bloating in the abdomen. Passage of more gas than usual.  Walking can help get rid of the air that was put into your GI tract during the procedure and reduce the bloating. If you had a lower endoscopy (such as a colonoscopy or flexible sigmoidoscopy) you may notice spotting of blood in your stool or on the toilet paper. If you underwent a bowel prep for your procedure, you may not have a normal bowel movement for a few days.  Please Note:  You might notice some irritation and congestion in your nose or some drainage.  This is from the oxygen used during your procedure.  There is no need for concern and it should clear up in a day or so.  SYMPTOMS TO REPORT IMMEDIATELY:   Following lower endoscopy (colonoscopy or flexible sigmoidoscopy):  Excessive amounts of blood in the stool  Significant tenderness or worsening of abdominal pains  Swelling of the abdomen that is new, acute  Fever of 100F or higher   For urgent or emergent issues, a gastroenterologist can be reached at any hour by calling [phone].   DIET:  We do recommend a small meal at first, but then you may proceed to your regular diet.  Drink plenty of fluids but you should avoid alcoholic beverages for 24 hours.  ACTIVITY:  You should plan to take it easy for  the rest of today and you should NOT DRIVE or use heavy machinery until tomorrow (because of the sedation medicines used during the test).    FOLLOW UP: Our staff will call the number listed on your records 48-72 hours following your procedure to check on you and address any questions or concerns that you may have regarding the information given to you following your procedure. If we do not reach you, we will leave a message.  We will attempt to reach you two times.  During this call, we will ask if you have developed any symptoms of COVID 19. If you develop any symptoms (ie: fever, flu-like symptoms, shortness of breath, cough etc.) before then, please call [phone].  If you test positive for Covid 19 in the 2 weeks post procedure, please call and report this information to [place].    If any biopsies were taken you will be contacted by phone or by letter within the next 1-3 weeks.  Please call us at [phone] if you have not heard about the biopsies in 3 weeks.    SIGNATURES/CONFIDENTIALITY: You and/or your care partner have signed paperwork which will be entered into your electronic medical record.  These signatures attest to the fact that that the information above on your After Visit Summary has been reviewed and is understood.  Full responsibility of the confidentiality of this discharge information lies with you and/or your care-partner.

## 2018-07-01 NOTE — Progress Notes (Signed)
Pt's states no medical or surgical changes since previsit or office visit. 

## 2018-07-02 ENCOUNTER — Other Ambulatory Visit: Payer: Self-pay | Admitting: Family Medicine

## 2018-07-05 ENCOUNTER — Other Ambulatory Visit: Payer: Self-pay | Admitting: *Deleted

## 2018-07-05 ENCOUNTER — Telehealth: Payer: Self-pay | Admitting: *Deleted

## 2018-07-05 ENCOUNTER — Telehealth: Payer: Self-pay | Admitting: Family Medicine

## 2018-07-05 ENCOUNTER — Telehealth: Payer: Self-pay

## 2018-07-05 NOTE — Telephone Encounter (Signed)
Pt needs refill on sildenafil (VIAGRA) 100 MG tablet  And valACYclovir (VALTREX) 500 MG tablet  Please send to [facility]   Also, pt needs refill on  LORazepam (ATIVAN) 2 MG tablet  Please send that one to [facility] [address]  Pt number is [phone] Thanks

## 2018-07-05 NOTE — Telephone Encounter (Signed)
No answer, left message to call back later today, B.Mikhi Athey RN. 

## 2018-07-05 NOTE — Telephone Encounter (Signed)
Please advise on refill of meds for the pt. Thanks

## 2018-07-05 NOTE — Telephone Encounter (Signed)
  Follow up Call-  Call back number 07/01/2018 05/28/2017  Post procedure Call Back phone  # [phone]  Permission to leave phone message Yes Yes  Some recent data might be hidden     Patient questions:  Do you have a fever, pain , or abdominal swelling? No. Pain Score  0 *  Have you tolerated food without any problems? Yes.    Have you been able to return to your normal activities? Yes.    Do you have any questions about your discharge instructions: Diet   No. Medications  NO. Follow up visit  No.  Do you have questions or concerns about your Care? No.  Actions: * If pain score is 4 or above: No action needed, pain <4.  1. Have you developed a fever since your procedure? NO  2.   Have you had an respiratory symptoms (SOB or cough) since your procedure? NO  3.   Have you tested positive for COVID 19 since your procedure NO  4.   Have you had any family members/close contacts diagnosed with the COVID 19 since your procedure?  NO   If yes to any of these questions please route to Joylene John, RN and Alphonsa Gin, RN.

## 2018-07-06 MED ORDER — VALACYCLOVIR HCL 500 MG PO TABS: ORAL_TABLET | ORAL | 3 refills | Status: DC

## 2018-07-06 MED ORDER — SILDENAFIL CITRATE 100 MG PO TABS: ORAL_TABLET | ORAL | 5 refills | Status: DC

## 2018-07-06 MED ORDER — LORAZEPAM 2 MG PO TABS: 2.0000 mg | ORAL_TABLET | Freq: Four times a day (QID) | ORAL | 1 refills | Status: DC | PRN

## 2018-07-06 NOTE — Telephone Encounter (Signed)
I sent in all 3  meds

## 2018-07-07 ENCOUNTER — Encounter: Payer: Self-pay | Admitting: Internal Medicine

## 2018-12-28 ENCOUNTER — Other Ambulatory Visit: Payer: Self-pay | Admitting: Family Medicine

## 2019-01-24 ENCOUNTER — Telehealth: Payer: Self-pay

## 2019-01-24 ENCOUNTER — Ambulatory Visit: Payer: 59 | Attending: Internal Medicine

## 2019-01-24 DIAGNOSIS — Z20822 Contact with and (suspected) exposure to covid-19: Secondary | ICD-10-CM

## 2019-01-24 NOTE — Telephone Encounter (Signed)
Copied from [employer] [phone]. Topic: General - Other >> Jan 24, 2019 10:28 AM Keene Breath wrote: Reason for CRM: Patient would like the nurse to call him regarding some symptoms.  CB# [phone]

## 2019-01-24 NOTE — Telephone Encounter (Signed)
Nurse from worked asked the patient to contact his PCP. The patient has had Covid symptoms and his brother, brothers girlfriend and a family friend all tested positive. Patient was tested Thursday and Saturday all test were negative. Patient is still having a low grade fever, chills , body aches, cough and congestion.  Patient has been scheduled for a re-test this afternoon at 3.

## 2019-01-25 ENCOUNTER — Telehealth (INDEPENDENT_AMBULATORY_CARE_PROVIDER_SITE_OTHER): Payer: 59 | Admitting: Family Medicine

## 2019-01-25 ENCOUNTER — Other Ambulatory Visit: Payer: Self-pay

## 2019-01-25 DIAGNOSIS — R519 Headache, unspecified: Secondary | ICD-10-CM | POA: Diagnosis not present

## 2019-01-25 DIAGNOSIS — R509 Fever, unspecified: Secondary | ICD-10-CM

## 2019-01-25 DIAGNOSIS — R110 Nausea: Secondary | ICD-10-CM

## 2019-01-25 DIAGNOSIS — M791 Myalgia, unspecified site: Secondary | ICD-10-CM | POA: Diagnosis not present

## 2019-01-25 DIAGNOSIS — R6889 Other general symptoms and signs: Secondary | ICD-10-CM

## 2019-01-25 DIAGNOSIS — Z20828 Contact with and (suspected) exposure to other viral communicable diseases: Secondary | ICD-10-CM

## 2019-01-25 NOTE — Progress Notes (Signed)
Virtual Visit via Video Note  I connected with the patient on 01/25/19 at  1:00 PM EST by a video enabled telemedicine application and verified that I am speaking with the correct person using two identifiers.  Location patient: home Location provider:work or home office Persons participating in the virtual visit: patient, provider  I discussed the limitations of evaluation and management by telemedicine and the availability of in person appointments. The patient expressed understanding and agreed to proceed.   HPI: Here asking questions about flu-like symptoms. He spent several days around Christmas tme with his brother, his brother's girlfriend, and another friend. Then last week they all developed similar symptoms. Tarel has had body aches, chills, fever to 99.5 degrees, headaches, and nausea. No cough or SOB. All four of these people got tested for the Covid-19 virus last week, and the other 3 people all tested positive. Joh tested negative. He continued to work last week, and over the weekend he started to feel better. Then yesterday he developed aches and chills again, and the company nurse todl him to get tested again and to go home. He did get tested yesterday, and the result is still pending. Today he feels a little better. He is drinking fluids and taking Ibuprofen.    ROS: See pertinent positives and negatives per HPI.  Past Medical History:  Diagnosis Date  . Allergy   . Anxiety   . Arthritis    "SOME IN BACK"  . Chest pain, non-cardiac    normal stress test on 08-26-09   . Depression   . GERD (gastroesophageal reflux disease)    TAKES MED  . History of nephrolithiasis   . Hyperlipidemia     Past Surgical History:  Procedure Laterality Date  . BACK SURGERY    . COLONOSCOPY  05-04-13   per Dr. Hilarie Fredrickson, adenomatous polyps, repeat in 5 yrs   . JOINT REPLACEMENT    . KNEE ARTHROSCOPY Right 01/09/2013   Procedure: ARTHROSCOPY KNEE;  Surgeon: Marybelle Killings, MD;  Location: [facility];  Service: Orthopedics;  Laterality: Right;  Right knee scope, partial medial menisectomy  . KNEE SURGERY  83, 89, 90, 01, 02,14   rt knee x 6  . KNEE SURGERY  2007   left knee meniscus repair  . POSTERIOR LAMINECTOMY / DECOMPRESSION LUMBAR SPINE  12-21-11   per Dr. Earle Gell   . ROTATOR CUFF REPAIR  2003  . SPINE SURGERY    . TOTAL KNEE ARTHROPLASTY Right 07/10/2013   Procedure: RIGHT TOTAL KNEE ARTHROPLASTY;  Surgeon: Gearlean Alf, MD;  Location: [facility];  Service: Orthopedics;  Laterality: Right;  Marland Kitchen VASECTOMY      Family History  Problem Relation Age of Onset  . Heart attack Father   . Coronary artery disease Father   . Heart disease Father   . Depression Other        family hx  . Hyperlipidemia Other        family hx  . Hypertension Other        family hx  . Lung cancer Other        family hx  . Stroke Other        family hx  . Hypertension Mother   . Hyperlipidemia Mother   . Colon cancer Neg Hx   . Esophageal cancer Neg Hx   . Rectal cancer Neg Hx   . Stomach cancer Neg Hx   . Colon polyps Neg Hx  Current Outpatient Medications:  .  5-Hydroxytryptophan (5-HTP PO), Take by mouth., Disp: , Rfl:  .  amLODipine (NORVASC) 5 MG tablet, TAKE 1 TABLET DAILY        (SCHEDULE A PHYSICAL FOR   MORE REFILLS), Disp: 90 tablet, Rfl: 3 .  aspirin 81 MG tablet, Take 81 mg by mouth every other day., Disp: , Rfl:  .  atorvastatin (LIPITOR) 10 MG tablet, TAKE 1 TABLET EVERY MORNING, Disp: 90 tablet, Rfl: 3 .  famotidine (PEPCID) 40 MG tablet, TAKE 1 TABLET BY MOUTH TWICE A DAY, Disp: 180 tablet, Rfl: 1 .  loratadine-pseudoephedrine (CLARITIN-D 12 HOUR) 5-120 MG tablet, Take 1 tablet by mouth 2 (two) times daily as needed for allergies. (Patient taking differently: Take 1 tablet by mouth daily as needed for allergies. ), Disp: 2 tablet, Rfl: 0 .  LORazepam (ATIVAN) 2 MG tablet, Take 1 tablet (2 mg total) by mouth every 6 (six) hours as needed., Disp: 270 tablet, Rfl: 1 .   Multiple Vitamin (ONE-A-DAY MENS PO), Take by mouth., Disp: , Rfl:  .  oxyCODONE-acetaminophen (PERCOCET/ROXICET) 5-325 MG tablet, , Disp: , Rfl:  .  sildenafil (VIAGRA) 100 MG tablet, TAKE 1 TABLET (100 MG TOTAL) BY MOUTH DAILY AS NEEDED FOR ERECTILE DYSFUNCTION., Disp: 30 tablet, Rfl: 5 .  valACYclovir (VALTREX) 500 MG tablet, TAKE 1 TABLET DAILY, Disp: 90 tablet, Rfl: 3  EXAM:  VITALS per patient if applicable:  GENERAL: alert, oriented, appears well and in no acute distress  HEENT: atraumatic, conjunttiva clear, no obvious abnormalities on inspection of external nose and ears  NECK: normal movements of the head and neck  LUNGS: on inspection no signs of respiratory distress, breathing rate appears normal, no obvious gross SOB, gasping or wheezing  CV: no obvious cyanosis  MS: moves all visible extremities without noticeable abnormality  PSYCH/NEURO: pleasant and cooperative, no obvious depression or anxiety, speech and thought processing grossly intact  ASSESSMENT AND PLAN: He has flu-like symptoms and he very likely has a Covid infection. He will remain quarantined while we await his latest test result. Recheck at that point.  Alysia Penna, MD  Discussed the following assessment and plan:  No diagnosis found.     I discussed the assessment and treatment plan with the patient. The patient was provided an opportunity to ask questions and all were answered. The patient agreed with the plan and demonstrated an understanding of the instructions.   The patient was advised to call back or seek an in-person evaluation if the symptoms worsen or if the condition fails to improve as anticipated.

## 2019-01-25 NOTE — Telephone Encounter (Signed)
Set up a virtual visit with me to discuss

## 2019-01-25 NOTE — Telephone Encounter (Signed)
Appointment has been made. nothing further needed.

## 2019-01-26 ENCOUNTER — Encounter: Payer: Self-pay | Admitting: Family Medicine

## 2019-01-26 LAB — NOVEL CORONAVIRUS, NAA: SARS-CoV-2, NAA: NOT DETECTED

## 2019-01-29 NOTE — Telephone Encounter (Signed)
Yes please send an updated letter for work

## 2019-02-04 ENCOUNTER — Other Ambulatory Visit: Payer: Self-pay | Admitting: Internal Medicine

## 2019-04-20 ENCOUNTER — Other Ambulatory Visit: Payer: Self-pay

## 2019-04-24 ENCOUNTER — Other Ambulatory Visit: Payer: Self-pay

## 2019-04-24 ENCOUNTER — Encounter: Payer: Self-pay | Admitting: Family Medicine

## 2019-04-24 ENCOUNTER — Ambulatory Visit (INDEPENDENT_AMBULATORY_CARE_PROVIDER_SITE_OTHER): Payer: 59 | Admitting: Family Medicine

## 2019-04-24 DIAGNOSIS — J301 Allergic rhinitis due to pollen: Secondary | ICD-10-CM

## 2019-04-24 MED ORDER — METHYLPREDNISOLONE ACETATE 40 MG/ML IJ SUSP
40.0000 mg | Freq: Once | INTRAMUSCULAR | Status: AC
  Administered 2019-04-24: 40 mg via INTRAMUSCULAR

## 2019-04-24 MED ORDER — METHYLPREDNISOLONE ACETATE 80 MG/ML IJ SUSP
80.0000 mg | Freq: Once | INTRAMUSCULAR | Status: AC
  Administered 2019-04-24: 80 mg via INTRAMUSCULAR

## 2019-04-24 NOTE — Progress Notes (Signed)
   Subjective:    Patient ID: Dale [name], male    DOB: 10-26-1960, 59 y.o.   MRN: [id number]  HPI Here for his seasonal allergy flare up. He has stuffy head, snaeezing, and PND. No fever or cough.    Review of Systems  Constitutional: Negative.   HENT: Positive for congestion, postnasal drip and sneezing. Negative for ear pain.   Eyes: Negative.   Respiratory: Negative.   Cardiovascular: Negative.        Objective:   Physical Exam Constitutional:      Appearance: Normal appearance.  HENT:     Right Ear: Tympanic membrane, ear canal and external ear normal.     Left Ear: Tympanic membrane, ear canal and external ear normal.     Nose: Nose normal.     Mouth/Throat:     Pharynx: Oropharynx is clear.  Eyes:     Conjunctiva/sclera: Conjunctivae normal.  Cardiovascular:     Rate and Rhythm: Normal rate and regular rhythm.     Pulses: Normal pulses.     Heart sounds: Normal heart sounds.  Pulmonary:     Effort: Pulmonary effort is normal.     Breath sounds: Normal breath sounds.  Lymphadenopathy:     Cervical: No cervical adenopathy.  Neurological:     Mental Status: He is alert.           Assessment & Plan:  Allergies, given a DepoMedrol shot.'  Alysia Penna, MD

## 2019-04-24 NOTE — Addendum Note (Signed)
Addended by: Rebecca Eaton on: 04/24/2019 04:24 PM   Modules accepted: Orders

## 2019-04-26 ENCOUNTER — Other Ambulatory Visit: Payer: Self-pay | Admitting: Family Medicine

## 2019-04-26 ENCOUNTER — Other Ambulatory Visit: Payer: Self-pay | Admitting: Internal Medicine

## 2019-04-28 NOTE — Telephone Encounter (Signed)
Last filled 07/06/2018 Last OV 04/24/2019  Ok to fill?

## 2019-06-02 ENCOUNTER — Other Ambulatory Visit: Payer: Self-pay

## 2019-06-05 ENCOUNTER — Encounter: Payer: Self-pay | Admitting: Family Medicine

## 2019-06-05 ENCOUNTER — Ambulatory Visit: Payer: 59 | Admitting: Family Medicine

## 2019-06-05 ENCOUNTER — Other Ambulatory Visit: Payer: Self-pay

## 2019-06-05 VITALS — BP 140/80 | HR 94 | Temp 98.2°F | Wt 198.0 lb

## 2019-06-05 DIAGNOSIS — R3129 Other microscopic hematuria: Secondary | ICD-10-CM

## 2019-06-05 DIAGNOSIS — R300 Dysuria: Secondary | ICD-10-CM | POA: Diagnosis not present

## 2019-06-05 DIAGNOSIS — B351 Tinea unguium: Secondary | ICD-10-CM

## 2019-06-05 DIAGNOSIS — J301 Allergic rhinitis due to pollen: Secondary | ICD-10-CM

## 2019-06-05 LAB — POCT URINALYSIS DIPSTICK
Bilirubin, UA: NEGATIVE
Glucose, UA: NEGATIVE
Ketones, UA: NEGATIVE
Leukocytes, UA: NEGATIVE
Nitrite, UA: NEGATIVE
Protein, UA: NEGATIVE
Spec Grav, UA: 1.015 (ref 1.010–1.025)
Urobilinogen, UA: 0.2 E.U./dL
pH, UA: 6 (ref 5.0–8.0)

## 2019-06-05 MED ORDER — TERBINAFINE HCL 250 MG PO TABS: 250.0000 mg | ORAL_TABLET | Freq: Every day | ORAL | 1 refills | Status: DC

## 2019-06-05 MED ORDER — METHYLPREDNISOLONE ACETATE 40 MG/ML IJ SUSP
40.0000 mg | Freq: Once | INTRAMUSCULAR | Status: AC
  Administered 2019-06-05: 40 mg via INTRAMUSCULAR

## 2019-06-05 MED ORDER — METHYLPREDNISOLONE ACETATE 80 MG/ML IJ SUSP
80.0000 mg | Freq: Once | INTRAMUSCULAR | Status: AC
  Administered 2019-06-05: 80 mg via INTRAMUSCULAR

## 2019-06-05 NOTE — Progress Notes (Signed)
   Subjective:    Patient ID: Dale [name], male    DOB: 1960-05-29, 59 y.o.   MRN: [id number]  HPI Here for several issues. First his pollen allergies have flared again causing itchy eyes, runny nose, PND, and a dry cough. Second he has felt a mild burning sensation in the urethra off and off for a month. No visible blood or DC. No urgency. He has a hx of hematuria at times, and in 2014 Dr. 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 Dale [name] drinks plenty of water every day and he takes an 81 mg ASA every other day. Third he has had toenail fungus for years and he asks how to treat it. He used Sporanox some years ago with little success.    Review of Systems  Constitutional: Negative.   HENT: Positive for congestion, postnasal drip, rhinorrhea and sneezing.   Eyes: Positive for itching.  Respiratory: Positive for cough.   Cardiovascular: Negative.   Genitourinary: Positive for dysuria and hematuria. Negative for discharge, flank pain, frequency, testicular pain and urgency.       Objective:   Physical Exam Constitutional:      Appearance: Normal appearance.  HENT:     Right Ear: Tympanic membrane, ear canal and external ear normal.     Left Ear: Tympanic membrane, ear canal and external ear normal.     Nose: Nose normal.     Mouth/Throat:     Pharynx: Oropharynx is clear.  Eyes:     Conjunctiva/sclera: Conjunctivae normal.  Cardiovascular:     Rate and Rhythm: Normal rate and regular rhythm.     Pulses: Normal pulses.     Heart sounds: Normal heart sounds.  Pulmonary:     Effort: Pulmonary effort is normal. No respiratory distress.     Breath sounds: Normal breath sounds. No stridor. No wheezing, rhonchi or rales.  Lymphadenopathy:     Cervical: No cervical adenopathy.  Neurological:     Mental Status: He is alert.           Assessment & Plan:  For his allergic rhinitis he is given a shot of DepoMedrol. 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 Drink lots of water. No evidence of infection. I advised him to stop the ASA. Lastly, treat the onychomycosis with Terbinafine for 3-6 months.  Alysia Penna, MD

## 2019-06-09 ENCOUNTER — Other Ambulatory Visit: Payer: Self-pay | Admitting: Family Medicine

## 2019-07-28 ENCOUNTER — Other Ambulatory Visit: Payer: Self-pay | Admitting: Internal Medicine

## 2019-08-03 ENCOUNTER — Other Ambulatory Visit: Payer: Self-pay | Admitting: Family Medicine

## 2019-08-18 ENCOUNTER — Other Ambulatory Visit: Payer: Self-pay

## 2019-08-18 ENCOUNTER — Encounter (HOSPITAL_BASED_OUTPATIENT_CLINIC_OR_DEPARTMENT_OTHER): Payer: Self-pay | Admitting: Urology

## 2019-08-18 ENCOUNTER — Other Ambulatory Visit: Payer: Self-pay | Admitting: Urology

## 2019-08-18 NOTE — Progress Notes (Signed)
Spoke w/ via phone for pre-op interview--- PT Lab needs dos----  Istat and EKG             Lab results------ no COVID test ------ 08-19-2019 @ 0935 Arrive at ------- 0715 NPO after MN NO Solid Food.  Clear liquids from MN until--- 0615 then nothing by mouth Medications to take morning of surgery ----- Prilosec, Norvasc w/ sips of water Diabetic medication ----- n/a Patient Special Instructions ----- n/a Pre-Op special Istructions ----- n/a Patient verbalized understanding of instructions that were given at this phone interview. Patient denies shortness of breath, chest pain, fever, cough at this phone interview.

## 2019-08-19 ENCOUNTER — Other Ambulatory Visit (HOSPITAL_COMMUNITY)
Admission: RE | Admit: 2019-08-19 | Discharge: 2019-08-19 | Disposition: A | Discharge status: Home / Self Care | Payer: 59 | Source: Ambulatory Visit | Attending: Urology | Admitting: Urology

## 2019-08-19 DIAGNOSIS — Z01812 Encounter for preprocedural laboratory examination: Secondary | ICD-10-CM | POA: Diagnosis not present

## 2019-08-19 DIAGNOSIS — Z20822 Contact with and (suspected) exposure to covid-19: Secondary | ICD-10-CM | POA: Diagnosis not present

## 2019-08-19 LAB — SARS CORONAVIRUS 2 (TAT 6-24 HRS): SARS Coronavirus 2: NEGATIVE

## 2019-08-22 ENCOUNTER — Encounter (HOSPITAL_BASED_OUTPATIENT_CLINIC_OR_DEPARTMENT_OTHER): Payer: Self-pay | Admitting: Urology

## 2019-08-22 NOTE — Anesthesia Preprocedure Evaluation (Addendum)
Anesthesia Evaluation  Patient identified by MRN, date of birth, ID band Patient awake    Reviewed: Allergy & Precautions, NPO status , Patient's Chart, lab work & pertinent test results, reviewed documented beta blocker date and time   Airway Mallampati: III  TM Distance: >3 FB Neck ROM: Full    Dental  (+) Teeth Intact   Pulmonary neg pulmonary ROS,    Pulmonary exam normal breath sounds clear to auscultation       Cardiovascular hypertension, Pt. on medications Normal cardiovascular exam Rhythm:Regular Rate:Normal     Neuro/Psych PSYCHIATRIC DISORDERS Anxiety Depression negative neurological ROS     GI/Hepatic Neg liver ROS, hiatal hernia, GERD  Medicated and Controlled,  Endo/Other  Hyperlipidemia  Renal/GU Hx/o renal calculi   Prostate urethral lesion Gross hematuria    Musculoskeletal  (+) Arthritis , Osteoarthritis,    Abdominal   Peds  Hematology negative hematology ROS (+)   Anesthesia Other Findings   Reproductive/Obstetrics HSV                            Anesthesia Physical Anesthesia Plan  ASA: II  Anesthesia Plan: General   Post-op Pain Management:    Induction: Intravenous  PONV Risk Score and Plan: 4 or greater and Ondansetron, Midazolam, Dexamethasone and Treatment may vary due to age or medical condition  Airway Management Planned: LMA  Additional Equipment:   Intra-op Plan:   Post-operative Plan: Extubation in OR  Informed Consent: I have reviewed the patients History and Physical, chart, labs and discussed the procedure including the risks, benefits and alternatives for the proposed anesthesia with the patient or authorized representative who has indicated his/her understanding and acceptance.     Dental advisory given  Plan Discussed with: CRNA and Anesthesiologist  Anesthesia Plan Comments:        Anesthesia Quick Evaluation

## 2019-08-23 ENCOUNTER — Ambulatory Visit (HOSPITAL_BASED_OUTPATIENT_CLINIC_OR_DEPARTMENT_OTHER): Payer: 59 | Admitting: Anesthesiology

## 2019-08-23 ENCOUNTER — Encounter (HOSPITAL_BASED_OUTPATIENT_CLINIC_OR_DEPARTMENT_OTHER)
Admission: RE | Disposition: A | Discharge status: Home / Self Care | Payer: Self-pay | Source: Home / Self Care | Attending: Urology

## 2019-08-23 ENCOUNTER — Ambulatory Visit (HOSPITAL_BASED_OUTPATIENT_CLINIC_OR_DEPARTMENT_OTHER)
Admission: RE | Admit: 2019-08-23 | Discharge: 2019-08-23 | Disposition: A | Discharge status: Home / Self Care | Payer: 59 | Attending: Urology | Admitting: Urology

## 2019-08-23 ENCOUNTER — Encounter (HOSPITAL_BASED_OUTPATIENT_CLINIC_OR_DEPARTMENT_OTHER): Payer: Self-pay | Admitting: Urology

## 2019-08-23 DIAGNOSIS — M1990 Unspecified osteoarthritis, unspecified site: Secondary | ICD-10-CM | POA: Diagnosis not present

## 2019-08-23 DIAGNOSIS — Z79899 Other long term (current) drug therapy: Secondary | ICD-10-CM | POA: Insufficient documentation

## 2019-08-23 DIAGNOSIS — Z818 Family history of other mental and behavioral disorders: Secondary | ICD-10-CM | POA: Diagnosis not present

## 2019-08-23 DIAGNOSIS — Z8249 Family history of ischemic heart disease and other diseases of the circulatory system: Secondary | ICD-10-CM | POA: Diagnosis not present

## 2019-08-23 DIAGNOSIS — Z8349 Family history of other endocrine, nutritional and metabolic diseases: Secondary | ICD-10-CM | POA: Diagnosis not present

## 2019-08-23 DIAGNOSIS — Z801 Family history of malignant neoplasm of trachea, bronchus and lung: Secondary | ICD-10-CM | POA: Insufficient documentation

## 2019-08-23 DIAGNOSIS — F419 Anxiety disorder, unspecified: Secondary | ICD-10-CM | POA: Insufficient documentation

## 2019-08-23 DIAGNOSIS — E785 Hyperlipidemia, unspecified: Secondary | ICD-10-CM | POA: Insufficient documentation

## 2019-08-23 DIAGNOSIS — N35911 Unspecified urethral stricture, male, meatal: Secondary | ICD-10-CM | POA: Insufficient documentation

## 2019-08-23 DIAGNOSIS — F329 Major depressive disorder, single episode, unspecified: Secondary | ICD-10-CM | POA: Insufficient documentation

## 2019-08-23 DIAGNOSIS — I700 Atherosclerosis of aorta: Secondary | ICD-10-CM | POA: Insufficient documentation

## 2019-08-23 DIAGNOSIS — Z888 Allergy status to other drugs, medicaments and biological substances status: Secondary | ICD-10-CM | POA: Diagnosis not present

## 2019-08-23 DIAGNOSIS — K449 Diaphragmatic hernia without obstruction or gangrene: Secondary | ICD-10-CM | POA: Insufficient documentation

## 2019-08-23 DIAGNOSIS — N3081 Other cystitis with hematuria: Secondary | ICD-10-CM | POA: Insufficient documentation

## 2019-08-23 DIAGNOSIS — D414 Neoplasm of uncertain behavior of bladder: Secondary | ICD-10-CM | POA: Diagnosis not present

## 2019-08-23 DIAGNOSIS — Z823 Family history of stroke: Secondary | ICD-10-CM | POA: Diagnosis not present

## 2019-08-23 DIAGNOSIS — Z96651 Presence of right artificial knee joint: Secondary | ICD-10-CM | POA: Diagnosis not present

## 2019-08-23 DIAGNOSIS — K219 Gastro-esophageal reflux disease without esophagitis: Secondary | ICD-10-CM | POA: Insufficient documentation

## 2019-08-23 DIAGNOSIS — Z87442 Personal history of urinary calculi: Secondary | ICD-10-CM | POA: Diagnosis not present

## 2019-08-23 DIAGNOSIS — I10 Essential (primary) hypertension: Secondary | ICD-10-CM | POA: Insufficient documentation

## 2019-08-23 DIAGNOSIS — R310 Gross hematuria: Secondary | ICD-10-CM | POA: Diagnosis present

## 2019-08-23 HISTORY — DX: Herpesviral infection of urogenital system, unspecified: A60.00

## 2019-08-23 HISTORY — DX: Personal history of other malignant neoplasm of skin: Z85.828

## 2019-08-23 HISTORY — DX: Diaphragmatic hernia without obstruction or gangrene: K44.9

## 2019-08-23 HISTORY — DX: Personal history of other specified conditions: Z87.898

## 2019-08-23 HISTORY — DX: Essential (primary) hypertension: I10

## 2019-08-23 HISTORY — DX: Nocturia: R35.1

## 2019-08-23 HISTORY — PX: CYSTOSCOPY: SHX5120

## 2019-08-23 HISTORY — DX: Presence of spectacles and contact lenses: Z97.3

## 2019-08-23 HISTORY — DX: Other seasonal allergic rhinitis: J30.2

## 2019-08-23 HISTORY — DX: Personal history of urinary calculi: Z87.442

## 2019-08-23 HISTORY — DX: Urethral disorder, unspecified: N36.9

## 2019-08-23 HISTORY — DX: Gross hematuria: R31.0

## 2019-08-23 LAB — POCT I-STAT, CHEM 8
BUN: 16 mg/dL (ref 6–20)
Calcium, Ion: 1.17 mmol/L (ref 1.15–1.40)
Chloride: 102 mmol/L (ref 98–111)
Creatinine, Ser: 0.9 mg/dL (ref 0.61–1.24)
Glucose, Bld: 108 mg/dL — ABNORMAL HIGH (ref 70–99)
HCT: 50 % (ref 39.0–52.0)
Hemoglobin: 17 g/dL (ref 13.0–17.0)
Potassium: 5.4 mmol/L — ABNORMAL HIGH (ref 3.5–5.1)
Sodium: 141 mmol/L (ref 135–145)
TCO2: 28 mmol/L (ref 22–32)

## 2019-08-23 LAB — SURGICAL PATHOLOGY

## 2019-08-23 SURGERY — CYSTOSCOPY
Anesthesia: General | Site: Renal

## 2019-08-23 MED ORDER — DROPERIDOL 2.5 MG/ML IJ SOLN: 0.6250 mg | Freq: Once | INTRAMUSCULAR | Status: DC | PRN

## 2019-08-23 MED ORDER — DEXAMETHASONE SODIUM PHOSPHATE 10 MG/ML IJ SOLN
INTRAMUSCULAR | Status: AC
  Filled 2019-08-23: qty 1

## 2019-08-23 MED ORDER — LACTATED RINGERS IV SOLN: INTRAVENOUS | Status: DC

## 2019-08-23 MED ORDER — ONDANSETRON HCL 4 MG/2ML IJ SOLN
INTRAMUSCULAR | Status: DC | PRN
  Administered 2019-08-23: 4 mg via INTRAVENOUS

## 2019-08-23 MED ORDER — LIDOCAINE 2% (20 MG/ML) 5 ML SYRINGE
INTRAMUSCULAR | Status: DC | PRN
  Administered 2019-08-23: 80 mg via INTRAVENOUS

## 2019-08-23 MED ORDER — MIDAZOLAM HCL 5 MG/5ML IJ SOLN
INTRAMUSCULAR | Status: DC | PRN
  Administered 2019-08-23: 2 mg via INTRAVENOUS

## 2019-08-23 MED ORDER — PHENAZOPYRIDINE HCL 200 MG PO TABS
200.0000 mg | ORAL_TABLET | Freq: Three times a day (TID) | ORAL | 0 refills | Status: DC | PRN
Start: 2019-08-23 — End: 2020-04-19

## 2019-08-23 MED ORDER — PROPOFOL 10 MG/ML IV BOLUS
INTRAVENOUS | Status: AC
  Filled 2019-08-23: qty 40

## 2019-08-23 MED ORDER — FENTANYL CITRATE (PF) 100 MCG/2ML IJ SOLN: 25.0000 ug | INTRAMUSCULAR | Status: DC | PRN

## 2019-08-23 MED ORDER — BELLADONNA ALKALOIDS-OPIUM 16.2-60 MG RE SUPP
RECTAL | Status: DC | PRN
  Administered 2019-08-23: 1 via RECTAL

## 2019-08-23 MED ORDER — TRAMADOL HCL 50 MG PO TABS: 50.0000 mg | ORAL_TABLET | Freq: Four times a day (QID) | ORAL | 0 refills | Status: AC | PRN

## 2019-08-23 MED ORDER — PROPOFOL 10 MG/ML IV BOLUS
INTRAVENOUS | Status: DC | PRN
  Administered 2019-08-23: 200 mg via INTRAVENOUS

## 2019-08-23 MED ORDER — FENTANYL CITRATE (PF) 100 MCG/2ML IJ SOLN
INTRAMUSCULAR | Status: AC
  Filled 2019-08-23: qty 2

## 2019-08-23 MED ORDER — FENTANYL CITRATE (PF) 100 MCG/2ML IJ SOLN
INTRAMUSCULAR | Status: DC | PRN
  Administered 2019-08-23: 25 ug via INTRAVENOUS
  Administered 2019-08-23: 50 ug via INTRAVENOUS
  Administered 2019-08-23: 100 ug via INTRAVENOUS
  Administered 2019-08-23: 25 ug via INTRAVENOUS

## 2019-08-23 MED ORDER — BELLADONNA ALKALOIDS-OPIUM 16.2-60 MG RE SUPP
RECTAL | Status: AC
  Filled 2019-08-23: qty 1

## 2019-08-23 MED ORDER — KETOROLAC TROMETHAMINE 30 MG/ML IJ SOLN
INTRAMUSCULAR | Status: DC | PRN
Start: 2019-08-23 — End: 2019-08-23
  Administered 2019-08-23: 30 mg via INTRAVENOUS

## 2019-08-23 MED ORDER — LIDOCAINE 2% (20 MG/ML) 5 ML SYRINGE
INTRAMUSCULAR | Status: AC
  Filled 2019-08-23: qty 5

## 2019-08-23 MED ORDER — DEXAMETHASONE SODIUM PHOSPHATE 4 MG/ML IJ SOLN
INTRAMUSCULAR | Status: DC | PRN
  Administered 2019-08-23: 10 mg via INTRAVENOUS

## 2019-08-23 MED ORDER — KETOROLAC TROMETHAMINE 30 MG/ML IJ SOLN
INTRAMUSCULAR | Status: AC
  Filled 2019-08-23: qty 1

## 2019-08-23 MED ORDER — ONDANSETRON HCL 4 MG/2ML IJ SOLN: 4.0000 mg | Freq: Once | INTRAMUSCULAR | Status: DC | PRN

## 2019-08-23 MED ORDER — OXYCODONE HCL 5 MG PO TABS: 5.0000 mg | ORAL_TABLET | Freq: Once | ORAL | Status: DC | PRN

## 2019-08-23 MED ORDER — MIDAZOLAM HCL 2 MG/2ML IJ SOLN
INTRAMUSCULAR | Status: AC
  Filled 2019-08-23: qty 2

## 2019-08-23 MED ORDER — SODIUM CHLORIDE 0.9 % IR SOLN
Status: DC | PRN
  Administered 2019-08-23 (×2): 3000 mL

## 2019-08-23 MED ORDER — LIDOCAINE HCL URETHRAL/MUCOSAL 2 % EX GEL
CUTANEOUS | Status: DC | PRN
  Administered 2019-08-23: 1

## 2019-08-23 MED ORDER — OXYCODONE HCL 5 MG/5ML PO SOLN: 5.0000 mg | Freq: Once | ORAL | Status: DC | PRN

## 2019-08-23 MED ORDER — CEFAZOLIN SODIUM-DEXTROSE 2-4 GM/100ML-% IV SOLN
INTRAVENOUS | Status: AC
  Filled 2019-08-23: qty 100

## 2019-08-23 MED ORDER — CEFAZOLIN SODIUM-DEXTROSE 2-4 GM/100ML-% IV SOLN
2.0000 g | Freq: Once | INTRAVENOUS | Status: AC
  Administered 2019-08-23: 2 g via INTRAVENOUS

## 2019-08-23 MED ORDER — ONDANSETRON HCL 4 MG/2ML IJ SOLN
INTRAMUSCULAR | Status: AC
  Filled 2019-08-23: qty 2

## 2019-08-23 MED ORDER — OXYBUTYNIN CHLORIDE 5 MG PO TABS: 5.0000 mg | ORAL_TABLET | Freq: Three times a day (TID) | ORAL | 1 refills | Status: DC | PRN

## 2019-08-23 SURGICAL SUPPLY — 30 items
BAG DRAIN URO-CYSTO SKYTR STRL (DRAIN) ×2 IMPLANT
BAG DRN RND TRDRP ANRFLXCHMBR (UROLOGICAL SUPPLIES) ×1
BAG DRN UROCATH (DRAIN) ×1
BAG URINE DRAIN 2000ML AR STRL (UROLOGICAL SUPPLIES) ×2 IMPLANT
CATH FOLEY 2WAY SLVR  5CC 16FR (CATHETERS) ×2
CATH FOLEY 2WAY SLVR 5CC 16FR (CATHETERS) ×1 IMPLANT
CATH ROBINSON RED A/P 14FR (CATHETERS) IMPLANT
CLOTH BEACON ORANGE TIMEOUT ST (SAFETY) ×2 IMPLANT
ELECT REM PT RETURN 9FT ADLT (ELECTROSURGICAL)
ELECTRODE REM PT RTRN 9FT ADLT (ELECTROSURGICAL) IMPLANT
GLOVE BIO SURGEON STRL SZ7.5 (GLOVE) ×2 IMPLANT
GLOVE BIOGEL PI IND STRL 6.5 (GLOVE) ×1 IMPLANT
GLOVE BIOGEL PI INDICATOR 6.5 (GLOVE) ×1
GLOVE ECLIPSE 6.5 STRL STRAW (GLOVE) ×2 IMPLANT
GOWN STRL REUS W/ TWL XL LVL3 (GOWN DISPOSABLE) ×1 IMPLANT
GOWN STRL REUS W/TWL LRG LVL3 (GOWN DISPOSABLE) ×2 IMPLANT
GOWN STRL REUS W/TWL XL LVL3 (GOWN DISPOSABLE) ×2
HOLDER FOLEY CATH W/STRAP (MISCELLANEOUS) ×2 IMPLANT
IV NS IRRIG 3000ML ARTHROMATIC (IV SOLUTION) ×2 IMPLANT
KIT TURNOVER CYSTO (KITS) ×2 IMPLANT
LOOP CUT BIPOLAR 24F LRG (ELECTROSURGICAL) ×2 IMPLANT
MANIFOLD NEPTUNE II (INSTRUMENTS) ×2 IMPLANT
NDL SAFETY ECLIPSE 18X1.5 (NEEDLE) IMPLANT
NEEDLE HYPO 18GX1.5 SHARP (NEEDLE)
PACK CYSTO (CUSTOM PROCEDURE TRAY) ×2 IMPLANT
SYR 20ML LL LF (SYRINGE) IMPLANT
TUBE CONNECTING 12X1/4 (SUCTIONS) ×2 IMPLANT
TUBING UROLOGY SET (TUBING) ×2 IMPLANT
WATER STERILE IRR 3000ML UROMA (IV SOLUTION) IMPLANT
WATER STERILE IRR 500ML POUR (IV SOLUTION) ×2 IMPLANT

## 2019-08-23 NOTE — Transfer of Care (Signed)
Immediate Anesthesia Transfer of Care Note  Patient: Dale [name]  Procedure(s) Performed: Procedure(s) (LRB): CYSTOSCOPY WITH TRANSURETHRAL BIOPSY (N/A)  Patient Location: PACU  Anesthesia Type: General  Level of Consciousness: awake, sedated, patient cooperative and responds to stimulation  Airway & Oxygen Therapy: Patient Spontanous Breathing and Patient connected to [place] 02 and soft FM   Post-op Assessment: Report given to PACU RN, Post -op Vital signs reviewed and stable and Patient moving all extremities  Post vital signs: Reviewed and stable  Complications: No apparent anesthesia complications

## 2019-08-23 NOTE — H&P (Signed)
PRE-OP H&P  Office Visit Report  08/17/2019   ------------------------------------------------------------------------------- Dale [name]  MRN: [id number]  DOB: 27-Nov-1960, 59 year old Male  PRIMARY CARE:  Annie Main A. Sarajane Jews, MD  REFERRING:  Ishmael Holter. Sarajane Jews, MD  PROVIDER:  Ellison Hughs, M.D.  LOCATION:  [facility] [phone]     ------------------------------------------------------------------------------   CC/HPI: 07/04/19: The patient seen today with a chief complaint of persistent microscopic hematuria.  He was seen by his primary care physician on 06/05/19 with complaints of a mild burning sensation in the urethra on and off for a month with no visible blood or discharge and no voiding symptoms. He has experienced some hematuria intermittently however the last time he saw blood in his urine was more than a year ago. His urinalysis had 1+ blood by dipstick but no microscopic evaluation was performed.  He has not seen any change in his voiding pattern. He said the dysuria is very mild and seems to be random and not associated with any beverage, food or activity. He denies any flank pain.   08/17/19: Dale [name] was recently seen by Dr. Karsten Ro for persistent gross hematuria and he is here today for a cystoscopy to complete his evaluation. CTU from 07/20/19 was unremarkable for an overt source of his hematuria. The patient has a prior history of painless gross hematuria and underwent cystoscopy in 2016 and was found to have polyp like projections in the anterior urethra that have since been observed.     ALLERGIES: lisinopril - cough    MEDICATIONS: Omeprazole 20 mg tablet, delayed release  Amlodipine Besylate 5 mg tablet  Atorvastatin Calcium 10 mg tablet  Terbinafine     GU PSH: Locm 300-399Mg /Ml Iodine,1Ml - 07/20/2019 Vasectomy - 2014       [place] Notes: Knee Surgery Right, Rotator Cuff Repair, Knee Surgery, Knee Surgery, Spinal Laminectomy Decompressive, Surgery  Of Male Genitalia Vasectomy   NON-GU PSH: Back surgery Knee replacement, Right     GU PMH: Dysuria, I am unsure of the cause of his dysuria but this will be evaluated further with cystoscopy. - 07/04/2019 Microscopic hematuria, He has a history of having had gross hematuria in the past. He has been positive for microscopic hematuria since then and we discussed the need for re-evaluation. He also asked if the area that I saw in his urethra at the time cystoscopy previously could be treated and I told him that I did not think that this was causing his dysuria nor would I recommend treating something just because the presence of microscopic hematuria. - 07/04/2019 Gross hematuria, Gross hematuria - 2016 History of urolithiasis, Nephrolithiasis - 2014 Renal cyst, Renal cyst, acquired, left - 2014      PMH Notes: Gross hematuria: The patient was seen in the emergency room on 06/07/12 with complaints of blood coming from his penis. He had also noted blood spotting in his underwear after mowing the lawn. He apparently had undergone a urine culture in 4/14 it was found to be negative. A CT scan done on 06/07/12 without contrast revealed a probable cyst in left kidney but no evidence of renal or ureteral calculi. Cystoscopy at that time revealed his hematuria was from a single submucosal blood vessel in the bulbar urethra at about the 6:00 position.   NON-GU PMH: Encounter for general adult medical examination without abnormal findings, Encounter for preventive health examination - 2016 Allergy status to unsp drug/meds/biol subst, Allergies - 2014 Personal history of other diseases of the  digestive system, History of esophageal reflux - 2014 Personal history of other endocrine, nutritional and metabolic disease, History of hyperlipidemia - 2014 Personal history of other mental and behavioral disorders, History of depression - 2014    FAMILY HISTORY: Acute Myocardial Infarction - Father Coronary Artery  Disease - Father Depression - [place] _4__ Living Daughter - Runs In Family hyperlipidemia - Runs In Family Hypertension - Runs In Family Lung Cancer - Runs In Family Stroke Syndrome - Runs In Family   SOCIAL HISTORY: Marital Status: Divorced Preferred Language: English; Ethnicity: Not Hispanic Or Latino; Race: White Current Smoking Status: Patient has never smoked.   Tobacco Use Assessment Completed: Used Tobacco in last 30 days? Does drink.      Notes: Marital History - Divorced, Occupation:, Caffeine Use, Never A Smoker, Alcohol Use   REVIEW OF SYSTEMS:    GU Review Male:   Patient reports get up at night to urinate. Patient denies frequent urination, hard to postpone urination, burning/ pain with urination, leakage of urine, stream starts and stops, trouble starting your stream, have to strain to urinate , erection problems, and penile pain.  Gastrointestinal (Upper):   Patient denies nausea, vomiting, and indigestion/ heartburn.  Gastrointestinal (Lower):   Patient denies diarrhea and constipation.  Constitutional:   Patient denies fever, night sweats, weight loss, and fatigue.  Skin:   Patient denies skin rash/ lesion and itching.  Eyes:   Patient denies blurred vision and double vision.  Ears/ Nose/ Throat:   Patient denies sore throat and sinus problems.  Hematologic/Lymphatic:   Patient denies swollen glands and easy bruising.  Cardiovascular:   Patient denies leg swelling and chest pains.  Respiratory:   Patient denies cough and shortness of breath.  Endocrine:   Patient denies excessive thirst.  Musculoskeletal:   Patient denies back pain and joint pain.  Neurological:   Patient denies headaches and dizziness.  Psychologic:   Patient denies depression and anxiety.   VITAL SIGNS:      08/17/2019 08:10 AM  Weight 195 lb / 88.45 kg  Height 71 in / 180.34 cm  BP 149/89 mmHg  Heart Rate 99 /min  Temperature 96.9 F / 36.0 C  BMI 27.2  kg/m   GU PHYSICAL EXAMINATION:    Urethral Meatus: Normal size. No lesion, no wart, no discharge, no polyp. Normal location.  Penis: Circumcised, no warts, no cracks. No dorsal Peyronie's plaques, no left corporal Peyronie's plaques, no right corporal Peyronie's plaques, no scarring, no warts. No balanitis, no meatal stenosis.   MULTI-SYSTEM PHYSICAL EXAMINATION:    Constitutional: Well-nourished. No physical deformities. Normally developed. Good grooming.  Neurologic / Psychiatric: Oriented to time, oriented to place, oriented to person. No depression, no anxiety, no agitation.  Musculoskeletal: Normal gait and station of head and neck.     Complexity of Data:  Records Review:   Previous Doctor Records  X-Ray Review: C.T. Hematuria: Reviewed Films. Reviewed Report. Discussed With Patient.    Notes:                     CLINICAL DATA: Gross hematuria.   EXAM:  CT ABDOMEN AND PELVIS WITHOUT AND WITH CONTRAST   TECHNIQUE:  Multidetector CT imaging of the abdomen and pelvis was performed  following the standard protocol before and following the bolus  administration of intravenous contrast.   CONTRAST: 125 cc Omnipaque 300   COMPARISON: Noncontrast abdominopelvic CT 06/07/2012   FINDINGS:  Lower  chest: Clear lung bases. No significant pleural or pericardial  effusion.   Hepatobiliary: The liver is normal in density without suspicious  focal abnormality. Possible small gallstones. No gallbladder wall  thickening or biliary dilatation.   Pancreas: Unremarkable. No pancreatic ductal dilatation or  surrounding inflammatory changes.   Spleen: Normal in size without focal abnormality.   Adrenals/Urinary Tract: Both adrenal glands appear normal.  Pre-contrast images demonstrate no renal, ureteral or bladder  calculi. Post-contrast, both kidneys enhance normally. There is no  evidence of enhancing renal mass. 2.0 cm cyst in the interpolar  region of the left kidney has mildly  enlarged compared with the  previous study but appears simple. Delayed images result in  segmental visualization of the ureters. No focal upper tract  urothelial abnormalities are identified. The bladder appears  unremarkable.   Stomach/Bowel: No evidence of bowel wall thickening, distention or  surrounding inflammatory change. The appendix appears normal. Mild  sigmoid colon diverticular changes.   Vascular/Lymphatic: There are no enlarged abdominal or pelvic lymph  nodes. No acute vascular findings. Mild aortic and branch vessel  atherosclerosis. The portal, superior mesenteric and splenic veins  are patent.   Reproductive: The prostate gland and seminal vesicles appear normal.   Other: No evidence of abdominal wall mass or hernia. No ascites.   Musculoskeletal: No acute or significant osseous findings.  Progressive adjacent segment disease with disc space narrowing and  endplate osteophytes asymmetric to the left at L3-4. Previous L4-5  PLIF appears unchanged.   IMPRESSION:  1. No acute findings or explanation for hematuria identified. No  evidence of urinary tract calculus, enhancing renal mass or focal  upper tract urothelial lesion.  2. 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  3. Aortic Atherosclerosis (ICD10-I70.0).    Electronically Signed  By: Richardean Sale M.D.  On: 07/21/2019 12:58     PROCEDURES:         Flexible Cystoscopy - 52000  Risks, benefits, and some of the potential complications were discussed. Sterile technique and 2% Lidocaine intraurethral analgesia were used.  Meatus:  Normal size. Normal location. Normal condition.  Urethra:  No strictures.  External Sphincter:  Normal.  Verumontanum:  Normal.  Prostate:  Non-obstructing. No hyperplasia. Polyp like projections are seen along the anterior prostatic urethra at the bladder neck  Bladder Neck:  Non-obstructing.  Ureteral Orifices:  Normal location. Normal size. Normal shape. Effluxed clear  urine.  Bladder:  No trabeculation. No tumors. Normal mucosa. No stones.      The lower urinary tract was carefully examined. The procedure was well-tolerated and without complications. Instructions were given to call the office immediately for bloody urine, difficulty urinating, urinary retention, painful or frequent urination, fever or other illness. The patient stated that he understood these instructions and would comply with them.         Urinalysis Dipstick Dipstick Cont'd  Color: Amber Bilirubin: Neg mg/dL  Appearance: Clear Ketones: Neg mg/dL  Specific Gravity: 1.025 Blood: Neg ery/uL  pH: 5.5 Protein: Neg mg/dL  Glucose: Neg mg/dL Urobilinogen: 0.2 mg/dL    Nitrites: Neg    Leukocyte Esterase: Neg leu/uL    ASSESSMENT:      ICD-10 Details  1 GU:   Gross hematuria - R31.0 Chronic, Stable  2   Urethral disorders, Unspec - N36.9 Undiagnosed New Problem   PLAN:           Schedule Return Visit/Planned Activity: Next Available Appointment - Schedule Surgery  Document Letter(s):  Created for Patient: Clinical Summary   Created for Annie Main A. Sarajane Jews, MD         Notes:   Cystoscopy today revealed polyp-like projections involving the anterior prostatic urethra at the bladder neck. No papillary lesions were identified throughout the remainder of the bladder or urethra. These projections are likely the source of his gross hematuria. The risks, benefits and alternatives of cystoscopy with trans urethral biopsy was discussed with the patient. Risks include, but are not limited to, bleeding, urinary tract infection, urethral stricture formation, lower urinary tract symptoms and the inherent risk with general anesthesia. The patient voices understanding and wishes to proceed.

## 2019-08-23 NOTE — Discharge Instructions (Signed)
  Post Anesthesia Home Care Instructions  Activity: Get plenty of rest for the remainder of the day. A responsible adult should stay with you for 24 hours following the procedure.  For the next 24 hours, DO NOT: -Drive a car -Paediatric nurse -Drink alcoholic beverages -Take any medication unless instructed by your physician -Make any legal decisions or sign important papers.  Meals: Start with liquid foods such as gelatin or soup. Progress to regular foods as tolerated. Avoid greasy, spicy, heavy foods. If nausea and/or vomiting occur, drink only clear liquids until the nausea and/or vomiting subsides. Call your physician if vomiting continues.  Special Instructions/Symptoms: Your throat may feel dry or sore from the anesthesia or the breathing tube placed in your throat during surgery. If this causes discomfort, gargle with warm salt water. The discomfort should disappear within 24 hours.  If you had a scopolamine patch placed behind your ear for the management of post- operative nausea and/or vomiting:  1. The medication in the patch is effective for 72 hours, after which it should be removed.  Wrap patch in a tissue and discard in the trash. Wash hands thoroughly with soap and water. 2. You may remove the patch earlier than 72 hours if you experience unpleasant side effects which may include dry mouth, dizziness or visual disturbances. 3. Avoid touching the patch. Wash your hands with soap and water after contact with the patch.   NO ADVIL, ALEVE, MOTRIN, IBUPROFEN UNTIL 4 PM TODAY

## 2019-08-23 NOTE — Anesthesia Postprocedure Evaluation (Signed)
Anesthesia Post Note  Patient: Dale [name]  Procedure(s) Performed: CYSTOSCOPY WITH TRANSURETHRAL BIOPSY (N/A Renal)     Patient location during evaluation: PACU Anesthesia Type: General Level of consciousness: awake and alert and oriented Pain management: pain level controlled Vital Signs Assessment: post-procedure vital signs reviewed and stable Respiratory status: spontaneous breathing, nonlabored ventilation and respiratory function stable Cardiovascular status: blood pressure returned to baseline and stable Postop Assessment: no apparent nausea or vomiting Anesthetic complications: no   No complications documented.  Last Vitals:  Vitals:   08/23/19 1030 08/23/19 1045  BP: (!) 143/93 (!) 127/97  Pulse: 79 72  Resp: 19 14  Temp:    SpO2: 95% 94%    Last Pain:  Vitals:   08/23/19 1045  TempSrc:   PainSc: 0-No pain                 Maisha Bogen A.

## 2019-08-23 NOTE — Anesthesia Procedure Notes (Signed)
Procedure Name: LMA Insertion Date/Time: 08/23/2019 9:28 AM Performed by: Justice Rocher, CRNA Pre-anesthesia Checklist: Patient identified, Emergency Drugs available, Suction available, Patient being monitored and Timeout performed Patient Re-evaluated:Patient Re-evaluated prior to induction Oxygen Delivery Method: Circle system utilized Preoxygenation: Pre-oxygenation with 100% oxygen Induction Type: IV induction Ventilation: Mask ventilation without difficulty LMA: LMA inserted LMA Size: 5.0 Number of attempts: 1 Airway Equipment and Method: Bite block Placement Confirmation: positive ETCO2,  breath sounds checked- equal and bilateral and CO2 detector Tube secured with: Tape Dental Injury: Teeth and Oropharynx as per pre-operative assessment

## 2019-08-23 NOTE — Op Note (Signed)
Operative Note  Preoperative diagnosis:  1.  Polyp-like projections circumferentially involving the bladder neck/prostatic urethra 2.  Gross hematuria  Postoperative diagnosis: 1.  Polyp-like projections circumferentially involving the bladder neck/prostatic urethra 2.  Gross hematuria 3.  Meatal stenosis  Procedure(s): 1.  Cystoscopy with transurethral resection of prostatic urethral lesions 2.  Urethral dilation  Surgeon: Ellison Hughs, MD  Assistants:  None  Anesthesia:  General  Complications:  None  EBL: 5 mL  Specimens: 1.  Bladder neck and prostatic urethral biopsies  Drains/Catheters: 1.  18 French Foley catheter   Intraoperative findings:   1. Glanular hypospadias with meatal stenosis 2. Polyp-like projections circumferentially involving the bladder neck/prostatic urethra with no other intravesical abnormalities  Indication:  Dale [name] is a 59 y.o. male with a history of recurrent episodes of gross hematuria.  He recently underwent office cystoscopy and was found to have polyp-like mucosal projections circumferentially involving the bladder neck/prostatic urethra.  He has been consented for the above procedures, voices understanding and wishes to proceed.  Description of procedure:  After informed consent was obtained, the patient was brought to the operating room and general LMA anesthesia was administered. The patient was then placed in the dorsolithotomy position and prepped and draped in the usual sterile fashion. A timeout was performed. A 23 French rigid cystoscope was then inserted into the urethral meatus and advanced into the bladder under direct vision. A complete bladder survey revealed the findings listed above.  I attempted to place a 52 French resectoscope, but the patient's urethral meatus was slightly stenotic.  Leander Rams sounds were then used to dilate the distal penile urethra, starting at 45 [place] and progressing up to 50 [place].   Following dilation, I was able to easily pass the 26 French resectoscope with a bipolar loop working element.  The bladder neck and prostatic urethral lesions were then resected using the bipolar loop.  The specimens were then sent off for permanent section.  The area of resection was then extensively fulgurated until hemostasis was achieved.  The patient was noted to have a mild amount of urethral bleeding following dilation.  I then placed a an 31 French Foley catheter to help tamponade that bleeding, which resolved intraoperatively.  The patient tolerated the procedure well and was transferred to the postanesthesia in stable condition.  Plan: The patient has been instructed to remove his Foley catheter at 7 AM on 08/25/2019.  Follow-up on 09/11/2019 to discuss pathology results.

## 2019-08-24 ENCOUNTER — Encounter (HOSPITAL_BASED_OUTPATIENT_CLINIC_OR_DEPARTMENT_OTHER): Payer: Self-pay | Admitting: Urology

## 2019-09-28 ENCOUNTER — Telehealth: Payer: Self-pay | Admitting: Family Medicine
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
## 2019-09-29 MED ORDER — HYDROCODONE-ACETAMINOPHEN 10-325 MG PO TABS: 1.0000 | ORAL_TABLET | ORAL | 0 refills | Status: AC | PRN

## 2019-09-29 NOTE — Telephone Encounter (Signed)
I sent in a small supply to get him to next week

## 2019-09-29 NOTE — Addendum Note (Signed)
Addended by: Alysia Penna A on: 09/29/2019 12:49 PM   Modules accepted: Orders

## 2019-09-29 NOTE — Telephone Encounter (Signed)
Spoke with pt, he is aware rx has been sent in.

## 2019-10-25 ENCOUNTER — Other Ambulatory Visit (HOSPITAL_BASED_OUTPATIENT_CLINIC_OR_DEPARTMENT_OTHER): Payer: Self-pay | Admitting: Neurosurgery

## 2019-10-25 DIAGNOSIS — G540 Brachial plexus disorders: Secondary | ICD-10-CM

## 2019-10-28 ENCOUNTER — Ambulatory Visit (HOSPITAL_BASED_OUTPATIENT_CLINIC_OR_DEPARTMENT_OTHER)
Admission: RE | Admit: 2019-10-28 | Discharge: 2019-10-28 | Disposition: A | Discharge status: Home / Self Care | Payer: 59 | Source: Ambulatory Visit | Attending: Neurosurgery | Admitting: Neurosurgery

## 2019-10-28 ENCOUNTER — Other Ambulatory Visit: Payer: Self-pay

## 2019-10-28 DIAGNOSIS — G540 Brachial plexus disorders: Secondary | ICD-10-CM

## 2019-10-28 MED ORDER — GADOBUTROL 1 MMOL/ML IV SOLN
8.0000 mL | Freq: Once | INTRAVENOUS | Status: AC | PRN
  Administered 2019-10-28: 8 mL via INTRAVENOUS

## 2019-11-08 ENCOUNTER — Telehealth: Payer: Self-pay

## 2019-11-08 ENCOUNTER — Other Ambulatory Visit: Payer: Self-pay | Admitting: Neurosurgery

## 2019-11-08 DIAGNOSIS — M5412 Radiculopathy, cervical region: Secondary | ICD-10-CM

## 2019-11-08 NOTE — Telephone Encounter (Signed)
Phone call to patient to verify medication list and allergies for myelogram procedure. Medications pt is currently taking are safe to continue to take. Advised pt if any new medications are started prior to procedure to call and make [place] aware. Pt also instructed to have a driver the day of the procedure and discharge instructions were discussed. Pt verbalized understanding.

## 2019-11-14 NOTE — Discharge Instructions (Signed)

## 2019-11-15 ENCOUNTER — Ambulatory Visit
Admission: RE | Admit: 2019-11-15 | Discharge: 2019-11-15 | Disposition: A | Discharge status: Home / Self Care | Payer: 59 | Source: Ambulatory Visit | Attending: Neurosurgery | Admitting: Neurosurgery

## 2019-11-15 ENCOUNTER — Other Ambulatory Visit: Payer: Self-pay

## 2019-11-15 DIAGNOSIS — M5412 Radiculopathy, cervical region: Secondary | ICD-10-CM

## 2019-11-15 MED ORDER — DIAZEPAM 5 MG PO TABS
10.0000 mg | ORAL_TABLET | Freq: Once | ORAL | Status: AC
  Administered 2019-11-15: 10 mg via ORAL

## 2019-11-15 MED ORDER — IOPAMIDOL (ISOVUE-M 300) INJECTION 61%
10.0000 mL | Freq: Once | INTRAMUSCULAR | Status: AC
  Administered 2019-11-15: 10 mL via INTRATHECAL

## 2019-12-16 ENCOUNTER — Other Ambulatory Visit: Payer: Self-pay | Admitting: Family Medicine

## 2020-01-02 ENCOUNTER — Ambulatory Visit (INDEPENDENT_AMBULATORY_CARE_PROVIDER_SITE_OTHER): Payer: 59 | Admitting: Orthopaedic Surgery

## 2020-01-02 ENCOUNTER — Encounter: Payer: Self-pay | Admitting: Orthopaedic Surgery

## 2020-01-02 VITALS — BP 157/98 | HR 105

## 2020-01-02 DIAGNOSIS — M25512 Pain in left shoulder: Secondary | ICD-10-CM

## 2020-01-02 DIAGNOSIS — Z981 Arthrodesis status: Secondary | ICD-10-CM | POA: Diagnosis not present
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
## 2020-01-03 DIAGNOSIS — Z981 Arthrodesis status: Secondary | ICD-10-CM | POA: Insufficient documentation

## 2020-01-08 ENCOUNTER — Other Ambulatory Visit: Payer: Self-pay | Admitting: Family Medicine

## 2020-01-22 ENCOUNTER — Ambulatory Visit: Payer: 59 | Admitting: Rehabilitative and Restorative Service Providers"

## 2020-01-30 ENCOUNTER — Encounter: Payer: Self-pay | Admitting: Rehabilitative and Restorative Service Providers"

## 2020-01-30 ENCOUNTER — Ambulatory Visit: Payer: 59 | Admitting: Rehabilitative and Restorative Service Providers"

## 2020-01-30 ENCOUNTER — Other Ambulatory Visit: Payer: Self-pay

## 2020-01-30 DIAGNOSIS — M25512 Pain in left shoulder: Secondary | ICD-10-CM

## 2020-01-30 DIAGNOSIS — M542 Cervicalgia: Secondary | ICD-10-CM

## 2020-01-30 DIAGNOSIS — M6281 Muscle weakness (generalized): Secondary | ICD-10-CM | POA: Diagnosis not present

## 2020-01-30 NOTE — Patient Instructions (Signed)
Access Code: [email or web address] URL: [email or web address] Date: 01/30/2020 Prepared by: Scot Jun  Exercises Standing Isometric Shoulder Internal Rotation at Doorway - 2 x daily - 7 x weekly - 1 sets - 10 reps - 5 hold Isometric Shoulder External Rotation at Wall - 2 x daily - 7 x weekly - 1 sets - 10 reps - 5 hold Supine Shoulder Flexion AAROM with Hands Clasped - 2 x daily - 7 x weekly - 1 sets - 10 reps - 5 hold Supine Shoulder Flexion Extension Full Range AROM - 2 x daily - 7 x weekly - 3 sets - 10 reps Isometric Shoulder Flexion at Wall - 1 x daily - 7 x weekly - 1 sets - 10 reps - 5 hold
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
## 2020-02-07 ENCOUNTER — Ambulatory Visit: Payer: 59 | Admitting: Physical Therapy

## 2020-02-07 ENCOUNTER — Encounter: Payer: Self-pay | Admitting: Physical Therapy

## 2020-02-07 ENCOUNTER — Other Ambulatory Visit: Payer: Self-pay

## 2020-02-07 DIAGNOSIS — M25512 Pain in left shoulder: Secondary | ICD-10-CM

## 2020-02-07 DIAGNOSIS — M6281 Muscle weakness (generalized): Secondary | ICD-10-CM | POA: Diagnosis not present

## 2020-02-07 DIAGNOSIS — M542 Cervicalgia: Secondary | ICD-10-CM

## 2020-02-07 NOTE — Therapy (Signed)
[facility] [place], [place], [address] Phone: [phone]   Fax:  [phone]  Physical Therapy Treatment  Patient Details  Name: Dale [name] MRN: [id number] Date of Birth: [birth year] Referring Provider (PT): Dr Lorin Mercy   Encounter Date: 02/07/2020   PT End of Session - 02/07/20 1037    Visit Number 2    Number of Visits 20    Date for PT Re-Evaluation 03/26/20    Progress Note Due on Visit 10    PT Start Time 0930    PT Stop Time 1010    PT Time Calculation (min) 40 min           Past Medical History:  Diagnosis Date  . Allergic rhinitis, seasonal   . Anxiety   . Arthritis    back  . Depression   . Genital herpes   . GERD (gastroesophageal reflux disease)   . Gross hematuria   . Hiatal hernia   . History of chest pain    s/p  nuclear stress test, 08-26-2009, normal perfusion without ischemia , ef 58%, stated non-cardiac chest pain  . History of kidney stones   . History of nonmelanoma skin cancer    excision left leg SCC;  excision of chest BCC   . Hyperlipidemia   . Hypertension    followed by pcp   . Nocturia   . Urethral lesion    prostatic  . Wears glasses     Past Surgical History:  Procedure Laterality Date  . COLONOSCOPY  last one 07-01-2018  . CYSTOSCOPY N/A 08/23/2019   Procedure: CYSTOSCOPY WITH TRANSURETHRAL BIOPSY;  Surgeon: Ceasar Mons, MD;  Location: [facility];  Service: Urology;  Laterality: N/A;  . ESOPHAGOGASTRODUODENOSCOPY  05/28/2017  . KNEE ARTHROSCOPY Right 01/09/2013   Procedure: ARTHROSCOPY KNEE;  Surgeon: Marybelle Killings, MD;  Location: [facility];  Service: Orthopedics;  Laterality: Right;  Right knee scope, partial medial menisectomy  . KNEE ARTHROSCOPY Left 2007  . KNEE SURGERY Right 2585;  1989;  1990; 2001; last one @ [facility] 06-10-2000  . [place] SURGERY  2018  . POSTERIOR LUMBAR FUSION  12-21-2011  @[facility]    L4--5  . ROTATOR CUFF REPAIR Left 2003  . TOTAL  KNEE ARTHROPLASTY Right 07/10/2013   Procedure: RIGHT TOTAL KNEE ARTHROPLASTY;  Surgeon: Gearlean Alf, MD;  Location: [facility];  Service: Orthopedics;  Laterality: Right;  Marland Kitchen VASECTOMY      There were no vitals filed for this visit.   Subjective Assessment - 02/07/20 0937    Subjective Pt arrivng to therapyy reporting 3-4/10 pain in left shoulder after doing his home exercise program. Pt reporting throbbing left shoulder pain today.    Pertinent History RTC repair on Lt 2003ish per Pt.    Limitations Lifting;House hold activities;Other (comment)    Patient Stated Goals Improve Lt arm strength and use    Currently in Pain? Yes    Pain Score 4     Pain Location Neck    Pain Orientation Left    Pain Descriptors / Indicators Throbbing    Pain Onset More than a month ago                             [facility] Adult PT Treatment/Exercise - 02/07/20 0001      Blood Flow Restriction   Blood Flow Restriction Yes      Blood Flow Restriction-Positions  Blood Flow Restriction Position Sitting      BFR Sitting   Sitting Limb Occulsion Pressure (mmHg) 165    Sitting Exercise Pressure (mmHg) 100    Sitting Exercise Prescription Comment 15 x4 with 30 second rest break      Neck Exercises: Seated   Shoulder Flexion Other reps (comment)    Shoulder Flexion Weights (lbs) BFR (100mg HG) 15 x 4 with 30 second rest breaks      Shoulder Exercises: Sidelying   External Rotation Strengthening;Left;Limitations    External Rotation Limitations BFR: 15 x 4 with 30 second rest breaks      Shoulder Exercises: Isometric Strengthening   Flexion Limitations BFR: (132mmHg) 15 x 4 holding 3-5 seconds with 30   second rest break    External Rotation Limitations BFR: (119mmHg) 15 x 4 with 30 second rest break)      Modalities   Modalities Cryotherapy      Cryotherapy   Number Minutes Cryotherapy 10 Minutes    Cryotherapy Location Shoulder    Type of Cryotherapy Ice pack       Vasopneumatic   Number Minutes Vasopneumatic  --    Vasopnuematic Location  --    Vasopneumatic Pressure --    Vasopneumatic Temperature  --                    PT Short Term Goals - 02/07/20 1016      PT SHORT TERM GOAL #1   Title Patient will demonstrate independent use of home exercise program to maintain progress from in clinic treatments.    Time 3    Period Weeks    Status New    Target Date 02/20/20             PT Long Term Goals - 01/30/20 1139      PT LONG TERM GOAL #1   Title Patient will demonstrate/report pain at worst less than or equal to 2/10 to facilitate minimal limitation in daily activity secondary to pain symptoms.    Time 10    Period Weeks    Status New    Target Date 03/26/20      PT LONG TERM GOAL #2   Title Patient will demonstrate independent use of home exercise program to facilitate ability to maintain/progress functional gains from skilled physical therapy services. 8    Time 10    Period Weeks    Status New    Target Date 03/26/20      PT LONG TERM GOAL #3   Title Pt. will demonstrate Lt GH jt AROM WFL s symptoms to facilitate usual overhead reaching, self care, ressing at PLOF s limitation.    Time 10    Period Weeks    Status New    Target Date 04/09/20      PT LONG TERM GOAL #4   Title Pt. will demonstrate Lt UE MMT > or = 4+/5 to facilitate ability to perform usual daily and workout activity at PLOF.    Time 10    Period Weeks    Status New    Target Date 04/09/20      PT LONG TERM GOAL #5   Title Pt. will demonstrate Lt grip strength 85% of Lt to facilitate improved strength and function.    Time 10    Period Weeks    Status New    Target Date 04/09/20      Additional Long Term Goals   Additional Long Term  Goals Yes      PT LONG TERM GOAL #6   Title Pt. will demonstrate FOTO outcome 70% status.    Time 10    Period Weeks    Status New    Target Date 04/09/20                 Plan - 02/07/20 1013     Clinical Impression Statement Pt arriving to therpay reporting 4/10 pain in his left shoulder. Pt tolerating Blood Flow Restriction therapy with fatigue during last two sets. Continue skilled PT to progress with more functional movements.    Personal Factors and Comorbidities Comorbidity 3+    Comorbidities HTN, hyperlipidemia, GERD    Examination-Activity Limitations Bathing;Carry;Lift;Dressing;Reach Overhead    Examination-Participation Restrictions Cleaning;Community Activity;Driving;Yard Work;Other    Stability/Clinical Decision Making Evolving/Moderate complexity    Rehab Potential Fair    PT Frequency 2x / week    PT Treatment/Interventions ADLs/Self Care Home Management;Cryotherapy;Electrical Stimulation;Moist Heat;Balance training;Therapeutic exercise;Iontophoresis 4mg /ml Dexamethasone;Therapeutic activities;Functional mobility training;Stair training;Gait training;Ultrasound;DME Instruction;Neuromuscular re-education;Patient/family education;Manual techniques;Taping;Dry needling;Passive range of motion;Joint Manipulations    PT Next Visit Plan NMES for supraspinatus, infraspinatus, BFR    PT Home Exercise Plan [id number]    Consulted and Agree with Plan of Care Patient           Patient will benefit from skilled therapeutic intervention in order to improve the following deficits and impairments:  Decreased endurance,Decreased activity tolerance,Decreased strength,Impaired UE functional use,Pain,Decreased mobility,Decreased range of motion,Impaired perceived functional ability,Postural dysfunction,Impaired flexibility,Decreased coordination  Visit Diagnosis: Left shoulder pain, unspecified chronicity  Cervicalgia  Muscle weakness (generalized)     Problem List Patient Active Problem List   Diagnosis Date Noted  . S/P cervical spinal fusion 01/03/2020  . GERD (gastroesophageal reflux disease) 03/01/2017  . Right Achilles tendinitis 12/21/2016  . HTN (hypertension)  03/03/2016  . OA (osteoarthritis) of knee 07/10/2013  . Genital herpes 05/02/2013  . CHEST PAIN 08/19/2009  . LOW BACK PAIN 06/03/2009  . ERECTILE DYSFUNCTION 06/07/2008  . HYPERLIPIDEMIA 04/03/2008  . DEPRESSION 04/03/2008  . Allergic rhinitis 04/03/2008  . INSOMNIA 04/03/2008  . NEPHROLITHIASIS, HX OF 04/03/2008    Oretha Caprice  PT, MPT 02/07/2020, 10:38 AM  [facility] [address] [place], [place], [address] Phone: [phone]   Fax:  [phone]  Name: Dale [name] MRN: [id number] Date of Birth: [birth year]

## 2020-02-09 ENCOUNTER — Ambulatory Visit: Payer: 59 | Admitting: Orthopaedic Surgery

## 2020-02-09 ENCOUNTER — Encounter: Payer: Self-pay | Admitting: Orthopaedic Surgery

## 2020-02-09 ENCOUNTER — Other Ambulatory Visit: Payer: Self-pay

## 2020-02-09 VITALS — BP 149/95 | HR 108 | Ht 71.0 in | Wt 185.0 lb

## 2020-02-09 DIAGNOSIS — M25562 Pain in left knee: Secondary | ICD-10-CM

## 2020-02-09 DIAGNOSIS — M65962 Unspecified synovitis and tenosynovitis, left lower leg: Secondary | ICD-10-CM | POA: Insufficient documentation

## 2020-02-09 DIAGNOSIS — G8929 Other chronic pain: Secondary | ICD-10-CM

## 2020-02-09 DIAGNOSIS — M659 Synovitis and tenosynovitis, unspecified: Secondary | ICD-10-CM | POA: Diagnosis not present

## 2020-02-09 MED ORDER — LIDOCAINE HCL 1 % IJ SOLN
0.5000 mL | INTRAMUSCULAR | Status: AC | PRN
  Administered 2020-02-09: .5 mL

## 2020-02-09 MED ORDER — BUPIVACAINE HCL 0.5 % IJ SOLN
3.0000 mL | INTRAMUSCULAR | Status: AC | PRN
  Administered 2020-02-09: 3 mL via INTRA_ARTICULAR

## 2020-02-09 MED ORDER — METHYLPREDNISOLONE ACETATE 40 MG/ML IJ SUSP
40.0000 mg | INTRAMUSCULAR | Status: AC | PRN
  Administered 2020-02-09: 40 mg via INTRA_ARTICULAR
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
## 2020-02-12 LAB — TIQ-NTM

## 2020-02-12 LAB — SYNOVIAL FLUID ANALYSIS, COMPLETE
Basophils, %: 0 %
Eosinophils-Synovial: 0 % (ref 0–2)
Lymphocytes-Synovial Fld: 0 % (ref 0–74)
Monocyte/Macrophage: 8 % (ref 0–69)
Neutrophil, Synovial: 92 % — ABNORMAL HIGH (ref 0–24)
Synoviocytes, %: 0 % (ref 0–15)
WBC, Synovial: 14785 cells/uL — ABNORMAL HIGH (ref <150)

## 2020-02-12 LAB — RHEUMATOID FACTOR: Rheumatoid fact SerPl-aCnc: <14 IU/mL (ref <14)

## 2020-02-12 LAB — SEDIMENTATION RATE: Sed Rate: 2 mm/h (ref 0–20)

## 2020-02-12 LAB — ANA: Anti Nuclear Antibody (ANA): NEGATIVE

## 2020-02-12 LAB — URIC ACID: Uric Acid, Serum: 5.4 mg/dL (ref 4.0–8.0)

## 2020-02-13 ENCOUNTER — Ambulatory Visit (INDEPENDENT_AMBULATORY_CARE_PROVIDER_SITE_OTHER): Payer: 59 | Admitting: Orthopaedic Surgery

## 2020-02-13 ENCOUNTER — Encounter: Payer: Self-pay | Admitting: Rehabilitative and Restorative Service Providers"

## 2020-02-13 ENCOUNTER — Encounter: Payer: Self-pay | Admitting: Orthopaedic Surgery

## 2020-02-13 ENCOUNTER — Ambulatory Visit: Payer: 59 | Admitting: Rehabilitative and Restorative Service Providers"

## 2020-02-13 ENCOUNTER — Other Ambulatory Visit: Payer: Self-pay

## 2020-02-13 DIAGNOSIS — M6281 Muscle weakness (generalized): Secondary | ICD-10-CM

## 2020-02-13 DIAGNOSIS — M1A0620 Idiopathic chronic gout, left knee, without tophus (tophi): Secondary | ICD-10-CM

## 2020-02-13 DIAGNOSIS — M542 Cervicalgia: Secondary | ICD-10-CM | POA: Diagnosis not present

## 2020-02-13 DIAGNOSIS — M25512 Pain in left shoulder: Secondary | ICD-10-CM | POA: Diagnosis not present

## 2020-02-13 MED ORDER — ALLOPURINOL 100 MG PO TABS: 100.0000 mg | ORAL_TABLET | Freq: Every day | ORAL | 0 refills | Status: DC

## 2020-02-13 MED ORDER — COLCHICINE 0.6 MG PO TABS: 0.6000 mg | ORAL_TABLET | Freq: Every day | ORAL | 1 refills | Status: DC

## 2020-02-13 NOTE — Therapy (Signed)
[place], [place], [address] Phone: [phone]   Fax:  [phone]  Physical Therapy Treatment  Patient Details  Name: Dale [name] MRN: [id number] Date of Birth: [birth year] Referring Provider (PT): Dr Lorin Mercy   Encounter Date: 02/13/2020   PT End of Session - 02/13/20 1043    Visit Number 3    Number of Visits 20    Date for PT Re-Evaluation 03/26/20    Progress Note Due on Visit 10    PT Start Time 1010    PT Stop Time 1050    PT Time Calculation (min) 40 min    Activity Tolerance Patient tolerated treatment well    Behavior During Therapy [facility] for tasks assessed/performed           Past Medical History:  Diagnosis Date  . Allergic rhinitis, seasonal   . Anxiety   . Arthritis    back  . Depression   . Genital herpes   . GERD (gastroesophageal reflux disease)   . Gross hematuria   . Hiatal hernia   . History of chest pain    s/p  nuclear stress test, 08-26-2009, normal perfusion without ischemia , ef 58%, stated non-cardiac chest pain  . History of kidney stones   . History of nonmelanoma skin cancer    excision left leg SCC;  excision of chest BCC   . Hyperlipidemia   . Hypertension    followed by pcp   . Nocturia   . Urethral lesion    prostatic  . Wears glasses     Past Surgical History:  Procedure Laterality Date  . COLONOSCOPY  last one 07-01-2018  . CYSTOSCOPY N/A 08/23/2019   Procedure: CYSTOSCOPY WITH TRANSURETHRAL BIOPSY;  Surgeon: Ceasar Mons, MD;  Location: [facility];  Service: Urology;  Laterality: N/A;  . ESOPHAGOGASTRODUODENOSCOPY  05/28/2017  . KNEE ARTHROSCOPY Right 01/09/2013   Procedure: ARTHROSCOPY KNEE;  Surgeon: Marybelle Killings, MD;  Location: [place];  Service: Orthopedics;  Laterality: Right;  Right knee scope, partial medial menisectomy  . KNEE ARTHROSCOPY Left 2007  . KNEE SURGERY Right 4259;  1989;  1990; 2001; last one @ [facility] 06-10-2000  . [place] SURGERY  2018  . POSTERIOR LUMBAR FUSION  12-21-2011  @[facility]    L4--5  . ROTATOR CUFF REPAIR Left 2003  . TOTAL KNEE ARTHROPLASTY Right 07/10/2013   Procedure: RIGHT TOTAL KNEE ARTHROPLASTY;  Surgeon: Gearlean Alf, MD;  Location: [facility];  Service: Orthopedics;  Laterality: Right;  Marland Kitchen VASECTOMY      There were no vitals filed for this visit.   Subjective Assessment - 02/13/20 1040    Subjective Pt. indicated back of shoulder pain still there, noted at night some to prevent sleeping    Pertinent History RTC repair on Lt 2003ish per Pt.    Limitations Lifting;House hold activities;Other (comment)    Patient Stated Goals Improve Lt arm strength and use    Currently in Pain? Yes    Pain Score 4     Pain Location Shoulder    Pain Orientation Left;Posterior    Pain Onset More than a month ago    Aggravating Factors  nighttime                             [facility] Adult PT Treatment/Exercise - 02/13/20 0001      Blood Flow Restriction   Blood Flow Restriction  Yes      Blood Flow Restriction-Positions    Blood Flow Restriction Position Sitting      BFR Sitting   Sitting Limb Occulsion Pressure (mmHg) 168    Sitting Exercise Prescription Comment cuff size 2      Shoulder Exercises: Sidelying   External Rotation Strengthening;Left   BFR 90 mmHg 1 lb 30x, 15 x, 2 x 15 0 lbs   External Rotation Weight (lbs) 1    ABduction Strengthening;Left   BFR 90 mmHG 4 x 15     Shoulder Exercises: ROM/Strengthening   UBE (Upper Arm Bike) Lvl 3.0 5 mins fwd, 5 mins backward   BFR 90 mmHg     Manual Therapy   Manual therapy comments compression to Lt infraspinatus            Trigger Point Dry Needling - 02/13/20 0001    Consent Given? Yes    Education Handout Provided Yes    Muscles Treated Upper Quadrant Infraspinatus   Lt   Infraspinatus Response Twitch response elicited                PT Education - 02/13/20 1040    Education Details DN    Person(s) Educated  Patient    Methods Explanation;Handout    Comprehension Verbalized understanding            PT Short Term Goals - 02/07/20 1016      PT SHORT TERM GOAL #1   Title Patient will demonstrate independent use of home exercise program to maintain progress from in clinic treatments.    Time 3    Period Weeks    Status New    Target Date 02/20/20             PT Long Term Goals - 01/30/20 1139      PT LONG TERM GOAL #1   Title Patient will demonstrate/report pain at worst less than or equal to 2/10 to facilitate minimal limitation in daily activity secondary to pain symptoms.    Time 10    Period Weeks    Status New    Target Date 03/26/20      PT LONG TERM GOAL #2   Title Patient will demonstrate independent use of home exercise program to facilitate ability to maintain/progress functional gains from skilled physical therapy services. 8    Time 10    Period Weeks    Status New    Target Date 03/26/20      PT LONG TERM GOAL #3   Title Pt. will demonstrate Lt GH jt AROM WFL s symptoms to facilitate usual overhead reaching, self care, ressing at PLOF s limitation.    Time 10    Period Weeks    Status New    Target Date 04/09/20      PT LONG TERM GOAL #4   Title Pt. will demonstrate Lt UE MMT > or = 4+/5 to facilitate ability to perform usual daily and workout activity at PLOF.    Time 10    Period Weeks    Status New    Target Date 04/09/20      PT LONG TERM GOAL #5   Title Pt. will demonstrate Lt grip strength 85% of Lt to facilitate improved strength and function.    Time 10    Period Weeks    Status New    Target Date 04/09/20      Additional Long Term Goals   Additional Long Term Goals  Yes      PT LONG TERM GOAL #6   Title Pt. will demonstrate FOTO outcome 70% status.    Time 10    Period Weeks    Status New    Target Date 04/09/20                 Plan - 02/13/20 1041    Clinical Impression Statement Concordant pain from infraspinatus TrP noted  today in trigger point release techniques.  ER AROM improving.  Atrophy still evident in supraspinatus, infraspinatus.  Continued use of BFR, possible NMES appropraite.    Personal Factors and Comorbidities Comorbidity 3+    Comorbidities HTN, hyperlipidemia, GERD    Examination-Activity Limitations Bathing;Carry;Lift;Dressing;Reach Overhead    Examination-Participation Restrictions Cleaning;Community Activity;Driving;Yard Work;Other    Stability/Clinical Decision Making Evolving/Moderate complexity    Rehab Potential Fair    PT Frequency 2x / week    PT Treatment/Interventions ADLs/Self Care Home Management;Cryotherapy;Electrical Stimulation;Moist Heat;Balance training;Therapeutic exercise;Iontophoresis 4mg /ml Dexamethasone;Therapeutic activities;Functional mobility training;Stair training;Gait training;Ultrasound;DME Instruction;Neuromuscular re-education;Patient/family education;Manual techniques;Taping;Dry needling;Passive range of motion;Joint Manipulations    PT Next Visit Plan NMES for supraspinatus, infraspinatus, BFR    PT Home Exercise Plan [id number]    Consulted and Agree with Plan of Care Patient           Patient will benefit from skilled therapeutic intervention in order to improve the following deficits and impairments:  Decreased endurance,Decreased activity tolerance,Decreased strength,Impaired UE functional use,Pain,Decreased mobility,Decreased range of motion,Impaired perceived functional ability,Postural dysfunction,Impaired flexibility,Decreased coordination  Visit Diagnosis: Left shoulder pain, unspecified chronicity  Cervicalgia  Muscle weakness (generalized)     Problem List Patient Active Problem List   Diagnosis Date Noted  . Synovitis of left knee 02/09/2020  . S/P cervical spinal fusion 01/03/2020  . GERD (gastroesophageal reflux disease) 03/01/2017  . Right Achilles tendinitis 12/21/2016  . HTN (hypertension) 03/03/2016  . OA (osteoarthritis) of knee  07/10/2013  . Genital herpes 05/02/2013  . CHEST PAIN 08/19/2009  . LOW BACK PAIN 06/03/2009  . ERECTILE DYSFUNCTION 06/07/2008  . HYPERLIPIDEMIA 04/03/2008  . DEPRESSION 04/03/2008  . Allergic rhinitis 04/03/2008  . INSOMNIA 04/03/2008  . NEPHROLITHIASIS, HX OF 04/03/2008    Scot Jun, PT, DPT, OCS, ATC 02/13/20  10:47 AM    [facility] [address] [place], [place], [address] Phone: [phone]   Fax:  [phone]  Name: Dale [name] MRN: [id number] Date of Birth: 1960/10/30

## 2020-02-13 NOTE — Progress Notes (Signed)
Office Visit Note   Patient: Dale [name]           Date of Birth: 07-May-1960           MRN: [id number] Visit Date: 02/13/2020              Requested by: Laurey Morale, MD [place],  [place] [address] PCP: Laurey Morale, MD   Assessment & Plan: Visit Diagnoses:  1. Idiopathic chronic gout of left knee without tophus     Plan: Patient's had previous joint pain polyarthritic symptoms consistent with previous gout.  Aspiration of the knee confirms intracellular gout crystals.  Prescription given for Colcrys that he can use if needed.  We will start him on allopurinol 100 mg and gradually increased over several weeks to 300 mg daily.  Recheck 1 month.  We will repeat uric acid level on return.  We spent time discussing gout and gout treatment today in detail.  Patient is got a brother with gout has other friends of gout as well.  Follow-Up Instructions: Return in about 1 month (around 03/15/2020).   Orders:  No orders of the defined types were placed in this encounter.  Meds ordered this encounter  Medications  . colchicine 0.6 MG tablet    Sig: Take 1 tablet (0.6 mg total) by mouth daily. Take for acute gout one bid to tid    Dispense:  30 tablet    Refill:  1  . allopurinol (ZYLOPRIM) 100 MG tablet    Sig: Take 1 tablet (100 mg total) by mouth daily. Take one daily for 7 days then increase to 2 tablets daily for one week then 3 tablets daily    Dispense:  60 tablet    Refill:  0      Procedures: No procedures performed   Clinical Data: No additional findings.   Subjective: Chief Complaint  Patient presents with  . Left Knee - Follow-up    HPI 60 year old male returns.  I discussed and then that the cloudy fluid from his knee came back showing intracellular monosodium urate crystals consistent with gout in his knee.  Uric acid was 5.4.  Past history of severe gout in his wrist that was treated with prednisone and resolved.  Anti-inflammatories  tends to bother her stomach given diarrhea symptoms.  He has had some discomfort in his shoulder as well past problems with his ankles.  Rest of arthritis panel was normal.  Review of Systems updated unchanged from last visit.   Objective: Vital Signs: BP 139/84   Pulse (!) 123   Ht 5\' 11"  (1.803 m)   Wt 185 lb (83.9 kg)   BMI 25.80 kg/m   Physical Exam Constitutional:      Appearance: He is well-developed and well-nourished.  HENT:     Head: Normocephalic and atraumatic.  Eyes:     Extraocular Movements: EOM normal.     Pupils: Pupils are equal, round, and reactive to light.  Neck:     Thyroid: No thyromegaly.     Trachea: No tracheal deviation.  Cardiovascular:     Rate and Rhythm: Normal rate.  Pulmonary:     Effort: Pulmonary effort is normal.     Breath sounds: No wheezing.  Abdominal:     General: Bowel sounds are normal.     Palpations: Abdomen is soft.  Skin:    General: Skin is warm and dry.     Capillary Refill: Capillary refill takes  less than 2 seconds.  Neurological:     Mental Status: He is alert and oriented to person, place, and time.  Psychiatric:        Mood and Affect: Mood and affect normal.        Behavior: Behavior normal.        Thought Content: Thought content normal.        Judgment: Judgment normal.     Ortho Exam Synovitis left knee is down since cortisone injection.  He is amatory without limping good range of motion of his hip shoulders. Specialty Comments:  No specialty comments available.  Imaging: No results found.   PMFS History: Patient Active Problem List   Diagnosis Date Noted  . Idiopathic chronic gout, unspecified site, without tophus (tophi) 02/14/2020  . Synovitis of left knee 02/09/2020  . S/P cervical spinal fusion 01/03/2020  . GERD (gastroesophageal reflux disease) 03/01/2017  . Right Achilles tendinitis 12/21/2016  . HTN (hypertension) 03/03/2016  . OA (osteoarthritis) of knee 07/10/2013  . Genital herpes  05/02/2013  . CHEST PAIN 08/19/2009  . LOW BACK PAIN 06/03/2009  . ERECTILE DYSFUNCTION 06/07/2008  . HYPERLIPIDEMIA 04/03/2008  . DEPRESSION 04/03/2008  . Allergic rhinitis 04/03/2008  . INSOMNIA 04/03/2008  . NEPHROLITHIASIS, HX OF 04/03/2008   Past Medical History:  Diagnosis Date  . Allergic rhinitis, seasonal   . Anxiety   . Arthritis    back  . Depression   . Genital herpes   . GERD (gastroesophageal reflux disease)   . Gross hematuria   . Hiatal hernia   . History of chest pain    s/p  nuclear stress test, 08-26-2009, normal perfusion without ischemia , ef 58%, stated non-cardiac chest pain  . History of kidney stones   . History of nonmelanoma skin cancer    excision left leg SCC;  excision of chest BCC   . Hyperlipidemia   . Hypertension    followed by pcp   . Nocturia   . Urethral lesion    prostatic  . Wears glasses     Family History  Problem Relation Age of Onset  . Heart attack Father   . Coronary artery disease Father   . Heart disease Father   . Depression Other        family hx  . Hyperlipidemia Other        family hx  . Hypertension Other        family hx  . Lung cancer Other        family hx  . Stroke Other        family hx  . Hypertension Mother   . Hyperlipidemia Mother   . Colon cancer Neg Hx   . Esophageal cancer Neg Hx   . Rectal cancer Neg Hx   . Stomach cancer Neg Hx   . Colon polyps Neg Hx     Past Surgical History:  Procedure Laterality Date  . COLONOSCOPY  last one 07-01-2018  . CYSTOSCOPY N/A 08/23/2019   Procedure: CYSTOSCOPY WITH TRANSURETHRAL BIOPSY;  Surgeon: Ceasar Mons, MD;  Location: [facility];  Service: Urology;  Laterality: N/A;  . ESOPHAGOGASTRODUODENOSCOPY  05/28/2017  . KNEE ARTHROSCOPY Right 01/09/2013   Procedure: ARTHROSCOPY KNEE;  Surgeon: Marybelle Killings, MD;  Location: [facility];  Service: Orthopedics;  Laterality: Right;  Right knee scope, partial medial menisectomy  . KNEE  ARTHROSCOPY Left 2007  . KNEE SURGERY Right 8366;  1989;  1990; 2001; last one @  [facility] 06-10-2000  . [place] SURGERY  2018  . POSTERIOR LUMBAR FUSION  12-21-2011  @[facility]    L4--5  . ROTATOR CUFF REPAIR Left 2003  . TOTAL KNEE ARTHROPLASTY Right 07/10/2013   Procedure: RIGHT TOTAL KNEE ARTHROPLASTY;  Surgeon: Gearlean Alf, MD;  Location: [facility];  Service: Orthopedics;  Laterality: Right;  Marland Kitchen VASECTOMY     Social History   Occupational History  . Not on file  Tobacco Use  . Smoking status: Never Smoker  . Smokeless tobacco: Never Used  Vaping Use  . Vaping Use: Never used  Substance and Sexual Activity  . Alcohol use: Yes    Comment: occasional  . Drug use: Never  . Sexual activity: Yes    Comment: VASECTOMY

## 2020-02-14 DIAGNOSIS — M1A00X0 Idiopathic chronic gout, unspecified site, without tophus (tophi): Secondary | ICD-10-CM | POA: Insufficient documentation

## 2020-02-15 ENCOUNTER — Encounter: Payer: 59 | Admitting: Rehabilitative and Restorative Service Providers"

## 2020-02-19 ENCOUNTER — Other Ambulatory Visit: Payer: Self-pay | Admitting: Family Medicine

## 2020-02-20 ENCOUNTER — Other Ambulatory Visit: Payer: Self-pay

## 2020-02-20 ENCOUNTER — Encounter: Payer: Self-pay | Admitting: Rehabilitative and Restorative Service Providers"

## 2020-02-20 ENCOUNTER — Ambulatory Visit: Payer: 59 | Admitting: Rehabilitative and Restorative Service Providers"

## 2020-02-20 DIAGNOSIS — M542 Cervicalgia: Secondary | ICD-10-CM | POA: Diagnosis not present

## 2020-02-20 DIAGNOSIS — M6281 Muscle weakness (generalized): Secondary | ICD-10-CM | POA: Diagnosis not present

## 2020-02-20 DIAGNOSIS — M25512 Pain in left shoulder: Secondary | ICD-10-CM | POA: Diagnosis not present

## 2020-02-20 NOTE — Therapy (Signed)
[place], [place], [address] Phone: [phone]   Fax:  [phone]  Physical Therapy Treatment  Patient Details  Name: Dale [name] MRN: [id number] Date of Birth: [birth year] Referring Provider (PT): Dr Lorin Mercy   Encounter Date: 02/20/2020   PT End of Session - 02/20/20 1158    Visit Number 4    Number of Visits 20    Date for PT Re-Evaluation 03/26/20    Progress Note Due on Visit 10    PT Start Time 8338    PT Stop Time 1224    PT Time Calculation (min) 40 min    Activity Tolerance Patient tolerated treatment well    Behavior During Therapy [facility] for tasks assessed/performed           Past Medical History:  Diagnosis Date  . Allergic rhinitis, seasonal   . Anxiety   . Arthritis    back  . Depression   . Genital herpes   . GERD (gastroesophageal reflux disease)   . Gross hematuria   . Hiatal hernia   . History of chest pain    s/p  nuclear stress test, 08-26-2009, normal perfusion without ischemia , ef 58%, stated non-cardiac chest pain  . History of kidney stones   . History of nonmelanoma skin cancer    excision left leg SCC;  excision of chest BCC   . Hyperlipidemia   . Hypertension    followed by pcp   . Nocturia   . Urethral lesion    prostatic  . Wears glasses     Past Surgical History:  Procedure Laterality Date  . COLONOSCOPY  last one 07-01-2018  . CYSTOSCOPY N/A 08/23/2019   Procedure: CYSTOSCOPY WITH TRANSURETHRAL BIOPSY;  Surgeon: Ceasar Mons, MD;  Location: [facility];  Service: Urology;  Laterality: N/A;  . ESOPHAGOGASTRODUODENOSCOPY  05/28/2017  . KNEE ARTHROSCOPY Right 01/09/2013   Procedure: ARTHROSCOPY KNEE;  Surgeon: Marybelle Killings, MD;  Location: [facility];  Service: Orthopedics;  Laterality: Right;  Right knee scope, partial medial menisectomy  . KNEE ARTHROSCOPY Left 2007  . KNEE SURGERY Right 2505;  1989;  1990; 2001; last one @ [facility] 06-10-2000  . [place] SURGERY  2018  . POSTERIOR LUMBAR FUSION  12-21-2011  @[facility]    L4--5  . ROTATOR CUFF REPAIR Left 2003  . TOTAL KNEE ARTHROPLASTY Right 07/10/2013   Procedure: RIGHT TOTAL KNEE ARTHROPLASTY;  Surgeon: Gearlean Alf, MD;  Location: [facility];  Service: Orthopedics;  Laterality: Right;  Marland Kitchen VASECTOMY      There were no vitals filed for this visit.   Subjective Assessment - 02/20/20 1159    Subjective Complaints of throbbing pain at night several night noted between shoulder blade and middle thoracic spine.  Pt. did mention feeling like he can use his arm better in reaching things.    Pertinent History RTC repair on Lt 2003ish per Pt.    Limitations Lifting;House hold activities;Other (comment)    Patient Stated Goals Improve Lt arm strength and use    Currently in Pain? No/denies    Pain Location Shoulder    Pain Orientation Left;Posterior    Pain Descriptors / Indicators Throbbing    Pain Type Chronic pain    Pain Onset More than a month ago    Pain Frequency Intermittent    Aggravating Factors  nighttime pain              [facility] PT Assessment -  02/20/20 0001      Assessment   Medical Diagnosis Lt shoulder pain/weakness s/p cervical fusion    Referring Provider (PT) Dr Lorin Mercy    Onset Date/Surgical Date 09/10/19    Hand Dominance Right      Strength   Overall Strength Comments Lt grip:      , SA MMT 5/5 Lt                         [facility] Adult PT Treatment/Exercise - 02/20/20 0001      Shoulder Exercises: Supine   Horizontal ABduction Both;Theraband   3 x 10   Theraband Level (Shoulder Horizontal ABduction) Level 3 (Green)      Shoulder Exercises: Prone   Other Prone Exercises prone y, t x 10 assisted, x 10 unassisted Lt UE      Manual Therapy   Manual therapy comments PAIVM cPA, uPA mid thoracic, regional PA rotation mob to mid thoracic g3-g4                    PT Short Term Goals - 02/07/20 1016      PT SHORT TERM GOAL #1   Title Patient  will demonstrate independent use of home exercise program to maintain progress from in clinic treatments.    Time 3    Period Weeks    Status New    Target Date 02/20/20             PT Long Term Goals - 01/30/20 1139      PT LONG TERM GOAL #1   Title Patient will demonstrate/report pain at worst less than or equal to 2/10 to facilitate minimal limitation in daily activity secondary to pain symptoms.    Time 10    Period Weeks    Status New    Target Date 03/26/20      PT LONG TERM GOAL #2   Title Patient will demonstrate independent use of home exercise program to facilitate ability to maintain/progress functional gains from skilled physical therapy services. 8    Time 10    Period Weeks    Status New    Target Date 03/26/20      PT LONG TERM GOAL #3   Title Pt. will demonstrate Lt GH jt AROM WFL s symptoms to facilitate usual overhead reaching, self care, ressing at PLOF s limitation.    Time 10    Period Weeks    Status New    Target Date 04/09/20      PT LONG TERM GOAL #4   Title Pt. will demonstrate Lt UE MMT > or = 4+/5 to facilitate ability to perform usual daily and workout activity at PLOF.    Time 10    Period Weeks    Status New    Target Date 04/09/20      PT LONG TERM GOAL #5   Title Pt. will demonstrate Lt grip strength 85% of Lt to facilitate improved strength and function.    Time 10    Period Weeks    Status New    Target Date 04/09/20      Additional Long Term Goals   Additional Long Term Goals Yes      PT LONG TERM GOAL #6   Title Pt. will demonstrate FOTO outcome 70% status.    Time 10    Period Weeks    Status New    Target Date 04/09/20  Plan - 02/20/20 1210    Clinical Impression Statement Mid thoracic mobility deficits and concordant pain c PAIVM assessment in cPA, Lt uPA as well as paraspinals trP noted.   Continued strengthening overall indicated and some improvemet noted.  Periscapular strength (middle trap,  lower trap) lacking at this time which can impair elevation in addition to rotator cuff weakness/atrophy.    Personal Factors and Comorbidities Comorbidity 3+    Comorbidities HTN, hyperlipidemia, GERD    Examination-Activity Limitations Bathing;Carry;Lift;Dressing;Reach Overhead    Examination-Participation Restrictions Cleaning;Community Activity;Driving;Yard Work;Other    Stability/Clinical Decision Making Evolving/Moderate complexity    Rehab Potential Fair    PT Frequency 2x / week    PT Treatment/Interventions ADLs/Self Care Home Management;Cryotherapy;Electrical Stimulation;Moist Heat;Balance training;Therapeutic exercise;Iontophoresis 4mg /ml Dexamethasone;Therapeutic activities;Functional mobility training;Stair training;Gait training;Ultrasound;DME Instruction;Neuromuscular re-education;Patient/family education;Manual techniques;Taping;Dry needling;Passive range of motion;Joint Manipulations    PT Next Visit Plan BFR for strengthening.  possible DN for mid thoracic complaints.    PT Home Exercise Plan [email or web address]    Consulted and Agree with Plan of Care Patient           Patient will benefit from skilled therapeutic intervention in order to improve the following deficits and impairments:  Decreased endurance,Decreased activity tolerance,Decreased strength,Impaired UE functional use,Pain,Decreased mobility,Decreased range of motion,Impaired perceived functional ability,Postural dysfunction,Impaired flexibility,Decreased coordination  Visit Diagnosis: Left shoulder pain, unspecified chronicity  Cervicalgia  Muscle weakness (generalized)     Problem List Patient Active Problem List   Diagnosis Date Noted  . Idiopathic chronic gout, unspecified site, without tophus (tophi) 02/14/2020  . Synovitis of left knee 02/09/2020  . S/P cervical spinal fusion 01/03/2020  . GERD (gastroesophageal reflux disease) 03/01/2017  . Right Achilles tendinitis 12/21/2016  . HTN (hypertension)  03/03/2016  . OA (osteoarthritis) of knee 07/10/2013  . Genital herpes 05/02/2013  . CHEST PAIN 08/19/2009  . LOW BACK PAIN 06/03/2009  . ERECTILE DYSFUNCTION 06/07/2008  . HYPERLIPIDEMIA 04/03/2008  . DEPRESSION 04/03/2008  . Allergic rhinitis 04/03/2008  . INSOMNIA 04/03/2008  . NEPHROLITHIASIS, HX OF 04/03/2008   Scot Jun, PT, DPT, OCS, ATC 02/20/20  12:13 PM    [facility] [address] [place], [place], [address] Phone: [phone]   Fax:  [phone]  Name: DANTRE SCHEIRER MRN: [id number] Date of Birth: August 06, 1960

## 2020-02-20 NOTE — Telephone Encounter (Signed)
No recent lipid labs,

## 2020-02-20 NOTE — Patient Instructions (Signed)
Access Code: [email or web address] URL: [email or web address] Date: 02/20/2020 Prepared by: Scot Jun  Exercises Supine Shoulder Horizontal Abduction with Resistance - 2 x daily - 7 x weekly - 10 reps - 3 sets Prone Single Arm Shoulder Y - 2 x daily - 7 x weekly - 3 sets - 10 reps Shoulder External Rotation Reactive Isometrics - 2 x daily - 7 x weekly - 1 sets - 10 reps - 5 hold Standing Shoulder Internal Rotation with Anchored Resistance - 2 x daily - 7 x weekly - 3 sets - 10 reps Standing Bilateral Shoulder Scaption Wall Slide - 2 x daily - 7 x weekly - 3 sets - 10 reps Seated Thoracic Lumbar Extension - 2 x daily - 7 x weekly - 1 sets - 15 reps

## 2020-02-21 ENCOUNTER — Telehealth: Payer: Self-pay

## 2020-02-21 NOTE — Telephone Encounter (Signed)
Spoke to pharmacy tech and they have released the refill.

## 2020-02-21 NOTE — Telephone Encounter (Signed)
[facility] called and stated that the Pt has had Cochicine prescribed by Rodell Perna on 02/13/2020, and is now needing Atorvastatin refilled. This is flagging a drug interaction. They are placing the statin on hold until you give the okay.  The callback number is [phone] then option 2 Reference number [id number]

## 2020-02-21 NOTE — Telephone Encounter (Signed)
Please tell them to fill the statin; he will only use the Colchicine on occasion so any interaction will not be significant

## 2020-02-22 ENCOUNTER — Ambulatory Visit: Payer: 59 | Admitting: Rehabilitative and Restorative Service Providers"

## 2020-02-22 ENCOUNTER — Encounter: Payer: Self-pay | Admitting: Rehabilitative and Restorative Service Providers"

## 2020-02-22 ENCOUNTER — Other Ambulatory Visit: Payer: Self-pay

## 2020-02-22 DIAGNOSIS — M542 Cervicalgia: Secondary | ICD-10-CM

## 2020-02-22 DIAGNOSIS — M25512 Pain in left shoulder: Secondary | ICD-10-CM | POA: Diagnosis not present

## 2020-02-22 DIAGNOSIS — M6281 Muscle weakness (generalized): Secondary | ICD-10-CM

## 2020-02-22 NOTE — Therapy (Signed)
[facility] [place], [place], [address] Phone: [phone]   Fax:  [phone]  Physical Therapy Treatment  Patient Details  Name: Dale [name] MRN: [id number] Date of Birth: 17-Oct-1960 Referring Provider (PT): Dr Lorin Mercy   Encounter Date: 02/22/2020   PT End of Session - 02/22/20 1112    Visit Number 5    Number of Visits 20    Date for PT Re-Evaluation 03/26/20    Progress Note Due on Visit 10    PT Start Time 1106    PT Stop Time 1145    PT Time Calculation (min) 39 min    Activity Tolerance Patient tolerated treatment well    Behavior During Therapy [facility] for tasks assessed/performed           Past Medical History:  Diagnosis Date  . Allergic rhinitis, seasonal   . Anxiety   . Arthritis    back  . Depression   . Genital herpes   . GERD (gastroesophageal reflux disease)   . Gross hematuria   . Hiatal hernia   . History of chest pain    s/p  nuclear stress test, 08-26-2009, normal perfusion without ischemia , ef 58%, stated non-cardiac chest pain  . History of kidney stones   . History of nonmelanoma skin cancer    excision left leg SCC;  excision of chest BCC   . Hyperlipidemia   . Hypertension    followed by pcp   . Nocturia   . Urethral lesion    prostatic  . Wears glasses     Past Surgical History:  Procedure Laterality Date  . COLONOSCOPY  last one 07-01-2018  . CYSTOSCOPY N/A 08/23/2019   Procedure: CYSTOSCOPY WITH TRANSURETHRAL BIOPSY;  Surgeon: Ceasar Mons, MD;  Location: [facility];  Service: Urology;  Laterality: N/A;  . ESOPHAGOGASTRODUODENOSCOPY  05/28/2017  . KNEE ARTHROSCOPY Right 01/09/2013   Procedure: ARTHROSCOPY KNEE;  Surgeon: Marybelle Killings, MD;  Location: [place];  Service: Orthopedics;  Laterality: Right;  Right knee scope, partial medial menisectomy  . KNEE ARTHROSCOPY Left 2007  . KNEE SURGERY Right [id number];  1989;  1990; 2001; last one @ [facility] 06-10-2000  . [place] SURGERY  2018  . POSTERIOR LUMBAR FUSION  12-21-2011  @[facility]    L4--5  . ROTATOR CUFF REPAIR Left 2003  . TOTAL KNEE ARTHROPLASTY Right 07/10/2013   Procedure: RIGHT TOTAL KNEE ARTHROPLASTY;  Surgeon: Gearlean Alf, MD;  Location: [facility];  Service: Orthopedics;  Laterality: Right;  Marland Kitchen VASECTOMY      There were no vitals filed for this visit.   Subjective Assessment - 02/22/20 1111    Subjective Pt. indicated "not too bad" in back complaints since last visit.  Noted mild.    Pertinent History RTC repair on Lt 2003ish per Pt.    Limitations Lifting;House hold activities;Other (comment)    Patient Stated Goals Improve Lt arm strength and use    Currently in Pain? Yes    Pain Score --   mild at night   Pain Location Shoulder    Pain Orientation Left;Posterior    Pain Descriptors / Indicators Throbbing    Pain Onset More than a month ago                             [facility] Adult PT Treatment/Exercise - 02/22/20 0001      BFR Sitting   Sitting  Exercise Pressure (mmHg) 100      Shoulder Exercises: Prone   Other Prone Exercises prone y, t x 10 assisted, 2 x 10 unassisted Lt UE      Shoulder Exercises: Sidelying   External Rotation Strengthening;Left   BFR 100 mmHg 30x, 3 x 15 1 lb   External Rotation Weight (lbs) 1    External Rotation Limitations back against wall    ABduction Strengthening;Left   BFR 100 mmHG 30 x 1 lb, 3 x 15 30 sec rest 0 lb   ABduction Limitations back against wall      Shoulder Exercises: ROM/Strengthening   UBE (Upper Arm Bike) Lvl 3.0 5 mins fwd, reverse 5 mins c BFR 100 mmHg Lt UE      Manual Therapy   Manual therapy comments PAIVM cPA, uPA mid thoracic, regional PA rotation mob to mid thoracic g3-g4                    PT Short Term Goals - 02/07/20 1016      PT SHORT TERM GOAL #1   Title Patient will demonstrate independent use of home exercise program to maintain progress from in clinic treatments.    Time 3    Period  Weeks    Status New    Target Date 02/20/20             PT Long Term Goals - 01/30/20 1139      PT LONG TERM GOAL #1   Title Patient will demonstrate/report pain at worst less than or equal to 2/10 to facilitate minimal limitation in daily activity secondary to pain symptoms.    Time 10    Period Weeks    Status New    Target Date 03/26/20      PT LONG TERM GOAL #2   Title Patient will demonstrate independent use of home exercise program to facilitate ability to maintain/progress functional gains from skilled physical therapy services. 8    Time 10    Period Weeks    Status New    Target Date 03/26/20      PT LONG TERM GOAL #3   Title Pt. will demonstrate Lt GH jt AROM WFL s symptoms to facilitate usual overhead reaching, self care, ressing at PLOF s limitation.    Time 10    Period Weeks    Status New    Target Date 04/09/20      PT LONG TERM GOAL #4   Title Pt. will demonstrate Lt UE MMT > or = 4+/5 to facilitate ability to perform usual daily and workout activity at PLOF.    Time 10    Period Weeks    Status New    Target Date 04/09/20      PT LONG TERM GOAL #5   Title Pt. will demonstrate Lt grip strength 85% of Lt to facilitate improved strength and function.    Time 10    Period Weeks    Status New    Target Date 04/09/20      Additional Long Term Goals   Additional Long Term Goals Yes      PT LONG TERM GOAL #6   Title Pt. will demonstrate FOTO outcome 70% status.    Time 10    Period Weeks    Status New    Target Date 04/09/20                 Plan - 02/22/20 1116  Clinical Impression Statement PAIVM based  interention benefit noted in time since last visit.  Continued strength training indicated at this time to improve Lt UE use and hypertrophy in affeceted musculature.  Fatigue and weakness still evident in scapular and RTC based movements.    Personal Factors and Comorbidities Comorbidity 3+    Comorbidities HTN, hyperlipidemia, GERD     Examination-Activity Limitations Bathing;Carry;Lift;Dressing;Reach Overhead    Examination-Participation Restrictions Cleaning;Community Activity;Driving;Yard Work;Other    Stability/Clinical Decision Making Evolving/Moderate complexity    Rehab Potential Fair    PT Frequency 2x / week    PT Treatment/Interventions ADLs/Self Care Home Management;Cryotherapy;Electrical Stimulation;Moist Heat;Balance training;Therapeutic exercise;Iontophoresis 4mg /ml Dexamethasone;Therapeutic activities;Functional mobility training;Stair training;Gait training;Ultrasound;DME Instruction;Neuromuscular re-education;Patient/family education;Manual techniques;Taping;Dry needling;Passive range of motion;Joint Manipulations    PT Next Visit Plan BFR for strengthening, scapular movement    PT Home Exercise Plan [id number]    Consulted and Agree with Plan of Care Patient           Patient will benefit from skilled therapeutic intervention in order to improve the following deficits and impairments:  Decreased endurance,Decreased activity tolerance,Decreased strength,Impaired UE functional use,Pain,Decreased mobility,Decreased range of motion,Impaired perceived functional ability,Postural dysfunction,Impaired flexibility,Decreased coordination  Visit Diagnosis: Left shoulder pain, unspecified chronicity  Cervicalgia  Muscle weakness (generalized)     Problem List Patient Active Problem List   Diagnosis Date Noted  . Idiopathic chronic gout, unspecified site, without tophus (tophi) 02/14/2020  . Synovitis of left knee 02/09/2020  . S/P cervical spinal fusion 01/03/2020  . GERD (gastroesophageal reflux disease) 03/01/2017  . Right Achilles tendinitis 12/21/2016  . HTN (hypertension) 03/03/2016  . OA (osteoarthritis) of knee 07/10/2013  . Genital herpes 05/02/2013  . CHEST PAIN 08/19/2009  . LOW BACK PAIN 06/03/2009  . ERECTILE DYSFUNCTION 06/07/2008  . HYPERLIPIDEMIA 04/03/2008  . DEPRESSION 04/03/2008  .  Allergic rhinitis 04/03/2008  . INSOMNIA 04/03/2008  . NEPHROLITHIASIS, HX OF 04/03/2008   Scot Jun, PT, DPT, OCS, ATC 02/22/20  11:39 AM    [facility] [address] [place], [place], [address] Phone: [phone]   Fax:  [phone]  Name: Dale [name] MRN: [id number] Date of Birth: 23-Jul-1960

## 2020-02-27 ENCOUNTER — Encounter: Payer: Self-pay | Admitting: Rehabilitative and Restorative Service Providers"

## 2020-02-27 ENCOUNTER — Ambulatory Visit: Payer: 59 | Admitting: Rehabilitative and Restorative Service Providers"

## 2020-02-27 ENCOUNTER — Other Ambulatory Visit: Payer: Self-pay

## 2020-02-27 DIAGNOSIS — M25512 Pain in left shoulder: Secondary | ICD-10-CM

## 2020-02-27 DIAGNOSIS — M6281 Muscle weakness (generalized): Secondary | ICD-10-CM

## 2020-02-27 DIAGNOSIS — M542 Cervicalgia: Secondary | ICD-10-CM | POA: Diagnosis not present

## 2020-02-27 NOTE — Therapy (Signed)
[facility] [address] [place], [place], [address] Phone: [phone]   Fax:  [phone]  Physical Therapy Treatment  Patient Details  Name: Dale [name] MRN: [id number] Date of Birth: [birth year] Referring Provider (PT): Dr Lorin Mercy   Encounter Date: 02/27/2020   PT End of Session - 02/27/20 1115    Visit Number 6    Number of Visits 20    Date for PT Re-Evaluation 03/26/20    Progress Note Due on Visit 10    PT Start Time 1106    PT Stop Time 2863    PT Time Calculation (min) 39 min    Activity Tolerance Patient tolerated treatment well    Behavior During Therapy [facility] for tasks assessed/performed           Past Medical History:  Diagnosis Date   Allergic rhinitis, seasonal    Anxiety    Arthritis    back   Depression    Genital herpes    GERD (gastroesophageal reflux disease)    Gross hematuria    Hiatal hernia    History of chest pain    s/p  nuclear stress test, 08-26-2009, normal perfusion without ischemia , ef 58%, stated non-cardiac chest pain   History of kidney stones    History of nonmelanoma skin cancer    excision left leg SCC;  excision of chest BCC    Hyperlipidemia    Hypertension    followed by pcp    Nocturia    Urethral lesion    prostatic   Wears glasses     Past Surgical History:  Procedure Laterality Date   COLONOSCOPY  last one 07-01-2018   CYSTOSCOPY N/A 08/23/2019   Procedure: CYSTOSCOPY WITH TRANSURETHRAL BIOPSY;  Surgeon: Ceasar Mons, MD;  Location: [facility];  Service: Urology;  Laterality: N/A;   ESOPHAGOGASTRODUODENOSCOPY  05/28/2017   KNEE ARTHROSCOPY Right 01/09/2013   Procedure: ARTHROSCOPY KNEE;  Surgeon: Marybelle Killings, MD;  Location: [place];  Service: Orthopedics;  Laterality: Right;  Right knee scope, partial medial menisectomy   KNEE ARTHROSCOPY Left 2007   KNEE SURGERY Right 1983;  1989;  1990; 2001; last one @ [facility] 06-10-2000   LUMBAR  DISC SURGERY  2018   POSTERIOR LUMBAR FUSION  12-21-2011  @[facility]    L4--5   ROTATOR CUFF REPAIR Left 2003   TOTAL KNEE ARTHROPLASTY Right 07/10/2013   Procedure: RIGHT TOTAL KNEE ARTHROPLASTY;  Surgeon: Gearlean Alf, MD;  Location: [facility];  Service: Orthopedics;  Laterality: Right;   VASECTOMY      There were no vitals filed for this visit.   Subjective Assessment - 02/27/20 1114    Subjective Pt. indicated having ache in back and in shoulder some in last few days, "perhaps weather related."    Pertinent History RTC repair on Lt 2003ish per Pt.    Limitations Lifting;House hold activities;Other (comment)    Patient Stated Goals Improve Lt arm strength and use    Currently in Pain? Yes    Pain Score 2     Pain Location Shoulder    Pain Orientation Left;Posterior    Pain Descriptors / Indicators Throbbing;Aching    Pain Onset More than a month ago                             [facility] Adult PT Treatment/Exercise - 02/27/20 0001      Shoulder Exercises: Standing  Other Standing Exercises Y wall slides c lift off x 10, standing push up c SA 2 x 10    Other Standing Exercises Cues for lat stretch Lt at wall.  Lt UE ER c flexion punch to 90 deg red band 2 x10      Shoulder Exercises: ROM/Strengthening   UBE (Upper Arm Bike) Lvl 3.5 3 mins fwd/back each way    Lat Pull Other (comment)   3 x 15 25 lbs   Cybex Row Other (comment)   3 x 15 35 lbs     Manual Therapy   Manual therapy comments compression Lt infraspintus, T4-T6 Lt paraspinals            Trigger Point Dry Needling - 02/27/20 0001    Consent Given? Yes    Education Handout Provided Previously provided    Muscles Treated Upper Quadrant Infraspinatus   Lt   Other Dry Needling T4-T6 paraspinals, multifidi Lt    Infraspinatus Response Twitch response elicited                  PT Short Term Goals - 02/27/20 1114      PT SHORT TERM GOAL #1   Title Patient will demonstrate independent use of  home exercise program to maintain progress from in clinic treatments.    Time 3    Period Weeks    Status On-going    Target Date 02/20/20             PT Long Term Goals - 01/30/20 1139      PT LONG TERM GOAL #1   Title Patient will demonstrate/report pain at worst less than or equal to 2/10 to facilitate minimal limitation in daily activity secondary to pain symptoms.    Time 10    Period Weeks    Status New    Target Date 03/26/20      PT LONG TERM GOAL #2   Title Patient will demonstrate independent use of home exercise program to facilitate ability to maintain/progress functional gains from skilled physical therapy services. 8    Time 10    Period Weeks    Status New    Target Date 03/26/20      PT LONG TERM GOAL #3   Title Pt. will demonstrate Lt GH jt AROM WFL s symptoms to facilitate usual overhead reaching, self care, ressing at PLOF s limitation.    Time 10    Period Weeks    Status New    Target Date 04/09/20      PT LONG TERM GOAL #4   Title Pt. will demonstrate Lt UE MMT > or = 4+/5 to facilitate ability to perform usual daily and workout activity at PLOF.    Time 10    Period Weeks    Status New    Target Date 04/09/20      PT LONG TERM GOAL #5   Title Pt. will demonstrate Lt grip strength 85% of Lt to facilitate improved strength and function.    Time 10    Period Weeks    Status New    Target Date 04/09/20      Additional Long Term Goals   Additional Long Term Goals Yes      PT LONG TERM GOAL #6   Title Pt. will demonstrate FOTO outcome 70% status.    Time 10    Period Weeks    Status New    Target Date 04/09/20  Plan - 02/27/20 1129    Clinical Impression Statement Myofascial trigger point release techniques performed to help address ache complaints today.  Strong reproduction of concordant symptoms were reported.  Continued strength deficits present in Lt UE c scapular and GH jt movements but Pt. has demonstrated some  quality improvement.    Personal Factors and Comorbidities Comorbidity 3+    Comorbidities HTN, hyperlipidemia, GERD    Examination-Activity Limitations Bathing;Carry;Lift;Dressing;Reach Overhead    Examination-Participation Restrictions Cleaning;Community Activity;Driving;Yard Work;Other    Stability/Clinical Decision Making Evolving/Moderate complexity    Rehab Potential Fair    PT Frequency 2x / week    PT Treatment/Interventions ADLs/Self Care Home Management;Cryotherapy;Electrical Stimulation;Moist Heat;Balance training;Therapeutic exercise;Iontophoresis 4mg /ml Dexamethasone;Therapeutic activities;Functional mobility training;Stair training;Gait training;Ultrasound;DME Instruction;Neuromuscular re-education;Patient/family education;Manual techniques;Taping;Dry needling;Passive range of motion;Joint Manipulations    PT Next Visit Plan Reassess trigger point indications and treat.  Continue scapular strengthening/stabilizations and [place] control.    PT Home Exercise Plan [id number]    Consulted and Agree with Plan of Care Patient           Patient will benefit from skilled therapeutic intervention in order to improve the following deficits and impairments:  Decreased endurance,Decreased activity tolerance,Decreased strength,Impaired UE functional use,Pain,Decreased mobility,Decreased range of motion,Impaired perceived functional ability,Postural dysfunction,Impaired flexibility,Decreased coordination  Visit Diagnosis: Left shoulder pain, unspecified chronicity  Cervicalgia  Muscle weakness (generalized)     Problem List Patient Active Problem List   Diagnosis Date Noted   Idiopathic chronic gout, unspecified site, without tophus (tophi) 02/14/2020   Synovitis of left knee 02/09/2020   S/P cervical spinal fusion 01/03/2020   GERD (gastroesophageal reflux disease) 03/01/2017   Right Achilles tendinitis 12/21/2016   HTN (hypertension) 03/03/2016   OA (osteoarthritis) of knee  07/10/2013   Genital herpes 05/02/2013   CHEST PAIN 08/19/2009   LOW BACK PAIN 06/03/2009   ERECTILE DYSFUNCTION 06/07/2008   HYPERLIPIDEMIA 04/03/2008   DEPRESSION 04/03/2008   Allergic rhinitis 04/03/2008   INSOMNIA 04/03/2008   NEPHROLITHIASIS, HX OF 04/03/2008    Scot Jun, PT, DPT, OCS, ATC 02/27/20  11:42 AM    [facility] [address] [place], [place], [address] Phone: [phone]   Fax:  [phone]  Name: Dale [name] MRN: [id number] Date of Birth: Mar 03, 1960

## 2020-02-29 ENCOUNTER — Encounter: Payer: Self-pay | Admitting: Rehabilitative and Restorative Service Providers"

## 2020-02-29 ENCOUNTER — Ambulatory Visit: Payer: 59 | Admitting: Rehabilitative and Restorative Service Providers"

## 2020-02-29 ENCOUNTER — Other Ambulatory Visit: Payer: Self-pay

## 2020-02-29 DIAGNOSIS — M25512 Pain in left shoulder: Secondary | ICD-10-CM

## 2020-02-29 DIAGNOSIS — M542 Cervicalgia: Secondary | ICD-10-CM

## 2020-02-29 DIAGNOSIS — M6281 Muscle weakness (generalized): Secondary | ICD-10-CM | POA: Diagnosis not present

## 2020-02-29 NOTE — Therapy (Signed)
[place], [place], [address] Phone: [phone]   Fax:  [phone]  Physical Therapy Treatment  Patient Details  Name: Dale [name] MRN: [id number] Date of Birth: 1960-08-09 Referring Provider (PT): Dr Lorin Mercy   Encounter Date: 02/29/2020   PT End of Session - 02/29/20 1113    Visit Number 7    Number of Visits 20    Date for PT Re-Evaluation 03/26/20    Progress Note Due on Visit 10    PT Start Time 1106    PT Stop Time 1145    PT Time Calculation (min) 39 min    Activity Tolerance Patient tolerated treatment well    Behavior During Therapy [facility] for tasks assessed/performed           Past Medical History:  Diagnosis Date  . Allergic rhinitis, seasonal   . Anxiety   . Arthritis    back  . Depression   . Genital herpes   . GERD (gastroesophageal reflux disease)   . Gross hematuria   . Hiatal hernia   . History of chest pain    s/p  nuclear stress test, 08-26-2009, normal perfusion without ischemia , ef 58%, stated non-cardiac chest pain  . History of kidney stones   . History of nonmelanoma skin cancer    excision left leg SCC;  excision of chest BCC   . Hyperlipidemia   . Hypertension    followed by pcp   . Nocturia   . Urethral lesion    prostatic  . Wears glasses     Past Surgical History:  Procedure Laterality Date  . COLONOSCOPY  last one 07-01-2018  . CYSTOSCOPY N/A 08/23/2019   Procedure: CYSTOSCOPY WITH TRANSURETHRAL BIOPSY;  Surgeon: Ceasar Mons, MD;  Location: [facility];  Service: Urology;  Laterality: N/A;  . ESOPHAGOGASTRODUODENOSCOPY  05/28/2017  . KNEE ARTHROSCOPY Right 01/09/2013   Procedure: ARTHROSCOPY KNEE;  Surgeon: Marybelle Killings, MD;  Location: [place];  Service: Orthopedics;  Laterality: Right;  Right knee scope, partial medial menisectomy  . KNEE ARTHROSCOPY Left 2007  . KNEE SURGERY Right 7902;  1989;  1990; 2001; last one @ [facility] 06-10-2000  . [place] SURGERY  2018  . POSTERIOR LUMBAR FUSION  12-21-2011  @[facility]    L4--5  . ROTATOR CUFF REPAIR Left 2003  . TOTAL KNEE ARTHROPLASTY Right 07/10/2013   Procedure: RIGHT TOTAL KNEE ARTHROPLASTY;  Surgeon: Gearlean Alf, MD;  Location: [facility];  Service: Orthopedics;  Laterality: Right;  Marland Kitchen VASECTOMY      There were no vitals filed for this visit.   Subjective Assessment - 02/29/20 1111    Subjective Pt. indicated feeling like he got some reduction of ache in back/shoulder since last visit.  Pt. deferred IMT DN today but said he thought it was helpful.  No pain upon arrival.    Pertinent History RTC repair on Lt 2003ish per Pt.    Limitations Lifting;House hold activities;Other (comment)    Patient Stated Goals Improve Lt arm strength and use    Currently in Pain? No/denies    Pain Onset More than a month ago              [facility] PT Assessment - 02/29/20 0001      Assessment   Medical Diagnosis Lt shoulder pain/weakness s/p cervical fusion    Referring Provider (PT) Dr Lorin Mercy    Onset Date/Surgical Date 09/10/19    Hand Dominance Right  AROM   Overall AROM Comments Forward elevation standing L GH 120 deg c shrug      Strength   Left Shoulder Flexion 3+/5    Left Shoulder ABduction 3-/5    Left Shoulder Internal Rotation 5/5    Left Shoulder External Rotation 3+/5                         [facility] Adult PT Treatment/Exercise - 02/29/20 0001      Blood Flow Restriction   Blood Flow Restriction Yes      BFR Sitting   Sitting Limb Occulsion Pressure (mmHg) 165    Sitting Exercise Prescription Comment cuff size 2      Shoulder Exercises: Sidelying   Flexion Left;Strengthening   BFR 100 mmHg 30, 3 x 15 30 sec breaks   ABduction Left;Strengthening   BFR 100 mmHG 30, 3 x 15 c 30 sec break   ABduction Limitations back against wall      Shoulder Exercises: ROM/Strengthening   UBE (Upper Arm Bike) Lvl 4 5 mins fwd, 30 sec rest, 5 mins backward.  BFR 100 mmHg                     PT Short Term Goals - 02/27/20 1114      PT SHORT TERM GOAL #1   Title Patient will demonstrate independent use of home exercise program to maintain progress from in clinic treatments.    Time 3    Period Weeks    Status On-going    Target Date 02/20/20             PT Long Term Goals - 01/30/20 1139      PT LONG TERM GOAL #1   Title Patient will demonstrate/report pain at worst less than or equal to 2/10 to facilitate minimal limitation in daily activity secondary to pain symptoms.    Time 10    Period Weeks    Status New    Target Date 03/26/20      PT LONG TERM GOAL #2   Title Patient will demonstrate independent use of home exercise program to facilitate ability to maintain/progress functional gains from skilled physical therapy services. 8    Time 10    Period Weeks    Status New    Target Date 03/26/20      PT LONG TERM GOAL #3   Title Pt. will demonstrate Lt GH jt AROM WFL s symptoms to facilitate usual overhead reaching, self care, ressing at PLOF s limitation.    Time 10    Period Weeks    Status New    Target Date 04/09/20      PT LONG TERM GOAL #4   Title Pt. will demonstrate Lt UE MMT > or = 4+/5 to facilitate ability to perform usual daily and workout activity at PLOF.    Time 10    Period Weeks    Status New    Target Date 04/09/20      PT LONG TERM GOAL #5   Title Pt. will demonstrate Lt grip strength 85% of Lt to facilitate improved strength and function.    Time 10    Period Weeks    Status New    Target Date 04/09/20      Additional Long Term Goals   Additional Long Term Goals Yes      PT LONG TERM GOAL #6   Title Pt. will demonstrate FOTO  outcome 70% status.    Time 10    Period Weeks    Status New    Target Date 04/09/20                 Plan - 02/29/20 1113    Clinical Impression Statement Benefits from IMT DN intervention, manual for mobility in thoracic region has shown beneficial for symptom  reduction at various times in clinical treatment cycle.  See objective data for updated MMT for Lt UE, showing some gains compared to evaluation.  Gains have been noted in active movement against gravity but presentation still shows restriction in [facility] jt/thoracic mobility for elevation as well as strength deficits in elevation, abductions movements that functionally limit Pt. at this time.    Personal Factors and Comorbidities Comorbidity 3+    Comorbidities HTN, hyperlipidemia, GERD    Examination-Activity Limitations Bathing;Carry;Lift;Dressing;Reach Overhead    Examination-Participation Restrictions Cleaning;Community Activity;Driving;Yard Work;Other    Stability/Clinical Decision Making Evolving/Moderate complexity    Rehab Potential Fair    PT Frequency 2x / week    PT Treatment/Interventions ADLs/Self Care Home Management;Cryotherapy;Electrical Stimulation;Moist Heat;Balance training;Therapeutic exercise;Iontophoresis 4mg /ml Dexamethasone;Therapeutic activities;Functional mobility training;Stair training;Gait training;Ultrasound;DME Instruction;Neuromuscular re-education;Patient/family education;Manual techniques;Taping;Dry needling;Passive range of motion;Joint Manipulations    PT Next Visit Plan Reassess trigger point indications and treat.  Continue scapular strengthening/stabilizations and GH control, BFR in gravity reduced progression    PT Home Exercise Plan [id number]    Consulted and Agree with Plan of Care Patient           Patient will benefit from skilled therapeutic intervention in order to improve the following deficits and impairments:  Decreased endurance,Decreased activity tolerance,Decreased strength,Impaired UE functional use,Pain,Decreased mobility,Decreased range of motion,Impaired perceived functional ability,Postural dysfunction,Impaired flexibility,Decreased coordination  Visit Diagnosis: Left shoulder pain, unspecified chronicity  Cervicalgia  Muscle weakness  (generalized)     Problem List Patient Active Problem List   Diagnosis Date Noted  . Idiopathic chronic gout, unspecified site, without tophus (tophi) 02/14/2020  . Synovitis of left knee 02/09/2020  . S/P cervical spinal fusion 01/03/2020  . GERD (gastroesophageal reflux disease) 03/01/2017  . Right Achilles tendinitis 12/21/2016  . HTN (hypertension) 03/03/2016  . OA (osteoarthritis) of knee 07/10/2013  . Genital herpes 05/02/2013  . CHEST PAIN 08/19/2009  . LOW BACK PAIN 06/03/2009  . ERECTILE DYSFUNCTION 06/07/2008  . HYPERLIPIDEMIA 04/03/2008  . DEPRESSION 04/03/2008  . Allergic rhinitis 04/03/2008  . INSOMNIA 04/03/2008  . NEPHROLITHIASIS, HX OF 04/03/2008    Scot Jun, PT, DPT, OCS, ATC 02/29/20  11:39 AM    [facility] [address] [place], [place], [address] Phone: [phone]   Fax:  [phone]  Name: Dale [name] MRN: [id number] Date of Birth: 08-07-1960

## 2020-03-05 ENCOUNTER — Encounter: Payer: Self-pay | Admitting: Rehabilitative and Restorative Service Providers"

## 2020-03-05 ENCOUNTER — Ambulatory Visit: Payer: 59 | Admitting: Rehabilitative and Restorative Service Providers"

## 2020-03-05 ENCOUNTER — Other Ambulatory Visit: Payer: Self-pay

## 2020-03-05 DIAGNOSIS — M25512 Pain in left shoulder: Secondary | ICD-10-CM | POA: Diagnosis not present

## 2020-03-05 DIAGNOSIS — M542 Cervicalgia: Secondary | ICD-10-CM | POA: Diagnosis not present

## 2020-03-05 DIAGNOSIS — M6281 Muscle weakness (generalized): Secondary | ICD-10-CM

## 2020-03-05 NOTE — Therapy (Signed)
[place], [place], [address] Phone: [phone]   Fax:  [phone]  Physical Therapy Treatment  Patient Details  Name: KAPIL PETROPOULOS MRN: [id number] Date of Birth: September 21, 1960 Referring Provider (PT): Dr Lorin Mercy   Encounter Date: 03/05/2020   PT End of Session - 03/05/20 1110    Visit Number 8    Number of Visits 20    Date for PT Re-Evaluation 03/26/20    Progress Note Due on Visit 10    PT Start Time 1107    PT Stop Time 1147    PT Time Calculation (min) 40 min    Activity Tolerance Patient tolerated treatment well    Behavior During Therapy [facility] for tasks assessed/performed           Past Medical History:  Diagnosis Date  . Allergic rhinitis, seasonal   . Anxiety   . Arthritis    back  . Depression   . Genital herpes   . GERD (gastroesophageal reflux disease)   . Gross hematuria   . Hiatal hernia   . History of chest pain    s/p  nuclear stress test, 08-26-2009, normal perfusion without ischemia , ef 58%, stated non-cardiac chest pain  . History of kidney stones   . History of nonmelanoma skin cancer    excision left leg SCC;  excision of chest BCC   . Hyperlipidemia   . Hypertension    followed by pcp   . Nocturia   . Urethral lesion    prostatic  . Wears glasses     Past Surgical History:  Procedure Laterality Date  . COLONOSCOPY  last one 07-01-2018  . CYSTOSCOPY N/A 08/23/2019   Procedure: CYSTOSCOPY WITH TRANSURETHRAL BIOPSY;  Surgeon: Ceasar Mons, MD;  Location[facility];  Service: Urology;  Laterality: N/A;  . ESOPHAGOGASTRODUODENOSCOPY  05/28/2017  . KNEE ARTHROSCOPY Right 01/09/2013   Procedure: ARTHROSCOPY KNEE;  Surgeon: Marybelle Killings, MD;  Location: [place];  Service: Orthopedics;  Laterality: Right;  Right knee scope, partial medial menisectomy  . KNEE ARTHROSCOPY Left 2007  . KNEE SURGERY Right 1950;  1989;  1990; 2001; last one @ [facility] 06-10-2000  . [place] SURGERY  2018  . POSTERIOR LUMBAR FUSION  12-21-2011  @[facility]    L4--5  . ROTATOR CUFF REPAIR Left 2003  . TOTAL KNEE ARTHROPLASTY Right 07/10/2013   Procedure: RIGHT TOTAL KNEE ARTHROPLASTY;  Surgeon: Gearlean Alf, MD;  Location: [facility];  Service: Orthopedics;  Laterality: Right;  Marland Kitchen VASECTOMY      There were no vitals filed for this visit.   Subjective Assessment - 03/05/20 1111    Subjective Pt. indicated feeling pretty good overall today.  Pt. stated sometimes at night ache happens in back/shoulder insidiously.    Pertinent History RTC repair on Lt 2003ish per Pt.    Limitations Lifting;House hold activities;Other (comment)    Patient Stated Goals Improve Lt arm strength and use    Currently in Pain? No/denies    Pain Onset More than a month ago                             [facility] Adult PT Treatment/Exercise - 03/05/20 0001      BFR Sitting   Sitting Limb Occulsion Pressure (mmHg) 165    Sitting Exercise Prescription Comment cuff size 2      Neck Exercises: Theraband   Other  Theraband Exercises --    Other Theraband Exercises --      Shoulder Exercises: Standing   Other Standing Exercises seated lat pull down BFR 100 mmHg 30, 3 x 15 30 sec break.  standing UE ranger flexion 1.5 lbs BFR 100 mmHg x 30.  standing flexion to 90 deg c BFR 100 mmHg 3 x 15    Other Standing Exercises standing reactive ER hold walk outs 5 sec x 15 blue, stnading 100 deg flexion 2 lb ball circles 30 x ccw, x30 cw      Shoulder Exercises: ROM/Strengthening   UBE (Upper Arm Bike) Lvl 4.5 5 mins fwd, backwards BFR 100 mmHG                    PT Short Term Goals - 02/27/20 1114      PT SHORT TERM GOAL #1   Title Patient will demonstrate independent use of home exercise program to maintain progress from in clinic treatments.    Time 3    Period Weeks    Status On-going    Target Date 02/20/20             PT Long Term Goals - 01/30/20 1139      PT LONG TERM GOAL  #1   Title Patient will demonstrate/report pain at worst less than or equal to 2/10 to facilitate minimal limitation in daily activity secondary to pain symptoms.    Time 10    Period Weeks    Status New    Target Date 03/26/20      PT LONG TERM GOAL #2   Title Patient will demonstrate independent use of home exercise program to facilitate ability to maintain/progress functional gains from skilled physical therapy services. 8    Time 10    Period Weeks    Status New    Target Date 03/26/20      PT LONG TERM GOAL #3   Title Pt. will demonstrate Lt GH jt AROM WFL s symptoms to facilitate usual overhead reaching, self care, ressing at PLOF s limitation.    Time 10    Period Weeks    Status New    Target Date 04/09/20      PT LONG TERM GOAL #4   Title Pt. will demonstrate Lt UE MMT > or = 4+/5 to facilitate ability to perform usual daily and workout activity at PLOF.    Time 10    Period Weeks    Status New    Target Date 04/09/20      PT LONG TERM GOAL #5   Title Pt. will demonstrate Lt grip strength 85% of Lt to facilitate improved strength and function.    Time 10    Period Weeks    Status New    Target Date 04/09/20      Additional Long Term Goals   Additional Long Term Goals Yes      PT LONG TERM GOAL #6   Title Pt. will demonstrate FOTO outcome 70% status.    Time 10    Period Weeks    Status New    Target Date 04/09/20                 Plan - 03/05/20 1134    Clinical Impression Statement Focus today on combination of resistive static holds, BFR c periscapular strengthening c fatigue noted.  Contiued recommendation on plan for improved strnegth for Lt UE c mixture of traditional progressive resistance  exercises and BFR.    Personal Factors and Comorbidities Comorbidity 3+    Comorbidities HTN, hyperlipidemia, GERD    Examination-Activity Limitations Bathing;Carry;Lift;Dressing;Reach Overhead    Examination-Participation Restrictions Cleaning;Community  Activity;Driving;Yard Work;Other    Stability/Clinical Decision Making Evolving/Moderate complexity    Rehab Potential Fair    PT Frequency 2x / week    PT Treatment/Interventions ADLs/Self Care Home Management;Cryotherapy;Electrical Stimulation;Moist Heat;Balance training;Therapeutic exercise;Iontophoresis 4mg /ml Dexamethasone;Therapeutic activities;Functional mobility training;Stair training;Gait training;Ultrasound;DME Instruction;Neuromuscular re-education;Patient/family education;Manual techniques;Taping;Dry needling;Passive range of motion;Joint Manipulations    PT Next Visit Plan Continue scapular strengthening/stabilizations and GH control, BFR in gravity reduced progression    PT Home Exercise Plan [id number]    Consulted and Agree with Plan of Care Patient           Patient will benefit from skilled therapeutic intervention in order to improve the following deficits and impairments:  Decreased endurance,Decreased activity tolerance,Decreased strength,Impaired UE functional use,Pain,Decreased mobility,Decreased range of motion,Impaired perceived functional ability,Postural dysfunction,Impaired flexibility,Decreased coordination  Visit Diagnosis: Left shoulder pain, unspecified chronicity  Cervicalgia  Muscle weakness (generalized)     Problem List Patient Active Problem List   Diagnosis Date Noted  . Idiopathic chronic gout, unspecified site, without tophus (tophi) 02/14/2020  . Synovitis of left knee 02/09/2020  . S/P cervical spinal fusion 01/03/2020  . GERD (gastroesophageal reflux disease) 03/01/2017  . Right Achilles tendinitis 12/21/2016  . HTN (hypertension) 03/03/2016  . OA (osteoarthritis) of knee 07/10/2013  . Genital herpes 05/02/2013  . CHEST PAIN 08/19/2009  . LOW BACK PAIN 06/03/2009  . ERECTILE DYSFUNCTION 06/07/2008  . HYPERLIPIDEMIA 04/03/2008  . DEPRESSION 04/03/2008  . Allergic rhinitis 04/03/2008  . INSOMNIA 04/03/2008  . NEPHROLITHIASIS, HX OF  04/03/2008    Scot Jun, PT, DPT, OCS, ATC 03/05/20  12:03 PM    [facility] [address] [place], [place], [address] Phone: [phone]   Fax:  [phone]  Name: LOWRY BALA MRN: [id number] Date of Birth: Apr 17, 1960

## 2020-03-07 ENCOUNTER — Encounter: Payer: 59 | Admitting: Rehabilitative and Restorative Service Providers"

## 2020-03-12 ENCOUNTER — Other Ambulatory Visit: Payer: Self-pay | Admitting: Orthopaedic Surgery

## 2020-03-12 ENCOUNTER — Other Ambulatory Visit: Payer: Self-pay

## 2020-03-12 ENCOUNTER — Encounter: Payer: Self-pay | Admitting: Rehabilitative and Restorative Service Providers"

## 2020-03-12 ENCOUNTER — Ambulatory Visit: Payer: 59 | Admitting: Rehabilitative and Restorative Service Providers"

## 2020-03-12 DIAGNOSIS — M6281 Muscle weakness (generalized): Secondary | ICD-10-CM

## 2020-03-12 DIAGNOSIS — M25512 Pain in left shoulder: Secondary | ICD-10-CM | POA: Diagnosis not present

## 2020-03-12 DIAGNOSIS — M542 Cervicalgia: Secondary | ICD-10-CM

## 2020-03-12 NOTE — Telephone Encounter (Signed)
Please advise 

## 2020-03-12 NOTE — Telephone Encounter (Signed)
Please send in the 300mg  allopurinol and call him to let him know to just take one po daily. # 60 refill times 6 thanks

## 2020-03-12 NOTE — Therapy (Signed)
[place], [place], [address] Phone: [phone]   Fax:  [phone]  Physical Therapy Treatment  Patient Details  Name: Dale [name] MRN: [id number] Date of Birth: 1960/07/09 Referring Provider (PT): Dr Lorin Mercy   Encounter Date: 03/12/2020   PT End of Session - 03/12/20 1159    Visit Number 9    Number of Visits 20    Date for PT Re-Evaluation 03/26/20    Progress Note Due on Visit 10    PT Start Time 1142    PT Stop Time 1222    PT Time Calculation (min) 40 min    Activity Tolerance Patient tolerated treatment well    Behavior During Therapy [facility] for tasks assessed/performed           Past Medical History:  Diagnosis Date  . Allergic rhinitis, seasonal   . Anxiety   . Arthritis    back  . Depression   . Genital herpes   . GERD (gastroesophageal reflux disease)   . Gross hematuria   . Hiatal hernia   . History of chest pain    s/p  nuclear stress test, 08-26-2009, normal perfusion without ischemia , ef 58%, stated non-cardiac chest pain  . History of kidney stones   . History of nonmelanoma skin cancer    excision left leg SCC;  excision of chest BCC   . Hyperlipidemia   . Hypertension    followed by pcp   . Nocturia   . Urethral lesion    prostatic  . Wears glasses     Past Surgical History:  Procedure Laterality Date  . COLONOSCOPY  last one 07-01-2018  . CYSTOSCOPY N/A 08/23/2019   Procedure: CYSTOSCOPY WITH TRANSURETHRAL BIOPSY;  Surgeon: Ceasar Mons, MD;  Location: [facility];  Service: Urology;  Laterality: N/A;  . ESOPHAGOGASTRODUODENOSCOPY  05/28/2017  . KNEE ARTHROSCOPY Right 01/09/2013   Procedure: ARTHROSCOPY KNEE;  Surgeon: Marybelle Killings, MD;  Location: [place];  Service: Orthopedics;  Laterality: Right;  Right knee scope, partial medial menisectomy  . KNEE ARTHROSCOPY Left 2007  . KNEE SURGERY Right 9622;  1989;  1990; 2001; last one @ [facility] 06-10-2000  . [place] SURGERY  2018  . POSTERIOR LUMBAR FUSION  12-21-2011  @[facility]    L4--5  . ROTATOR CUFF REPAIR Left 2003  . TOTAL KNEE ARTHROPLASTY Right 07/10/2013   Procedure: RIGHT TOTAL KNEE ARTHROPLASTY;  Surgeon: Gearlean Alf, MD;  Location: [facility];  Service: Orthopedics;  Laterality: Right;  Marland Kitchen VASECTOMY      There were no vitals filed for this visit.   Subjective Assessment - 03/12/20 1148    Subjective Pt. stated off and on insidious posterior shoulder ache still happens, noted last night in sleeping.  Pt. stated he didn't do anything specific for symptoms.    Pertinent History RTC repair on Lt 2003ish per Pt.    Limitations Lifting;House hold activities;Other (comment)    Patient Stated Goals Improve Lt arm strength and use    Currently in Pain? No/denies   none upon arrival   Pain Score 0-No pain    Pain Location Shoulder    Pain Orientation Left;Posterior    Pain Descriptors / Indicators Throbbing;Aching    Pain Type Chronic pain    Pain Onset More than a month ago    Pain Frequency Occasional    Aggravating Factors  insidious onset, nighttime still noted    Pain Relieving Factors medicine at  times, Pt. reported he usually doesn't do anything specific.              [facility] PT Assessment - 03/12/20 0001      Assessment   Medical Diagnosis Lt shoulder pain/weakness s/p cervical fusion    Referring Provider (PT) Dr Lorin Mercy    Onset Date/Surgical Date 09/10/19    Hand Dominance Right      AROM   Overall AROM Comments Forward elevation WFL today, very mild shrug in last 15 %      Strength   Overall Strength Comments Lt grip strength 2 trial average 63.5 lbs                         [facility] Adult PT Treatment/Exercise - 03/12/20 0001      BFR Sitting   Sitting Exercise Prescription Comment cuff size 2      Exercises   Exercises Shoulder;Neck;Other Exercises    Other Exercises  165      Shoulder Exercises: Prone   Other Prone Exercises prone y, t 2 x 10 each Lt  UE      Shoulder Exercises: Sidelying   ABduction Left;Strengthening   BFR 100 mmHg 30x, 3 x 15     Shoulder Exercises: ROM/Strengthening   UBE (Upper Arm Bike) Lvl 1.5 Lt UE only 5 mins fwd/back each way, BFR 100 mmHg                    PT Short Term Goals - 02/27/20 1114      PT SHORT TERM GOAL #1   Title Patient will demonstrate independent use of home exercise program to maintain progress from in clinic treatments.    Time 3    Period Weeks    Status On-going    Target Date 02/20/20             PT Long Term Goals - 03/12/20 1152      PT LONG TERM GOAL #1   Title Patient will demonstrate/report pain at worst less than or equal to 2/10 to facilitate minimal limitation in daily activity secondary to pain symptoms.    Time 10    Period Weeks    Status On-going    Target Date 04/09/20      PT LONG TERM GOAL #2   Title Patient will demonstrate independent use of home exercise program to facilitate ability to maintain/progress functional gains from skilled physical therapy services. 8    Time 10    Period Weeks    Status On-going    Target Date 04/09/20      PT LONG TERM GOAL #3   Title Pt. will demonstrate Lt GH jt AROM WFL s symptoms to facilitate usual overhead reaching, self care, ressing at PLOF s limitation.    Time 10    Period Weeks    Status On-going    Target Date 04/09/20      PT LONG TERM GOAL #4   Title Pt. will demonstrate Lt UE MMT > or = 4+/5 to facilitate ability to perform usual daily and workout activity at PLOF.    Time 10    Period Weeks    Status On-going    Target Date 04/09/20      PT LONG TERM GOAL #5   Title Pt. will demonstrate Lt grip strength 85% of Lt to facilitate improved strength and function.    Time 10    Period Weeks  Status On-going    Target Date 04/09/20      PT LONG TERM GOAL #6   Title Pt. will demonstrate FOTO outcome 70% status.    Time 10    Period Weeks    Status On-going    Target Date 04/09/20                  Plan - 03/12/20 1202    Clinical Impression Statement Quality of elevation continued to show improvement, strength deficits still noted but slowly progressing.  Measured grip strength today c increased force compared to evaluatio noted.    Personal Factors and Comorbidities Comorbidity 3+    Comorbidities HTN, hyperlipidemia, GERD    Examination-Activity Limitations Bathing;Carry;Lift;Dressing;Reach Overhead    Examination-Participation Restrictions Cleaning;Community Activity;Driving;Yard Work;Other    Stability/Clinical Decision Making Evolving/Moderate complexity    Rehab Potential Fair    PT Frequency 2x / week    PT Treatment/Interventions ADLs/Self Care Home Management;Cryotherapy;Electrical Stimulation;Moist Heat;Balance training;Therapeutic exercise;Iontophoresis 4mg /ml Dexamethasone;Therapeutic activities;Functional mobility training;Stair training;Gait training;Ultrasound;DME Instruction;Neuromuscular re-education;Patient/family education;Manual techniques;Taping;Dry needling;Passive range of motion;Joint Manipulations    PT Next Visit Plan Full progress note next visit, FOTO    PT Home Exercise Plan [id number]    Consulted and Agree with Plan of Care Patient           Patient will benefit from skilled therapeutic intervention in order to improve the following deficits and impairments:  Decreased endurance,Decreased activity tolerance,Decreased strength,Impaired UE functional use,Pain,Decreased mobility,Decreased range of motion,Impaired perceived functional ability,Postural dysfunction,Impaired flexibility,Decreased coordination  Visit Diagnosis: Left shoulder pain, unspecified chronicity  Cervicalgia  Muscle weakness (generalized)     Problem List Patient Active Problem List   Diagnosis Date Noted  . Idiopathic chronic gout, unspecified site, without tophus (tophi) 02/14/2020  . Synovitis of left knee 02/09/2020  . S/P cervical spinal fusion  01/03/2020  . GERD (gastroesophageal reflux disease) 03/01/2017  . Right Achilles tendinitis 12/21/2016  . HTN (hypertension) 03/03/2016  . OA (osteoarthritis) of knee 07/10/2013  . Genital herpes 05/02/2013  . CHEST PAIN 08/19/2009  . LOW BACK PAIN 06/03/2009  . ERECTILE DYSFUNCTION 06/07/2008  . HYPERLIPIDEMIA 04/03/2008  . DEPRESSION 04/03/2008  . Allergic rhinitis 04/03/2008  . INSOMNIA 04/03/2008  . NEPHROLITHIASIS, HX OF 04/03/2008   Scot Jun, PT, DPT, OCS, ATC 03/12/20  12:16 PM    [facility] [address] [place], [place], [address] Phone: [phone]   Fax:  [phone]  Name: KYNGSTON PICKELSIMER MRN: [id number] Date of Birth: [birth year]

## 2020-03-13 NOTE — Telephone Encounter (Signed)
Sent to pharmacy. I called patient and advised. 

## 2020-03-14 ENCOUNTER — Other Ambulatory Visit: Payer: Self-pay

## 2020-03-14 ENCOUNTER — Ambulatory Visit: Payer: 59 | Admitting: Rehabilitative and Restorative Service Providers"

## 2020-03-14 ENCOUNTER — Encounter: Payer: Self-pay | Admitting: Rehabilitative and Restorative Service Providers"

## 2020-03-14 DIAGNOSIS — M542 Cervicalgia: Secondary | ICD-10-CM

## 2020-03-14 DIAGNOSIS — M6281 Muscle weakness (generalized): Secondary | ICD-10-CM | POA: Diagnosis not present

## 2020-03-14 DIAGNOSIS — M25512 Pain in left shoulder: Secondary | ICD-10-CM | POA: Diagnosis not present

## 2020-03-14 NOTE — Therapy (Signed)
[place], [place], [address] Phone: [phone]   Fax:  [phone]  Physical Therapy Treatment/Progress Note/Recertification Patient Details  Name: Dale [name] MRN: [id number] Date of Birth: 11-09-1960 Referring Provider (PT): Dr Lorin Mercy   Encounter Date: 03/14/2020   Progress Note Reporting Period 01/30/2020 to 03/14/2020  See note below for Objective Data and Assessment of Progress/Goals.        PT End of Session - 03/14/20 1213    Visit Number 10    Number of Visits 20    Date for PT Re-Evaluation 04/18/20    Progress Note Due on Visit 20    PT Start Time 1145    PT Stop Time 1225    PT Time Calculation (min) 40 min    Activity Tolerance Patient tolerated treatment well    Behavior During Therapy WFL for tasks assessed/performed           Past Medical History:  Diagnosis Date  . Allergic rhinitis, seasonal   . Anxiety   . Arthritis    back  . Depression   . Genital herpes   . GERD (gastroesophageal reflux disease)   . Gross hematuria   . Hiatal hernia   . History of chest pain    s/p  nuclear stress test, 08-26-2009, normal perfusion without ischemia , ef 58%, stated non-cardiac chest pain  . History of kidney stones   . History of nonmelanoma skin cancer    excision left leg SCC;  excision of chest BCC   . Hyperlipidemia   . Hypertension    followed by pcp   . Nocturia   . Urethral lesion    prostatic  . Wears glasses     Past Surgical History:  Procedure Laterality Date  . COLONOSCOPY  last one 07-01-2018  . CYSTOSCOPY N/A 08/23/2019   Procedure: CYSTOSCOPY WITH TRANSURETHRAL BIOPSY;  Surgeon: Ceasar Mons, MD;  Location: [facility];  Service: Urology;  Laterality: N/A;  . ESOPHAGOGASTRODUODENOSCOPY  05/28/2017  . KNEE ARTHROSCOPY Right 01/09/2013   Procedure: ARTHROSCOPY KNEE;  Surgeon: Marybelle Killings, MD;  Location: [place];  Service: Orthopedics;  Laterality:  Right;  Right knee scope, partial medial menisectomy  . KNEE ARTHROSCOPY Left 2007  . KNEE SURGERY Right [id number];  1989;  1990; 2001; last one @ [facility] 06-10-2000  . [place] SURGERY  2018  . POSTERIOR LUMBAR FUSION  12-21-2011  @[facility]    L4--5  . ROTATOR CUFF REPAIR Left 2003  . TOTAL KNEE ARTHROPLASTY Right 07/10/2013   Procedure: RIGHT TOTAL KNEE ARTHROPLASTY;  Surgeon: Gearlean Alf, MD;  Location: [facility];  Service: Orthopedics;  Laterality: Right;  Marland Kitchen VASECTOMY      There were no vitals filed for this visit.   Subjective Assessment - 03/14/20 1155    Subjective Pt. indicated ache in back of shoulder/upper back still present at various levels but some improvement compared to last week or so.  Pt. also mentioned getting pull up on pants is hard on that side with some improvement overall since start.    Pertinent History RTC repair on Lt 2003ish per Pt.    Limitations Lifting;House hold activities;Other (comment)    Patient Stated Goals Improve Lt arm strength and use    Currently in Pain? No/denies   mild ache reported but denied pain   Pain Location Shoulder    Pain Orientation Left;Posterior    Pain Descriptors / Indicators Aching;Sore    Pain Type  Chronic pain    Pain Onset More than a month ago              [facility] PT Assessment - 03/14/20 0001      Assessment   Medical Diagnosis Lt shoulder pain/weakness s/p cervical fusion    Referring Provider (PT) Dr Lorin Mercy    Onset Date/Surgical Date 09/10/19    Hand Dominance Right      Observation/Other Assessments   Focus on Therapeutic Outcomes (FOTO)  57 % on update      AROM   Left Shoulder Flexion 135 Degrees    Left Shoulder ABduction 125 Degrees    Left Shoulder Internal Rotation 75 Degrees    Left Shoulder External Rotation 75 Degrees      Strength   Left Shoulder Flexion 3+/5    Left Shoulder ABduction 3-/5    Left Shoulder Internal Rotation 5/5    Left Shoulder External Rotation 4/5                          [facility] Adult PT Treatment/Exercise - 03/14/20 0001      Shoulder Exercises: Supine   Protraction Left   5 sec hold 2 x 10   Protraction Weight (lbs) 5    Other Supine Exercises 90 deg flexion circles small cw, ccw 20x each way 5 lbs      Shoulder Exercises: Standing   Flexion Strengthening;Both    Shoulder Flexion Weight (lbs) 1    Extension Strengthening;Both   3 x 15   Theraband Level (Shoulder Extension) Level 4 (Blue)    Row Both   3 x 15   Theraband Level (Shoulder Row) Level 4 (Blue)    Other Standing Exercises standing 3 lb bar lift up back x 10 (2 sec hold)      Manual Therapy   Manual therapy comments cPA T3-T8 g4, regional PA mobs mid thoracic through facet joints g4                    PT Short Term Goals - 02/27/20 1114      PT SHORT TERM GOAL #1   Title Patient will demonstrate independent use of home exercise program to maintain progress from in clinic treatments.    Time 3    Period Weeks    Status On-going    Target Date 02/20/20             PT Long Term Goals - 03/14/20 1220      PT LONG TERM GOAL #1   Title Patient will demonstrate/report pain at worst less than or equal to 2/10 to facilitate minimal limitation in daily activity secondary to pain symptoms.    Time 5    Period Weeks    Status Revised    Target Date 04/18/20      PT LONG TERM GOAL #2   Title Patient will demonstrate independent use of home exercise program to facilitate ability to maintain/progress functional gains from skilled physical therapy services.    Time 5    Period Weeks    Status Revised    Target Date 04/18/20      PT LONG TERM GOAL #3   Title Pt. will demonstrate Lt [place] jt AROM WFL s symptoms to facilitate usual overhead reaching, self care, ressing at PLOF s limitation.    Time 5    Period Weeks    Status Revised    Target Date 04/18/20  PT LONG TERM GOAL #4   Title Pt. will demonstrate Lt UE MMT > or = 4+/5 to  facilitate ability to perform usual daily and workout activity at PLOF.    Time 5    Period Weeks    Status Revised    Target Date 04/18/20      PT LONG TERM GOAL #5   Title Pt. will demonstrate Lt grip strength 85% of Lt to facilitate improved strength and function.    Time 5    Period Weeks    Status Revised    Target Date 04/18/20      PT LONG TERM GOAL #6   Title Pt. will demonstrate FOTO outcome 70% status.    Time 5    Period Weeks    Status Revised    Target Date 04/18/20                 Plan - 03/14/20 1208    Clinical Impression Statement Pt. has attended 10 visits overall during course of treatment.  See objective data for updated information.  Gains noted to this point but impairment still noted in strength.  Thoracic mobility impairments still evident in variable degree and most likely connected to mid thoracic/posterior shoulder ache/stiffness complaints.  Continued strengthening for Lt UE performed and pushed to point of muscle fatigue to promote functional gains in elevation.  Ability to perform > 125 deg forward elevation today against gravity without shrug.  Transition from sidelying to standing elevations in clinic today c good performance.  Continued skilled PT services indicated at this time to continue progression.    Personal Factors and Comorbidities Comorbidity 3+    Comorbidities HTN, hyperlipidemia, GERD    Examination-Activity Limitations Bathing;Carry;Lift;Dressing;Reach Overhead    Examination-Participation Restrictions Cleaning;Community Activity;Driving;Yard Work;Other    Stability/Clinical Decision Making Evolving/Moderate complexity    Rehab Potential Fair    PT Frequency 2x / week    PT Treatment/Interventions ADLs/Self Care Home Management;Cryotherapy;Electrical Stimulation;Moist Heat;Balance training;Therapeutic exercise;Iontophoresis 4mg /ml Dexamethasone;Therapeutic activities;Functional mobility training;Stair training;Gait  training;Ultrasound;DME Instruction;Neuromuscular re-education;Patient/family education;Manual techniques;Taping;Dry needling;Passive range of motion;Joint Manipulations    PT Next Visit Plan Continue strengthening in scapular and GH musculature c BFR use, progression towards gravity based restrictions    PT Home Exercise Plan [id number]    Consulted and Agree with Plan of Care Patient           Patient will benefit from skilled therapeutic intervention in order to improve the following deficits and impairments:  Decreased endurance,Decreased activity tolerance,Decreased strength,Impaired UE functional use,Pain,Decreased mobility,Decreased range of motion,Impaired perceived functional ability,Postural dysfunction,Impaired flexibility,Decreased coordination  Visit Diagnosis: Left shoulder pain, unspecified chronicity  Cervicalgia  Muscle weakness (generalized)     Problem List Patient Active Problem List   Diagnosis Date Noted  . Idiopathic chronic gout, unspecified site, without tophus (tophi) 02/14/2020  . Synovitis of left knee 02/09/2020  . S/P cervical spinal fusion 01/03/2020  . GERD (gastroesophageal reflux disease) 03/01/2017  . Right Achilles tendinitis 12/21/2016  . HTN (hypertension) 03/03/2016  . OA (osteoarthritis) of knee 07/10/2013  . Genital herpes 05/02/2013  . CHEST PAIN 08/19/2009  . LOW BACK PAIN 06/03/2009  . ERECTILE DYSFUNCTION 06/07/2008  . HYPERLIPIDEMIA 04/03/2008  . DEPRESSION 04/03/2008  . Allergic rhinitis 04/03/2008  . INSOMNIA 04/03/2008  . NEPHROLITHIASIS, HX OF 04/03/2008    Scot Jun, PT, DPT, OCS, ATC 03/14/20  12:23 PM    [facility] [address] [place], [place], [address] Phone: [phone]   Fax:  [phone]  Name: Dale [name] MRN: [id number] Date of Birth: [birth year]

## 2020-03-19 ENCOUNTER — Other Ambulatory Visit: Payer: Self-pay

## 2020-03-19 ENCOUNTER — Ambulatory Visit: Payer: 59 | Admitting: Rehabilitative and Restorative Service Providers"

## 2020-03-19 ENCOUNTER — Encounter: Payer: Self-pay | Admitting: Rehabilitative and Restorative Service Providers"

## 2020-03-19 DIAGNOSIS — M6281 Muscle weakness (generalized): Secondary | ICD-10-CM | POA: Diagnosis not present

## 2020-03-19 DIAGNOSIS — M542 Cervicalgia: Secondary | ICD-10-CM | POA: Diagnosis not present

## 2020-03-19 DIAGNOSIS — M25512 Pain in left shoulder: Secondary | ICD-10-CM

## 2020-03-19 NOTE — Therapy (Signed)
[place], [place], [address] Phone: [phone]   Fax:  [phone]  Physical Therapy Treatment  Patient Details  Name: Dale [name] MRN: [id number] Date of Birth: [birth year] Referring Provider (PT): Dr Lorin Mercy   Encounter Date: 03/19/2020   PT End of Session - 03/19/20 1152    Visit Number 11    Number of Visits 20    Date for PT Re-Evaluation 04/18/20    Progress Note Due on Visit 20    PT Start Time 1144    PT Stop Time 1224    PT Time Calculation (min) 40 min    Activity Tolerance Patient tolerated treatment well    Behavior During Therapy [facility] for tasks assessed/performed           Past Medical History:  Diagnosis Date  . Allergic rhinitis, seasonal   . Anxiety   . Arthritis    back  . Depression   . Genital herpes   . GERD (gastroesophageal reflux disease)   . Gross hematuria   . Hiatal hernia   . History of chest pain    s/p  nuclear stress test, 08-26-2009, normal perfusion without ischemia , ef 58%, stated non-cardiac chest pain  . History of kidney stones   . History of nonmelanoma skin cancer    excision left leg SCC;  excision of chest BCC   . Hyperlipidemia   . Hypertension    followed by pcp   . Nocturia   . Urethral lesion    prostatic  . Wears glasses     Past Surgical History:  Procedure Laterality Date  . COLONOSCOPY  last one 07-01-2018  . CYSTOSCOPY N/A 08/23/2019   Procedure: CYSTOSCOPY WITH TRANSURETHRAL BIOPSY;  Surgeon: Ceasar Mons, MD;  Location: [facility];  Service: Urology;  Laterality: N/A;  . ESOPHAGOGASTRODUODENOSCOPY  05/28/2017  . KNEE ARTHROSCOPY Right 01/09/2013   Procedure: ARTHROSCOPY KNEE;  Surgeon: Marybelle Killings, MD;  Location: [place];  Service: Orthopedics;  Laterality: Right;  Right knee scope, partial medial menisectomy  . KNEE ARTHROSCOPY Left 2007  . KNEE SURGERY Right 7510;  1989;  1990; 2001; last one @ [facility] 06-10-2000  . [place] SURGERY  2018  . POSTERIOR LUMBAR FUSION  12-21-2011  @[facility]    L4--5  . ROTATOR CUFF REPAIR Left 2003  . TOTAL KNEE ARTHROPLASTY Right 07/10/2013   Procedure: RIGHT TOTAL KNEE ARTHROPLASTY;  Surgeon: Gearlean Alf, MD;  Location: [facility];  Service: Orthopedics;  Laterality: Right;  Marland Kitchen VASECTOMY      There were no vitals filed for this visit.   Subjective Assessment - 03/19/20 1152    Subjective Pt. stated ache in back was better but still noted c sneezing and sometimes in morning.  Pt. stated sometimes Lt of neck can be irritated.    Pertinent History RTC repair on Lt 2003ish per Pt.    Limitations Lifting;House hold activities;Other (comment)    Patient Stated Goals Improve Lt arm strength and use    Currently in Pain? No/denies    Pain Onset More than a month ago                             [facility] Adult PT Treatment/Exercise - 03/19/20 0001      Blood Flow Restriction-Positions    Blood Flow Restriction Position Standing      BFR Sitting   Sitting Limb Occulsion  Pressure (mmHg) 160    Sitting Exercise Prescription Comment cuff size 2      Shoulder Exercises: Prone   External Rotation Left   2 x 15 c cues, tactile cues to prevent compensatory movement   Other Prone Exercises prone y, t 3 x 15 each      Shoulder Exercises: Standing   Flexion Strengthening;Both   BFR on Lt 100 mmHg x 30, 3 x 15 30 sec breaks.  both arms to prevent leaning   Other Standing Exercises standing wall push up c SA press 2 sec hold 3 x 10 c BFR 100 mmHg    Other Standing Exercises 2 lb ball circles in flexion, scaption (90 deg)  30 cw, ccw circles each way each position      Manual Therapy   Manual therapy comments cPA T3-T8 g4, regional PA mobs mid thoracic through facet joints g4                    PT Short Term Goals - 02/27/20 1114      PT SHORT TERM GOAL #1   Title Patient will demonstrate independent use of home exercise program to maintain progress from in  clinic treatments.    Time 3    Period Weeks    Status On-going    Target Date 02/20/20             PT Long Term Goals - 03/14/20 1220      PT LONG TERM GOAL #1   Title Patient will demonstrate/report pain at worst less than or equal to 2/10 to facilitate minimal limitation in daily activity secondary to pain symptoms.    Time 5    Period Weeks    Status Revised    Target Date 04/18/20      PT LONG TERM GOAL #2   Title Patient will demonstrate independent use of home exercise program to facilitate ability to maintain/progress functional gains from skilled physical therapy services.    Time 5    Period Weeks    Status Revised    Target Date 04/18/20      PT LONG TERM GOAL #3   Title Pt. will demonstrate Lt [place] jt AROM WFL s symptoms to facilitate usual overhead reaching, self care, ressing at PLOF s limitation.    Time 5    Period Weeks    Status Revised    Target Date 04/18/20      PT LONG TERM GOAL #4   Title Pt. will demonstrate Lt UE MMT > or = 4+/5 to facilitate ability to perform usual daily and workout activity at PLOF.    Time 5    Period Weeks    Status Revised    Target Date 04/18/20      PT LONG TERM GOAL #5   Title Pt. will demonstrate Lt grip strength 85% of Lt to facilitate improved strength and function.    Time 5    Period Weeks    Status Revised    Target Date 04/18/20      PT LONG TERM GOAL #6   Title Pt. will demonstrate FOTO outcome 70% status.    Time 5    Period Weeks    Status Revised    Target Date 04/18/20                 Plan - 03/19/20 1207    Clinical Impression Statement Pt. is making slow but steady progress in prone lower  trap/middle strap strengthening compared to previous weeks.  Pt. is showing gains in quality of elevation movement against gravity.  Atrophy still noted and fatigue onset easier than normal side.  Continued skilled PT services indicated at this time.    Personal Factors and Comorbidities Comorbidity 3+     Comorbidities HTN, hyperlipidemia, GERD    Examination-Activity Limitations Bathing;Carry;Lift;Dressing;Reach Overhead    Examination-Participation Restrictions Cleaning;Community Activity;Driving;Yard Work;Other    Stability/Clinical Decision Making Evolving/Moderate complexity    Rehab Potential Fair    PT Frequency 2x / week    PT Treatment/Interventions ADLs/Self Care Home Management;Cryotherapy;Electrical Stimulation;Moist Heat;Balance training;Therapeutic exercise;Iontophoresis 4mg /ml Dexamethasone;Therapeutic activities;Functional mobility training;Stair training;Gait training;Ultrasound;DME Instruction;Neuromuscular re-education;Patient/family education;Manual techniques;Taping;Dry needling;Passive range of motion;Joint Manipulations    PT Next Visit Plan Continue strengthening in scapular and GH musculature c BFR use    PT Home Exercise Plan [email or web address]    Consulted and Agree with Plan of Care Patient           Patient will benefit from skilled therapeutic intervention in order to improve the following deficits and impairments:  Decreased endurance,Decreased activity tolerance,Decreased strength,Impaired UE functional use,Pain,Decreased mobility,Decreased range of motion,Impaired perceived functional ability,Postural dysfunction,Impaired flexibility,Decreased coordination  Visit Diagnosis: Left shoulder pain, unspecified chronicity  Cervicalgia  Muscle weakness (generalized)     Problem List Patient Active Problem List   Diagnosis Date Noted  . Idiopathic chronic gout, unspecified site, without tophus (tophi) 02/14/2020  . Synovitis of left knee 02/09/2020  . S/P cervical spinal fusion 01/03/2020  . GERD (gastroesophageal reflux disease) 03/01/2017  . Right Achilles tendinitis 12/21/2016  . HTN (hypertension) 03/03/2016  . OA (osteoarthritis) of knee 07/10/2013  . Genital herpes 05/02/2013  . CHEST PAIN 08/19/2009  . LOW BACK PAIN 06/03/2009  . ERECTILE DYSFUNCTION  06/07/2008  . HYPERLIPIDEMIA 04/03/2008  . DEPRESSION 04/03/2008  . Allergic rhinitis 04/03/2008  . INSOMNIA 04/03/2008  . NEPHROLITHIASIS, HX OF 04/03/2008   Scot Jun, PT, DPT, OCS, ATC 03/19/20  12:46 PM    [facility] Physical Therapy [address] [place], [place], [address] Phone: [phone]   Fax:  [phone]  Name: IZAYAH MINER MRN: [id number] Date of Birth: December 18, 1960

## 2020-03-19 NOTE — Patient Instructions (Signed)
Access Code: [email or web address] URL: [email or web address] Date: 03/19/2020 Prepared by: Scot Jun  Exercises Supine Shoulder Horizontal Abduction with Resistance - 2 x daily - 7 x weekly - 10 reps - 3 sets Prone Single Arm Shoulder Y - 2 x daily - 7 x weekly - 3 sets - 10 reps Shoulder External Rotation Reactive Isometrics - 2 x daily - 7 x weekly - 1 sets - 10 reps - 5 hold Standing Shoulder Internal Rotation with Anchored Resistance - 2 x daily - 7 x weekly - 3 sets - 10 reps Standing Bilateral Shoulder Scaption Wall Slide - 2 x daily - 7 x weekly - 3 sets - 10 reps Seated Thoracic Lumbar Extension - 2 x daily - 7 x weekly - 1 sets - 15 reps Standing Shoulder Flexion to 90 Degrees with Dumbbells - 1 x daily - 7 x weekly - 3 sets - 10-15 reps Scaption with Dumbbells - 1 x daily - 7 x weekly - 3 sets - 10-15 reps Prone Shoulder External Rotation with Towel Roll - 1 x daily - 7 x weekly - 3 sets - 10 reps

## 2020-03-21 ENCOUNTER — Encounter: Payer: Self-pay | Admitting: Rehabilitative and Restorative Service Providers"

## 2020-03-21 ENCOUNTER — Ambulatory Visit: Payer: 59 | Admitting: Rehabilitative and Restorative Service Providers"

## 2020-03-21 ENCOUNTER — Other Ambulatory Visit: Payer: Self-pay

## 2020-03-21 DIAGNOSIS — M542 Cervicalgia: Secondary | ICD-10-CM

## 2020-03-21 DIAGNOSIS — M25512 Pain in left shoulder: Secondary | ICD-10-CM | POA: Diagnosis not present

## 2020-03-21 DIAGNOSIS — M6281 Muscle weakness (generalized): Secondary | ICD-10-CM

## 2020-03-21 NOTE — Therapy (Signed)
[place], [place], [address] Phone: [phone]   Fax:  [phone]  Physical Therapy Treatment  Patient Details  Name: Dale [name] MRN: [id number] Date of Birth: [birth year] Referring Provider (PT): Dr Lorin Mercy   Encounter Date: 03/21/2020   PT End of Session - 03/21/20 1156    Visit Number 12    Number of Visits 20    Date for PT Re-Evaluation 04/18/20    Progress Note Due on Visit 20    PT Start Time 1143    PT Stop Time 1222    PT Time Calculation (min) 39 min    Activity Tolerance Patient tolerated treatment well    Behavior During Therapy [facility] for tasks assessed/performed           Past Medical History:  Diagnosis Date  . Allergic rhinitis, seasonal   . Anxiety   . Arthritis    back  . Depression   . Genital herpes   . GERD (gastroesophageal reflux disease)   . Gross hematuria   . Hiatal hernia   . History of chest pain    s/p  nuclear stress test, 08-26-2009, normal perfusion without ischemia , ef 58%, stated non-cardiac chest pain  . History of kidney stones   . History of nonmelanoma skin cancer    excision left leg SCC;  excision of chest BCC   . Hyperlipidemia   . Hypertension    followed by pcp   . Nocturia   . Urethral lesion    prostatic  . Wears glasses     Past Surgical History:  Procedure Laterality Date  . COLONOSCOPY  last one 07-01-2018  . CYSTOSCOPY N/A 08/23/2019   Procedure: CYSTOSCOPY WITH TRANSURETHRAL BIOPSY;  Surgeon: Ceasar Mons, MD;  Location: [facility];  Service: Urology;  Laterality: N/A;  . ESOPHAGOGASTRODUODENOSCOPY  05/28/2017  . KNEE ARTHROSCOPY Right 01/09/2013   Procedure: ARTHROSCOPY KNEE;  Surgeon: Marybelle Killings, MD;  Location: [place];  Service: Orthopedics;  Laterality: Right;  Right knee scope, partial medial menisectomy  . KNEE ARTHROSCOPY Left 2007  . KNEE SURGERY Right 2423;  1989;  1990; 2001; last one @ [facility] 06-10-2000  . [place] SURGERY  2018  . POSTERIOR LUMBAR FUSION  12-21-2011  @[facility]    L4--5  . ROTATOR CUFF REPAIR Left 2003  . TOTAL KNEE ARTHROPLASTY Right 07/10/2013   Procedure: RIGHT TOTAL KNEE ARTHROPLASTY;  Surgeon: Gearlean Alf, MD;  [facility];  Service: Orthopedics;  Laterality: Right;  Marland Kitchen VASECTOMY      There were no vitals filed for this visit.   Subjective Assessment - 03/21/20 1155    Subjective Pt. stated not having pain, was sore in upper arm and shoulder but reported it as muscle fatigue from workout hard.    Pertinent History RTC repair on Lt 2003ish per Pt.    Limitations Lifting;House hold activities;Other (comment)    Patient Stated Goals Improve Lt arm strength and use    Currently in Pain? No/denies    Pain Onset More than a month ago                             [facility] Adult PT Treatment/Exercise - 03/21/20 0001      Shoulder Exercises: Seated   Other Seated Exercises lat pull down blue band 3 x 15      Shoulder Exercises: Standing   Internal Rotation  Left   3 x 15 c towel roll   Theraband Level (Shoulder Internal Rotation) Level 3 (Green)    Flexion Strengthening;Both    Other Standing Exercises reactive ER green band holds 5 sec 2x 10 c BFR 100 mmHg      Shoulder Exercises: ROM/Strengthening   UBE (Upper Arm Bike) Lvl 3.5 UE only 5 mins fwd/back each BFR 100 mmHG Lt UE                    PT Short Term Goals - 02/27/20 1114      PT SHORT TERM GOAL #1   Title Patient will demonstrate independent use of home exercise program to maintain progress from in clinic treatments.    Time 3    Period Weeks    Status On-going    Target Date 02/20/20             PT Long Term Goals - 03/14/20 1220      PT LONG TERM GOAL #1   Title Patient will demonstrate/report pain at worst less than or equal to 2/10 to facilitate minimal limitation in daily activity secondary to pain symptoms.    Time 5    Period Weeks    Status Revised    Target  Date 04/18/20      PT LONG TERM GOAL #2   Title Patient will demonstrate independent use of home exercise program to facilitate ability to maintain/progress functional gains from skilled physical therapy services.    Time 5    Period Weeks    Status Revised    Target Date 04/18/20      PT LONG TERM GOAL #3   Title Pt. will demonstrate Lt [place] jt AROM WFL s symptoms to facilitate usual overhead reaching, self care, ressing at PLOF s limitation.    Time 5    Period Weeks    Status Revised    Target Date 04/18/20      PT LONG TERM GOAL #4   Title Pt. will demonstrate Lt UE MMT > or = 4+/5 to facilitate ability to perform usual daily and workout activity at PLOF.    Time 5    Period Weeks    Status Revised    Target Date 04/18/20      PT LONG TERM GOAL #5   Title Pt. will demonstrate Lt grip strength 85% of Lt to facilitate improved strength and function.    Time 5    Period Weeks    Status Revised    Target Date 04/18/20      PT LONG TERM GOAL #6   Title Pt. will demonstrate FOTO outcome 70% status.    Time 5    Period Weeks    Status Revised    Target Date 04/18/20                 Plan - 03/21/20 1214    Clinical Impression Statement Adjusted intervention today based off fatigue levels from last visit.  Flexion against gravity quality continued to show improvement.    Personal Factors and Comorbidities Comorbidity 3+    Comorbidities HTN, hyperlipidemia, GERD    Examination-Activity Limitations Bathing;Carry;Lift;Dressing;Reach Overhead    Examination-Participation Restrictions Cleaning;Community Activity;Driving;Yard Work;Other    Stability/Clinical Decision Making Evolving/Moderate complexity    Rehab Potential Fair    PT Frequency 2x / week    PT Treatment/Interventions ADLs/Self Care Home Management;Cryotherapy;Electrical Stimulation;Moist Heat;Balance training;Therapeutic exercise;Iontophoresis 4mg /ml Dexamethasone;Therapeutic activities;Functional mobility  training;Stair training;Gait training;Ultrasound;DME Instruction;Neuromuscular  re-education;Patient/family education;Manual techniques;Taping;Dry needling;Passive range of motion;Joint Manipulations    PT Next Visit Plan Continue strengthening in scapular and GH musculature c BFR use    PT Home Exercise Plan [email or web address]    Consulted and Agree with Plan of Care Patient           Patient will benefit from skilled therapeutic intervention in order to improve the following deficits and impairments:  Decreased endurance,Decreased activity tolerance,Decreased strength,Impaired UE functional use,Pain,Decreased mobility,Decreased range of motion,Impaired perceived functional ability,Postural dysfunction,Impaired flexibility,Decreased coordination  Visit Diagnosis: Cervicalgia  Left shoulder pain, unspecified chronicity  Muscle weakness (generalized)     Problem List Patient Active Problem List   Diagnosis Date Noted  . Idiopathic chronic gout, unspecified site, without tophus (tophi) 02/14/2020  . Synovitis of left knee 02/09/2020  . S/P cervical spinal fusion 01/03/2020  . GERD (gastroesophageal reflux disease) 03/01/2017  . Right Achilles tendinitis 12/21/2016  . HTN (hypertension) 03/03/2016  . OA (osteoarthritis) of knee 07/10/2013  . Genital herpes 05/02/2013  . CHEST PAIN 08/19/2009  . LOW BACK PAIN 06/03/2009  . ERECTILE DYSFUNCTION 06/07/2008  . HYPERLIPIDEMIA 04/03/2008  . DEPRESSION 04/03/2008  . Allergic rhinitis 04/03/2008  . INSOMNIA 04/03/2008  . NEPHROLITHIASIS, HX OF 04/03/2008    Scot Jun, PT, DPT, OCS, ATC 03/21/20  12:20 PM    [facility] [address] [place], [place], [address] Phone: [phone]   Fax:  [phone]  Name: Dale [name] MRN: [id number] Date of Birth: [birth year]

## 2020-03-26 ENCOUNTER — Ambulatory Visit (INDEPENDENT_AMBULATORY_CARE_PROVIDER_SITE_OTHER): Payer: 59 | Admitting: Orthopaedic Surgery

## 2020-03-26 ENCOUNTER — Ambulatory Visit: Payer: 59 | Admitting: Rehabilitative and Restorative Service Providers"

## 2020-03-26 ENCOUNTER — Encounter: Payer: Self-pay | Admitting: Rehabilitative and Restorative Service Providers"

## 2020-03-26 ENCOUNTER — Encounter: Payer: Self-pay | Admitting: Orthopaedic Surgery

## 2020-03-26 ENCOUNTER — Other Ambulatory Visit: Payer: Self-pay

## 2020-03-26 VITALS — BP 125/85 | HR 109 | Ht 71.0 in | Wt 185.0 lb

## 2020-03-26 DIAGNOSIS — M1A0620 Idiopathic chronic gout, left knee, without tophus (tophi): Secondary | ICD-10-CM

## 2020-03-26 DIAGNOSIS — M542 Cervicalgia: Secondary | ICD-10-CM | POA: Diagnosis not present

## 2020-03-26 DIAGNOSIS — M6281 Muscle weakness (generalized): Secondary | ICD-10-CM | POA: Diagnosis not present

## 2020-03-26 DIAGNOSIS — M25512 Pain in left shoulder: Secondary | ICD-10-CM

## 2020-03-26 NOTE — Progress Notes (Signed)
Office Visit Note   Patient: Dale [name]           Date of Birth: 1960/10/21           MRN: [id number] Visit Date: 03/26/2020              Requested by: Laurey Morale, MD [place],  [place] [address] PCP: Laurey Morale, MD   Assessment & Plan: Visit Diagnoses:  1. Idiopathic chronic gout of left knee without tophus     Plan: Patient's had problems with synovitis swelling in his left wrist in the distant past.  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  He has had some problems with other joints and I recommend he continue taking the allopurinol for some period of time and then he could possibly consider stopping it to see if he has any joint complaints and if he did then probably preferable for him to continue taking allopurinol to prevent further joint damage problems.  He can follow-up with me as needed.  Follow-Up Instructions: No follow-ups on file.   Orders:  No orders of the defined types were placed in this encounter.  No orders of the defined types were placed in this encounter.     Procedures: No procedures performed   Clinical Data: No additional findings.   Subjective: Chief Complaint  Patient presents with  . Left Knee - Follow-up    HPI 60 year old male returns for follow-up with problems with left knee gout.  He is gradually ramped up to allopurinol 300 mg daily.  He is not taking colchicine currently.  Patient had knee aspirate fluid analysis on 02/09/2020 which showed intracellular monosodium urate crystals and white blood count of 14,785.  He will need to continue the allopurinol.  He states he will talk with Dr. Sarajane Jews about setting him up for mail order so he can get several month supply which will save him some out-of-pocket cost.  He can follow-up with me if he has any specific joint complaints.  Patient's been going to do physical therapy is gone for about 2 months going twice a week.  He  is making slow gradual progress.  So probably whenever you Wednesday feel like you reach to peak  Review of Systemsupdated , no change.    Objective: Vital Signs: BP 125/85 (BP Location: Left Arm, Patient Position: Sitting)   Pulse (!) 109   Ht 5\' 11"  (1.803 m)   Wt 185 lb (83.9 kg)   BMI 25.80 kg/m   Physical Exam Constitutional:      Appearance: He is well-developed and well-nourished.  HENT:     Head: Normocephalic and atraumatic.  Eyes:     Extraocular Movements: EOM normal.     Pupils: Pupils are equal, round, and reactive to light.  Neck:     Thyroid: No thyromegaly.     Trachea: No tracheal deviation.  Cardiovascular:     Rate and Rhythm: Normal rate.  Pulmonary:     Effort: Pulmonary effort is normal.     Breath sounds: No wheezing.  Abdominal:     General: Bowel sounds are normal.     Palpations: Abdomen is soft.  Skin:    General: Skin is warm and dry.     Capillary Refill: Capillary refill takes less than 2 seconds.  Neurological:     Mental Status: He is alert and oriented to person, place, and time.  Psychiatric:        Mood and Affect: Mood and affect normal.        Behavior: Behavior normal.        Thought Content: Thought content normal.        Judgment: Judgment normal.     Ortho Exam patient has normal heel toe gait.  No effusion of the knee.  Good range of motion.  Left ankle shows trace swelling.  No tenderness over the peroneal tendons or posterior tibial tendon.  Negative anterior drawer test.  Toe extension anterior tib gastrocsoleus is normal.  Specialty Comments:  No specialty comments available.  Imaging: No results found.   PMFS History: Patient Active Problem List   Diagnosis Date Noted  . Idiopathic chronic gout, unspecified site, without tophus (tophi) 02/14/2020  . Synovitis of left knee 02/09/2020  . S/P cervical spinal fusion 01/03/2020  . GERD (gastroesophageal reflux disease) 03/01/2017  . Right Achilles tendinitis  12/21/2016  . HTN (hypertension) 03/03/2016  . OA (osteoarthritis) of knee 07/10/2013  . Genital herpes 05/02/2013  . CHEST PAIN 08/19/2009  . LOW BACK PAIN 06/03/2009  . ERECTILE DYSFUNCTION 06/07/2008  . HYPERLIPIDEMIA 04/03/2008  . DEPRESSION 04/03/2008  . Allergic rhinitis 04/03/2008  . INSOMNIA 04/03/2008  . NEPHROLITHIASIS, HX OF 04/03/2008   Past Medical History:  Diagnosis Date  . Allergic rhinitis, seasonal   . Anxiety   . Arthritis    back  . Depression   . Genital herpes   . GERD (gastroesophageal reflux disease)   . Gross hematuria   . Hiatal hernia   . History of chest pain    s/p  nuclear stress test, 08-26-2009, normal perfusion without ischemia , ef 58%, stated non-cardiac chest pain  . History of kidney stones   . History of nonmelanoma skin cancer    excision left leg SCC;  excision of chest BCC   . Hyperlipidemia   . Hypertension    followed by pcp   . Nocturia   . Urethral lesion    prostatic  . Wears glasses     Family History  Problem Relation Age of Onset  . Heart attack Father   . Coronary artery disease Father   . Heart disease Father   . Depression Other        family hx  . Hyperlipidemia Other        family hx  . Hypertension Other        family hx  . Lung cancer Other        family hx  . Stroke Other        family hx  . Hypertension Mother   . Hyperlipidemia Mother   . Colon cancer Neg Hx   . Esophageal cancer Neg Hx   . Rectal cancer Neg Hx   . Stomach cancer Neg Hx   . Colon polyps Neg Hx     Past Surgical History:  Procedure Laterality Date  . COLONOSCOPY  last one 07-01-2018  . CYSTOSCOPY N/A 08/23/2019   Procedure: CYSTOSCOPY WITH TRANSURETHRAL BIOPSY;  Surgeon: Ceasar Mons, MD;  [facility]  Service: Urology;  Laterality: N/A;  . ESOPHAGOGASTRODUODENOSCOPY  05/28/2017  . KNEE ARTHROSCOPY Right 01/09/2013   Procedure: ARTHROSCOPY KNEE;  Surgeon: Marybelle Killings, MD;  Location: [place];   Service: Orthopedics;  Laterality: Right;  Right knee scope, partial medial menisectomy  . KNEE ARTHROSCOPY Left 2007  . KNEE SURGERY Right 1983;  1989;  1990;  2001; last one @ [facility] 06-10-2000  . [place] SURGERY  2018  . POSTERIOR LUMBAR FUSION  12-21-2011  @[facility]    L4--5  . ROTATOR CUFF REPAIR Left 2003  . TOTAL KNEE ARTHROPLASTY Right 07/10/2013   Procedure: RIGHT TOTAL KNEE ARTHROPLASTY;  Surgeon: Gearlean Alf, MD;  Location: [facility];  Service: Orthopedics;  Laterality: Right;  Marland Kitchen VASECTOMY     Social History   Occupational History  . Not on file  Tobacco Use  . Smoking status: Never Smoker  . Smokeless tobacco: Never Used  Vaping Use  . Vaping Use: Never used  Substance and Sexual Activity  . Alcohol use: Yes    Comment: occasional  . Drug use: Never  . Sexual activity: Yes    Comment: VASECTOMY

## 2020-03-26 NOTE — Therapy (Signed)
[place], [place], [address] Phone: [phone]   Fax:  [phone]  Physical Therapy Treatment  Patient Details  Name: Dale [name] MRN: [id number] Date of Birth: [birth year] Referring Provider (PT): Dr Lorin Mercy   Encounter Date: 03/26/2020   PT End of Session - 03/26/20 1157    Visit Number 13    Number of Visits 20    Date for PT Re-Evaluation 04/18/20    Progress Note Due on Visit 20    PT Start Time 1140    PT Stop Time 1219    PT Time Calculation (min) 39 min    Activity Tolerance Patient tolerated treatment well    Behavior During Therapy [facility] for tasks assessed/performed           Past Medical History:  Diagnosis Date  . Allergic rhinitis, seasonal   . Anxiety   . Arthritis    back  . Depression   . Genital herpes   . GERD (gastroesophageal reflux disease)   . Gross hematuria   . Hiatal hernia   . History of chest pain    s/p  nuclear stress test, 08-26-2009, normal perfusion without ischemia , ef 58%, stated non-cardiac chest pain  . History of kidney stones   . History of nonmelanoma skin cancer    excision left leg SCC;  excision of chest BCC   . Hyperlipidemia   . Hypertension    followed by pcp   . Nocturia   . Urethral lesion    prostatic  . Wears glasses     Past Surgical History:  Procedure Laterality Date  . COLONOSCOPY  last one 07-01-2018  . CYSTOSCOPY N/A 08/23/2019   Procedure: CYSTOSCOPY WITH TRANSURETHRAL BIOPSY;  Surgeon: Ceasar Mons, MD;  Location: [facility];  Service: Urology;  Laterality: N/A;  . ESOPHAGOGASTRODUODENOSCOPY  05/28/2017  . KNEE ARTHROSCOPY Right 01/09/2013   Procedure: ARTHROSCOPY KNEE;  Surgeon: Marybelle Killings, MD;  Location: [place];  Service: Orthopedics;  Laterality: Right;  Right knee scope, partial medial menisectomy  . KNEE ARTHROSCOPY Left 2007  . KNEE SURGERY Right 7793;  1989;  1990; 2001; last one @ [facility] 06-10-2000  . [place] SURGERY  2018  . POSTERIOR LUMBAR FUSION  12-21-2011  @[facility]    L4--5  . ROTATOR CUFF REPAIR Left 2003  . TOTAL KNEE ARTHROPLASTY Right 07/10/2013   Procedure: RIGHT TOTAL KNEE ARTHROPLASTY;  Surgeon: Gearlean Alf, MD;  Location: [facility];  Service: Orthopedics;  Laterality: Right;  Marland Kitchen VASECTOMY      There were no vitals filed for this visit.   Subjective Assessment - 03/26/20 1148    Subjective Pt. stated he felt sore and achy in base of neck/upper back after yard work as well as some chest soreness.  Lt arm not huring today.    Pertinent History RTC repair on Lt 2003ish per Pt.    Limitations Lifting;House hold activities;Other (comment)    Patient Stated Goals Improve Lt arm strength and use    Currently in Pain? No/denies    Pain Score 0-No pain    Pain Location Shoulder    Pain Orientation Left    Pain Onset More than a month ago              [facility] PT Assessment - 03/26/20 0001      Assessment   Medical Diagnosis Lt shoulder pain/weakness s/p cervical fusion    Referring Provider (PT) Dr Lorin Mercy  Onset Date/Surgical Date 09/10/19    Hand Dominance Right      Strength   Overall Strength Comments Lt grip 68 lbs                         [facility] Adult PT Treatment/Exercise - 03/26/20 0001      BFR Sitting   Sitting Limb Occulsion Pressure (mmHg) 160    Sitting Exercise Prescription Comment cuff size 2      BFR Standing   Standing Limb Occulsion Pressure (mmHg) 162    Standing Exercise Prescription Comment cuff size 2      Shoulder Exercises: Standing   Flexion Both   BFR 100 mmHG 1 lb x 30 ,3 x 15 0-90 degrees, 30 sec rest   ABduction Strengthening;Both   BFR 100 mmHg x 30, 3 x 15 (movement below shrug) 30 sec breaks   Other Standing Exercises standing red band ER hold c flexion punch Lt 2 x 10    Other Standing Exercises yellow unweighted ball circles ccw, cw 20x in flexion, scaption 90 deg      Shoulder Exercises: Pulleys   Flexion 2 minutes     ABduction 2 minutes                    PT Short Term Goals - 03/26/20 1150      PT SHORT TERM GOAL #1   Title Patient will demonstrate independent use of home exercise program to maintain progress from in clinic treatments.    Time 3    Period Weeks    Status Achieved    Target Date 02/20/20             PT Long Term Goals - 03/14/20 1220      PT LONG TERM GOAL #1   Title Patient will demonstrate/report pain at worst less than or equal to 2/10 to facilitate minimal limitation in daily activity secondary to pain symptoms.    Time 5    Period Weeks    Status Revised    Target Date 04/18/20      PT LONG TERM GOAL #2   Title Patient will demonstrate independent use of home exercise program to facilitate ability to maintain/progress functional gains from skilled physical therapy services.    Time 5    Period Weeks    Status Revised    Target Date 04/18/20      PT LONG TERM GOAL #3   Title Pt. will demonstrate Lt [place] jt AROM WFL s symptoms to facilitate usual overhead reaching, self care, ressing at PLOF s limitation.    Time 5    Period Weeks    Status Revised    Target Date 04/18/20      PT LONG TERM GOAL #4   Title Pt. will demonstrate Lt UE MMT > or = 4+/5 to facilitate ability to perform usual daily and workout activity at PLOF.    Time 5    Period Weeks    Status Revised    Target Date 04/18/20      PT LONG TERM GOAL #5   Title Pt. will demonstrate Lt grip strength 85% of Lt to facilitate improved strength and function.    Time 5    Period Weeks    Status Revised    Target Date 04/18/20      PT LONG TERM GOAL #6   Title Pt. will demonstrate FOTO outcome 70% status.  Time 5    Period Weeks    Status Revised    Target Date 04/18/20                 Plan - 03/26/20 1153    Clinical Impression Statement Resumption of BFR in UE strengthening today for progression c fatigue noted but within norms for BFR activity.  Passive elevation in  flexion/abd 100 % WFL observed c pulley movement.  Pt. most likely to fall into long term HEP strengthening program for full gains with continued skilled PT services in current POC to continue gains.    Personal Factors and Comorbidities Comorbidity 3+    Comorbidities HTN, hyperlipidemia, GERD    Examination-Activity Limitations Bathing;Carry;Lift;Dressing;Reach Overhead    Examination-Participation Restrictions Cleaning;Community Activity;Driving;Yard Work;Other    Stability/Clinical Decision Making Evolving/Moderate complexity    Rehab Potential Fair    PT Frequency 2x / week    PT Treatment/Interventions ADLs/Self Care Home Management;Cryotherapy;Electrical Stimulation;Moist Heat;Balance training;Therapeutic exercise;Iontophoresis 4mg /ml Dexamethasone;Therapeutic activities;Functional mobility training;Stair training;Gait training;Ultrasound;DME Instruction;Neuromuscular re-education;Patient/family education;Manual techniques;Taping;Dry needling;Passive range of motion;Joint Manipulations    PT Next Visit Plan Continue strengthening in scapular and GH musculature c BFR use    PT Home Exercise Plan [id number]    Consulted and Agree with Plan of Care Patient           Patient will benefit from skilled therapeutic intervention in order to improve the following deficits and impairments:  Decreased endurance,Decreased activity tolerance,Decreased strength,Impaired UE functional use,Pain,Decreased mobility,Decreased range of motion,Impaired perceived functional ability,Postural dysfunction,Impaired flexibility,Decreased coordination  Visit Diagnosis: Cervicalgia  Left shoulder pain, unspecified chronicity  Muscle weakness (generalized)     Problem List Patient Active Problem List   Diagnosis Date Noted  . Idiopathic chronic gout, unspecified site, without tophus (tophi) 02/14/2020  . Synovitis of left knee 02/09/2020  . S/P cervical spinal fusion 01/03/2020  . GERD (gastroesophageal  reflux disease) 03/01/2017  . Right Achilles tendinitis 12/21/2016  . HTN (hypertension) 03/03/2016  . OA (osteoarthritis) of knee 07/10/2013  . Genital herpes 05/02/2013  . CHEST PAIN 08/19/2009  . LOW BACK PAIN 06/03/2009  . ERECTILE DYSFUNCTION 06/07/2008  . HYPERLIPIDEMIA 04/03/2008  . DEPRESSION 04/03/2008  . Allergic rhinitis 04/03/2008  . INSOMNIA 04/03/2008  . NEPHROLITHIASIS, HX OF 04/03/2008    Scot Jun, PT, DPT, OCS, ATC 03/26/20  12:13 PM    [facility] Physical Therapy [address] [place], [place], [address] Phone: [phone]   Fax:  [phone]  Name: SOHAN POTVIN MRN: [id number] Date of Birth: [birth year]

## 2020-03-28 ENCOUNTER — Other Ambulatory Visit: Payer: Self-pay

## 2020-03-28 ENCOUNTER — Encounter: Payer: Self-pay | Admitting: Rehabilitative and Restorative Service Providers"

## 2020-03-28 ENCOUNTER — Ambulatory Visit: Payer: 59 | Admitting: Rehabilitative and Restorative Service Providers"

## 2020-03-28 DIAGNOSIS — M6281 Muscle weakness (generalized): Secondary | ICD-10-CM

## 2020-03-28 DIAGNOSIS — M542 Cervicalgia: Secondary | ICD-10-CM

## 2020-03-28 DIAGNOSIS — M25512 Pain in left shoulder: Secondary | ICD-10-CM

## 2020-03-28 NOTE — Therapy (Signed)
[place], [place], [address] Phone: [phone]   Fax:  [phone]  Physical Therapy Treatment  Patient Details  Name: Dale [name] MRN: [id number] Date of Birth: [birth year] Referring Provider (PT): Dr Lorin Mercy   Encounter Date: 03/28/2020   PT End of Session - 03/28/20 1148    Visit Number 14    Number of Visits 20    Date for PT Re-Evaluation 04/18/20    Progress Note Due on Visit 20    PT Start Time 1144    PT Stop Time 1223    PT Time Calculation (min) 39 min    Activity Tolerance Patient tolerated treatment well    Behavior During Therapy [facility] for tasks assessed/performed           Past Medical History:  Diagnosis Date  . Allergic rhinitis, seasonal   . Anxiety   . Arthritis    back  . Depression   . Genital herpes   . GERD (gastroesophageal reflux disease)   . Gross hematuria   . Hiatal hernia   . History of chest pain    s/p  nuclear stress test, 08-26-2009, normal perfusion without ischemia , ef 58%, stated non-cardiac chest pain  . History of kidney stones   . History of nonmelanoma skin cancer    excision left leg SCC;  excision of chest BCC   . Hyperlipidemia   . Hypertension    followed by pcp   . Nocturia   . Urethral lesion    prostatic  . Wears glasses     Past Surgical History:  Procedure Laterality Date  . COLONOSCOPY  last one 07-01-2018  . CYSTOSCOPY N/A 08/23/2019   Procedure: CYSTOSCOPY WITH TRANSURETHRAL BIOPSY;  Surgeon: Ceasar Mons, MD;  Location: [facility];  Service: Urology;  Laterality: N/A;  . ESOPHAGOGASTRODUODENOSCOPY  05/28/2017  . KNEE ARTHROSCOPY Right 01/09/2013   Procedure: ARTHROSCOPY KNEE;  Surgeon: Marybelle Killings, MD;  Location: [place];  Service: Orthopedics;  Laterality: Right;  Right knee scope, partial medial menisectomy  . KNEE ARTHROSCOPY Left 2007  . KNEE SURGERY Right 3154;  1989;  1990; 2001; last one @ [facility] 06-10-2000  .  [place] SURGERY  2018  . POSTERIOR LUMBAR FUSION  12-21-2011  @[facility]    L4--5  . ROTATOR CUFF REPAIR Left 2003  . TOTAL KNEE ARTHROPLASTY Right 07/10/2013   Procedure: RIGHT TOTAL KNEE ARTHROPLASTY;  Surgeon: Gearlean Alf, MD;  Location: [facility];  Service: Orthopedics;  Laterality: Right;  Marland Kitchen VASECTOMY      There were no vitals filed for this visit.   Subjective Assessment - 03/28/20 1147    Subjective Pt. indicated feeling sore in shoulder like he worked out hard last time.  Generalized soreness was indicated.  No specific pain to report.    Pertinent History RTC repair on Lt 2003ish per Pt.    Limitations Lifting;House hold activities;Other (comment)    Patient Stated Goals Improve Lt arm strength and use    Currently in Pain? No/denies    Pain Score 0-No pain    Pain Location Shoulder    Pain Orientation Left    Pain Descriptors / Indicators Sore    Pain Onset More than a month ago    Aggravating Factors  soreness after exercise                             [facility]  Adult PT Treatment/Exercise - 03/28/20 0001      BFR Sitting   Sitting Limb Occulsion Pressure (mmHg) 160    Sitting Exercise Prescription Comment cuff size 2      BFR Standing   Standing Limb Occulsion Pressure (mmHg) 160    Standing Exercise Prescription Comment cuff size 2      Shoulder Exercises: Supine   Horizontal ABduction Both   blue 3 x 10   Diagonals Both   x 15 D1 and D2 extension focus blue band     Shoulder Exercises: Seated   Other Seated Exercises lat pull down blue band x 30, 3 x 15 BFR 100 mmHg      Shoulder Exercises: Standing   Flexion Both   3 x 15 2 lbs   Other Standing Exercises standing red band ER hold c flexion punch Lt 3 x 10 cBFR 100 mmHG    Other Standing Exercises ER reactive walk outs 10 sec x 10 blue band BFR 100 mmHg, wall push ups 3 x 10 c SA press hold 2 seconds      Shoulder Exercises: ROM/Strengthening   UBE (Upper Arm Bike) Lvl 3.5 fwd/back 3 mins each  way, no BFR today                    PT Short Term Goals - 03/26/20 1150      PT SHORT TERM GOAL #1   Title Patient will demonstrate independent use of home exercise program to maintain progress from in clinic treatments.    Time 3    Period Weeks    Status Achieved    Target Date 02/20/20             PT Long Term Goals - 03/14/20 1220      PT LONG TERM GOAL #1   Title Patient will demonstrate/report pain at worst less than or equal to 2/10 to facilitate minimal limitation in daily activity secondary to pain symptoms.    Time 5    Period Weeks    Status Revised    Target Date 04/18/20      PT LONG TERM GOAL #2   Title Patient will demonstrate independent use of home exercise program to facilitate ability to maintain/progress functional gains from skilled physical therapy services.    Time 5    Period Weeks    Status Revised    Target Date 04/18/20      PT LONG TERM GOAL #3   Title Pt. will demonstrate Lt [place] jt AROM WFL s symptoms to facilitate usual overhead reaching, self care, ressing at PLOF s limitation.    Time 5    Period Weeks    Status Revised    Target Date 04/18/20      PT LONG TERM GOAL #4   Title Pt. will demonstrate Lt UE MMT > or = 4+/5 to facilitate ability to perform usual daily and workout activity at PLOF.    Time 5    Period Weeks    Status Revised    Target Date 04/18/20      PT LONG TERM GOAL #5   Title Pt. will demonstrate Lt grip strength 85% of Lt to facilitate improved strength and function.    Time 5    Period Weeks    Status Revised    Target Date 04/18/20      PT LONG TERM GOAL #6   Title Pt. will demonstrate FOTO outcome 70% status.  Time 5    Period Weeks    Status Revised    Target Date 04/18/20                 Plan - 03/28/20 1211    Clinical Impression Statement Muscle fatigue post activity is positive in indicating progression of strength training reaching appropraite levels.  Still plan to monitor  to no create excessive complaints that don't resolve over 1-2 days.  Making gain soverall but can still benefit from continued skilled PT intervention for strength deficits that are present.    Personal Factors and Comorbidities Comorbidity 3+    Comorbidities HTN, hyperlipidemia, GERD    Examination-Activity Limitations Bathing;Carry;Lift;Dressing;Reach Overhead    Examination-Participation Restrictions Cleaning;Community Activity;Driving;Yard Work;Other    Stability/Clinical Decision Making Evolving/Moderate complexity    Rehab Potential Fair    PT Frequency 2x / week    PT Treatment/Interventions ADLs/Self Care Home Management;Cryotherapy;Electrical Stimulation;Moist Heat;Balance training;Therapeutic exercise;Iontophoresis 4mg /ml Dexamethasone;Therapeutic activities;Functional mobility training;Stair training;Gait training;Ultrasound;DME Instruction;Neuromuscular re-education;Patient/family education;Manual techniques;Taping;Dry needling;Passive range of motion;Joint Manipulations    PT Next Visit Plan Continue strengthening in scapular and GH musculature c BFR use    PT Home Exercise Plan [id number]    Consulted and Agree with Plan of Care Patient           Patient will benefit from skilled therapeutic intervention in order to improve the following deficits and impairments:  Decreased endurance,Decreased activity tolerance,Decreased strength,Impaired UE functional use,Pain,Decreased mobility,Decreased range of motion,Impaired perceived functional ability,Postural dysfunction,Impaired flexibility,Decreased coordination  Visit Diagnosis: Cervicalgia  Left shoulder pain, unspecified chronicity  Muscle weakness (generalized)     Problem List Patient Active Problem List   Diagnosis Date Noted  . Idiopathic chronic gout, unspecified site, without tophus (tophi) 02/14/2020  . Synovitis of left knee 02/09/2020  . S/P cervical spinal fusion 01/03/2020  . GERD (gastroesophageal reflux  disease) 03/01/2017  . Right Achilles tendinitis 12/21/2016  . HTN (hypertension) 03/03/2016  . OA (osteoarthritis) of knee 07/10/2013  . Genital herpes 05/02/2013  . CHEST PAIN 08/19/2009  . LOW BACK PAIN 06/03/2009  . ERECTILE DYSFUNCTION 06/07/2008  . HYPERLIPIDEMIA 04/03/2008  . DEPRESSION 04/03/2008  . Allergic rhinitis 04/03/2008  . INSOMNIA 04/03/2008  . NEPHROLITHIASIS, HX OF 04/03/2008    Scot Jun, PT, DPT, OCS, ATC 03/28/20  12:17 PM    [facility] Physical Therapy [address] [place], [place], [address] Phone: [phone]   Fax:  [phone]  Name: Dale [name] MRN: [id number] Date of Birth: 07/19/1960

## 2020-04-02 ENCOUNTER — Encounter: Payer: Self-pay | Admitting: Rehabilitative and Restorative Service Providers"

## 2020-04-02 ENCOUNTER — Other Ambulatory Visit: Payer: Self-pay

## 2020-04-02 ENCOUNTER — Ambulatory Visit: Payer: 59 | Admitting: Rehabilitative and Restorative Service Providers"

## 2020-04-02 DIAGNOSIS — M542 Cervicalgia: Secondary | ICD-10-CM

## 2020-04-02 DIAGNOSIS — M25512 Pain in left shoulder: Secondary | ICD-10-CM | POA: Diagnosis not present

## 2020-04-02 DIAGNOSIS — M6281 Muscle weakness (generalized): Secondary | ICD-10-CM

## 2020-04-02 NOTE — Therapy (Signed)
[facility] [place], [place], [address] Phone: [phone]   Fax:  [phone]  Physical Therapy Treatment  Patient Details  Name: Dale [name] MRN: [id number] Date of Birth: 1960-05-07 Referring Provider (PT): Dr Lorin Mercy   Encounter Date: 04/02/2020   PT End of Session - 04/02/20 1210    Visit Number 15    Number of Visits 20    Date for PT Re-Evaluation 04/18/20    Progress Note Due on Visit 20    PT Start Time 1145    PT Stop Time 1223    PT Time Calculation (min) 38 min    Activity Tolerance Patient tolerated treatment well    Behavior During Therapy [facility] for tasks assessed/performed           Past Medical History:  Diagnosis Date  . Allergic rhinitis, seasonal   . Anxiety   . Arthritis    back  . Depression   . Genital herpes   . GERD (gastroesophageal reflux disease)   . Gross hematuria   . Hiatal hernia   . History of chest pain    s/p  nuclear stress test, 08-26-2009, normal perfusion without ischemia , ef 58%, stated non-cardiac chest pain  . History of kidney stones   . History of nonmelanoma skin cancer    excision left leg SCC;  excision of chest BCC   . Hyperlipidemia   . Hypertension    followed by pcp   . Nocturia   . Urethral lesion    prostatic  . Wears glasses     Past Surgical History:  Procedure Laterality Date  . COLONOSCOPY  last one 07-01-2018  . CYSTOSCOPY N/A 08/23/2019   Procedure: CYSTOSCOPY WITH TRANSURETHRAL BIOPSY;  Surgeon: Ceasar Mons, MD;  Location: [facility];  Service: Urology;  Laterality: N/A;  . ESOPHAGOGASTRODUODENOSCOPY  05/28/2017  . KNEE ARTHROSCOPY Right 01/09/2013   Procedure: ARTHROSCOPY KNEE;  Surgeon: Marybelle Killings, MD;  Location: [place];  Service: Orthopedics;  Laterality: Right;  Right knee scope, partial medial menisectomy  . KNEE ARTHROSCOPY Left 2007  . KNEE SURGERY Right 5038;  1989;  1990; 2001; last one @ [facility] 06-10-2000  .  [place] SURGERY  2018  . POSTERIOR LUMBAR FUSION  12-21-2011  @[facility]    L4--5  . ROTATOR CUFF REPAIR Left 2003  . TOTAL KNEE ARTHROPLASTY Right 07/10/2013   Procedure: RIGHT TOTAL KNEE ARTHROPLASTY;  Surgeon: Gearlean Alf, MD;  Location: [facility];  Service: Orthopedics;  Laterality: Right;  Marland Kitchen VASECTOMY      There were no vitals filed for this visit.   Subjective Assessment - 04/02/20 1209    Subjective Pt. reported "same old " presentation today as last several visits.    Pertinent History RTC repair on Lt 2003ish per Pt.    Limitations Lifting;House hold activities;Other (comment)    Patient Stated Goals Improve Lt arm strength and use    Currently in Pain? No/denies    Pain Onset More than a month ago              [facility] PT Assessment - 04/02/20 0001      Strength   Overall Strength Comments dynamometer testing seated:  flexion (Lt 5.1 lbs, Rt 18.1 lbs)  abd (Lt 5 lbs, Rt 18.1 lbs) ER (Lt 11.4 lbs, Rt 16.5 lbs)   measured in MMT positioning  [facility] Adult PT Treatment/Exercise - 04/02/20 0001      BFR Standing   Standing Limb Occulsion Pressure (mmHg) 160    Standing Exercise Prescription Comment cuff size 2      Shoulder Exercises: Standing   Flexion Both   BFR 100 mmHG 30, 3x 15 30 sec breaks 0-90 deg   Shoulder Flexion Weight (lbs) 2    ABduction Both   BFR 100 mmHG 1 lb 30, 3 x 15 c 30 sec breaks 0-90 deg   Shoulder ABduction Weight (lbs) 1    Other Standing Exercises reactive ER step out 2 x 10 5 sec hold blue band    Other Standing Exercises 90 deg flexion, scaption 2 lb ball circles on wall 20 x cw, ccw each Lt UE      Shoulder Exercises: Pulleys   Flexion 2 minutes    ABduction 2 minutes      Shoulder Exercises: ROM/Strengthening   UBE (Upper Arm Bike) Lvl 3.5 fwd/back 3 mins each way, no BFR today                    PT Short Term Goals - 03/26/20 1150      PT SHORT TERM GOAL #1   Title Patient will  demonstrate independent use of home exercise program to maintain progress from in clinic treatments.    Time 3    Period Weeks    Status Achieved    Target Date 02/20/20             PT Long Term Goals - 03/14/20 1220      PT LONG TERM GOAL #1   Title Patient will demonstrate/report pain at worst less than or equal to 2/10 to facilitate minimal limitation in daily activity secondary to pain symptoms.    Time 5    Period Weeks    Status Revised    Target Date 04/18/20      PT LONG TERM GOAL #2   Title Patient will demonstrate independent use of home exercise program to facilitate ability to maintain/progress functional gains from skilled physical therapy services.    Time 5    Period Weeks    Status Revised    Target Date 04/18/20      PT LONG TERM GOAL #3   Title Pt. will demonstrate Lt [place] jt AROM WFL s symptoms to facilitate usual overhead reaching, self care, ressing at PLOF s limitation.    Time 5    Period Weeks    Status Revised    Target Date 04/18/20      PT LONG TERM GOAL #4   Title Pt. will demonstrate Lt UE MMT > or = 4+/5 to facilitate ability to perform usual daily and workout activity at PLOF.    Time 5    Period Weeks    Status Revised    Target Date 04/18/20      PT LONG TERM GOAL #5   Title Pt. will demonstrate Lt grip strength 85% of Lt to facilitate improved strength and function.    Time 5    Period Weeks    Status Revised    Target Date 04/18/20      PT LONG TERM GOAL #6   Title Pt. will demonstrate FOTO outcome 70% status.    Time 5    Period Weeks    Status Revised    Target Date 04/18/20  Plan - 04/02/20 1209    Clinical Impression Statement Hand held dynamometry for UE strength flexion, abd, ER documented today showing impairments from Lt UE at this time.  Pt. has continued to show slow and steady overall improvement in movement pattern quality and function in daily life but still has limitations due to strength  deficits.    Personal Factors and Comorbidities Comorbidity 3+    Comorbidities HTN, hyperlipidemia, GERD    Examination-Activity Limitations Bathing;Carry;Lift;Dressing;Reach Overhead    Examination-Participation Restrictions Cleaning;Community Activity;Driving;Yard Work;Other    Stability/Clinical Decision Making Evolving/Moderate complexity    Rehab Potential Fair    PT Frequency 2x / week    PT Treatment/Interventions ADLs/Self Care Home Management;Cryotherapy;Electrical Stimulation;Moist Heat;Balance training;Therapeutic exercise;Iontophoresis 4mg /ml Dexamethasone;Therapeutic activities;Functional mobility training;Stair training;Gait training;Ultrasound;DME Instruction;Neuromuscular re-education;Patient/family education;Manual techniques;Taping;Dry needling;Passive range of motion;Joint Manipulations    PT Next Visit Plan Continue strengthening in scapular and GH musculature c BFR use    PT Home Exercise Plan [id number]    Consulted and Agree with Plan of Care Patient           Patient will benefit from skilled therapeutic intervention in order to improve the following deficits and impairments:  Decreased endurance,Decreased activity tolerance,Decreased strength,Impaired UE functional use,Pain,Decreased mobility,Decreased range of motion,Impaired perceived functional ability,Postural dysfunction,Impaired flexibility,Decreased coordination  Visit Diagnosis: Cervicalgia  Left shoulder pain, unspecified chronicity  Muscle weakness (generalized)     Problem List Patient Active Problem List   Diagnosis Date Noted  . Idiopathic chronic gout, unspecified site, without tophus (tophi) 02/14/2020  . Synovitis of left knee 02/09/2020  . S/P cervical spinal fusion 01/03/2020  . GERD (gastroesophageal reflux disease) 03/01/2017  . Right Achilles tendinitis 12/21/2016  . HTN (hypertension) 03/03/2016  . OA (osteoarthritis) of knee 07/10/2013  . Genital herpes 05/02/2013  . CHEST PAIN  08/19/2009  . LOW BACK PAIN 06/03/2009  . ERECTILE DYSFUNCTION 06/07/2008  . HYPERLIPIDEMIA 04/03/2008  . DEPRESSION 04/03/2008  . Allergic rhinitis 04/03/2008  . INSOMNIA 04/03/2008  . NEPHROLITHIASIS, HX OF 04/03/2008   Scot Jun, PT, DPT, OCS, ATC 04/02/20  12:16 PM    [facility] [address] [place], [place], [address] Phone: [phone]   Fax:  [phone]  Name: DAMARIE SCHOOLFIELD MRN: [id number] Date of Birth: 1960-05-06

## 2020-04-04 ENCOUNTER — Ambulatory Visit: Payer: 59 | Admitting: Rehabilitative and Restorative Service Providers"

## 2020-04-04 ENCOUNTER — Other Ambulatory Visit: Payer: Self-pay

## 2020-04-04 ENCOUNTER — Encounter: Payer: Self-pay | Admitting: Rehabilitative and Restorative Service Providers"

## 2020-04-04 DIAGNOSIS — M6281 Muscle weakness (generalized): Secondary | ICD-10-CM

## 2020-04-04 DIAGNOSIS — M542 Cervicalgia: Secondary | ICD-10-CM | POA: Diagnosis not present

## 2020-04-04 DIAGNOSIS — M25512 Pain in left shoulder: Secondary | ICD-10-CM | POA: Diagnosis not present

## 2020-04-04 NOTE — Therapy (Signed)
[name], [place], [address] Phone: [phone]   Fax:  [phone]  Physical Therapy Treatment  Patient Details  Name: Dale [name] MRN: [id number] Date of Birth: 23-Mar-1960 Referring Provider (PT): Dr Lorin Mercy   Encounter Date: 04/04/2020   PT End of Session - 04/04/20 1216    Visit Number 16    Number of Visits 20    Date for PT Re-Evaluation 04/18/20    Progress Note Due on Visit 20    PT Start Time 1144    PT Stop Time 1223    PT Time Calculation (min) 39 min    Activity Tolerance Patient tolerated treatment well    Behavior During Therapy [facility] for tasks assessed/performed           Past Medical History:  Diagnosis Date  . Allergic rhinitis, seasonal   . Anxiety   . Arthritis    back  . Depression   . Genital herpes   . GERD (gastroesophageal reflux disease)   . Gross hematuria   . Hiatal hernia   . History of chest pain    s/p  nuclear stress test, 08-26-2009, normal perfusion without ischemia , ef 58%, stated non-cardiac chest pain  . History of kidney stones   . History of nonmelanoma skin cancer    excision left leg SCC;  excision of chest BCC   . Hyperlipidemia   . Hypertension    followed by pcp   . Nocturia   . Urethral lesion    prostatic  . Wears glasses     Past Surgical History:  Procedure Laterality Date  . COLONOSCOPY  last one 07-01-2018  . CYSTOSCOPY N/A 08/23/2019   Procedure: CYSTOSCOPY WITH TRANSURETHRAL BIOPSY;  Surgeon: Ceasar Mons, MD;  Location: [facility];  Service: Urology;  Laterality: N/A;  . ESOPHAGOGASTRODUODENOSCOPY  05/28/2017  . KNEE ARTHROSCOPY Right 01/09/2013   Procedure: ARTHROSCOPY KNEE;  Surgeon: Marybelle Killings, MD;  Location: [place];  Service: Orthopedics;  Laterality: Right;  Right knee scope, partial medial menisectomy  . KNEE ARTHROSCOPY Left 2007  . KNEE SURGERY Right 7824;  1989;  1990; 2001; last one @ [facility] 06-10-2000  .  [place] SURGERY  2018  . POSTERIOR LUMBAR FUSION  12-21-2011  @[facility]    L4--5  . ROTATOR CUFF REPAIR Left 2003  . TOTAL KNEE ARTHROPLASTY Right 07/10/2013   Procedure: RIGHT TOTAL KNEE ARTHROPLASTY;  Surgeon: Gearlean Alf, MD;  Location: [facility];  Service: Orthopedics;  Laterality: Right;  Marland Kitchen VASECTOMY      There were no vitals filed for this visit.   Subjective Assessment - 04/04/20 1213    Subjective Pt. indicated some reduction of nighttime trouble.  Sore after working hard at times.    Pertinent History RTC repair on Lt 2003ish per Pt.    Limitations Lifting;House hold activities;Other (comment)    Patient Stated Goals Improve Lt arm strength and use    Currently in Pain? No/denies    Pain Onset More than a month ago                             [facility] Adult PT Treatment/Exercise - 04/04/20 0001      Shoulder Exercises: Standing   External Rotation Left   3 x 15 green band BFR 100 mmHG   Flexion Both   BFR 100 mmHg 2 lb 30, 3 x 15 90 deg  Shoulder Flexion Weight (lbs) 2    ABduction Both   BFR 100 mmHg 1 lb 30, 3 x 15 90 deg   Shoulder ABduction Weight (lbs) 1      Shoulder Exercises: ROM/Strengthening   UBE (Upper Arm Bike) lvl 3.5 3 mins fwd/back each way    Lat Pull Other (comment)   3 x 15 35 lbs   Cybex Press Other (comment)   3 x 15                   PT Short Term Goals - 03/26/20 1150      PT SHORT TERM GOAL #1   Title Patient will demonstrate independent use of home exercise program to maintain progress from in clinic treatments.    Time 3    Period Weeks    Status Achieved    Target Date 02/20/20             PT Long Term Goals - 04/04/20 1213      PT LONG TERM GOAL #1   Title Patient will demonstrate/report pain at worst less than or equal to 2/10 to facilitate minimal limitation in daily activity secondary to pain symptoms.    Time 5    Period Weeks    Status On-going    Target Date 04/18/20      PT LONG TERM GOAL #2    Title Patient will demonstrate independent use of home exercise program to facilitate ability to maintain/progress functional gains from skilled physical therapy services.    Time 5    Period Weeks    Status Achieved      PT LONG TERM GOAL #3   Title Pt. will demonstrate Lt [place] jt AROM WFL s symptoms to facilitate usual overhead reaching, self care, ressing at PLOF s limitation.    Time 5    Period Weeks    Status On-going    Target Date 04/18/20      PT LONG TERM GOAL #4   Title Pt. will demonstrate Lt UE MMT > or = 4+/5 to facilitate ability to perform usual daily and workout activity at PLOF.    Time 5    Period Weeks    Status On-going    Target Date 04/18/20      PT LONG TERM GOAL #5   Title Pt. will demonstrate Lt grip strength 85% of Lt to facilitate improved strength and function.    Time 5    Period Weeks    Status On-going    Target Date 04/18/20      PT LONG TERM GOAL #6   Title Pt. will demonstrate FOTO outcome 70% status.    Time 5    Period Weeks    Status On-going    Target Date 04/18/20                 Plan - 04/04/20 1214    Clinical Impression Statement Continued good tolerance to intervention in clinic, fatigue noted in endurance based exercise still.  Making slow but steady gains overall at this time.  Continued strengthening of GH and scapular musculature to benefit Pt. in daily use.    Personal Factors and Comorbidities Comorbidity 3+    Comorbidities HTN, hyperlipidemia, GERD    Examination-Activity Limitations Bathing;Carry;Lift;Dressing;Reach Overhead    Examination-Participation Restrictions Cleaning;Community Activity;Driving;Yard Work;Other    Stability/Clinical Decision Making Evolving/Moderate complexity    Rehab Potential Fair    PT Frequency 2x / week    PT  Treatment/Interventions ADLs/Self Care Home Management;Cryotherapy;Electrical Stimulation;Moist Heat;Balance training;Therapeutic exercise;Iontophoresis 4mg /ml  Dexamethasone;Therapeutic activities;Functional mobility training;Stair training;Gait training;Ultrasound;DME Instruction;Neuromuscular re-education;Patient/family education;Manual techniques;Taping;Dry needling;Passive range of motion;Joint Manipulations    PT Next Visit Plan Continue strengthening in scapular and GH musculature c BFR use    PT Home Exercise Plan [id number]    Consulted and Agree with Plan of Care Patient           Patient will benefit from skilled therapeutic intervention in order to improve the following deficits and impairments:  Decreased endurance,Decreased activity tolerance,Decreased strength,Impaired UE functional use,Pain,Decreased mobility,Decreased range of motion,Impaired perceived functional ability,Postural dysfunction,Impaired flexibility,Decreased coordination  Visit Diagnosis: Cervicalgia  Left shoulder pain, unspecified chronicity  Muscle weakness (generalized)     Problem List Patient Active Problem List   Diagnosis Date Noted  . Idiopathic chronic gout, unspecified site, without tophus (tophi) 02/14/2020  . Synovitis of left knee 02/09/2020  . S/P cervical spinal fusion 01/03/2020  . GERD (gastroesophageal reflux disease) 03/01/2017  . Right Achilles tendinitis 12/21/2016  . HTN (hypertension) 03/03/2016  . OA (osteoarthritis) of knee 07/10/2013  . Genital herpes 05/02/2013  . CHEST PAIN 08/19/2009  . LOW BACK PAIN 06/03/2009  . ERECTILE DYSFUNCTION 06/07/2008  . HYPERLIPIDEMIA 04/03/2008  . DEPRESSION 04/03/2008  . Allergic rhinitis 04/03/2008  . INSOMNIA 04/03/2008  . NEPHROLITHIASIS, HX OF 04/03/2008   Scot Jun, PT, DPT, OCS, ATC 04/04/20  12:18 PM    [facility] [address] [place], [place], [address] Phone: [phone]   Fax:  [phone]  Name: HASKELL RIHN MRN: [id number] Date of Birth: 04/03/1960

## 2020-04-09 ENCOUNTER — Encounter: Payer: 59 | Admitting: Rehabilitative and Restorative Service Providers"

## 2020-04-11 ENCOUNTER — Other Ambulatory Visit: Payer: Self-pay

## 2020-04-11 ENCOUNTER — Encounter: Payer: Self-pay | Admitting: Rehabilitative and Restorative Service Providers"

## 2020-04-11 ENCOUNTER — Ambulatory Visit: Payer: 59 | Admitting: Rehabilitative and Restorative Service Providers"

## 2020-04-11 DIAGNOSIS — M25512 Pain in left shoulder: Secondary | ICD-10-CM

## 2020-04-11 DIAGNOSIS — M6281 Muscle weakness (generalized): Secondary | ICD-10-CM | POA: Diagnosis not present

## 2020-04-11 DIAGNOSIS — M542 Cervicalgia: Secondary | ICD-10-CM

## 2020-04-11 NOTE — Therapy (Signed)
[facility] [address] [place], [place], [address] Phone: [phone]   Fax:  [phone]  Physical Therapy Treatment  Patient Details  Name: Dale [name] MRN: [id number] Date of Birth: 1960-09-30 Referring Provider (PT): Dr Lorin Mercy   Encounter Date: 04/11/2020   PT End of Session - 04/11/20 1149    Visit Number 17    Number of Visits 20    Date for PT Re-Evaluation 04/18/20    Progress Note Due on Visit 48    PT Start Time 1143    PT Stop Time 1222    PT Time Calculation (min) 39 min    Activity Tolerance Patient tolerated treatment well    Behavior During Therapy [facility] for tasks assessed/performed           Past Medical History:  Diagnosis Date   Allergic rhinitis, seasonal    Anxiety    Arthritis    back   Depression    Genital herpes    GERD (gastroesophageal reflux disease)    Gross hematuria    Hiatal hernia    History of chest pain    s/p  nuclear stress test, 08-26-2009, normal perfusion without ischemia , ef 58%, stated non-cardiac chest pain   History of kidney stones    History of nonmelanoma skin cancer    excision left leg SCC;  excision of chest BCC    Hyperlipidemia    Hypertension    followed by pcp    Nocturia    Urethral lesion    prostatic   Wears glasses     Past Surgical History:  Procedure Laterality Date   COLONOSCOPY  last one 07-01-2018   CYSTOSCOPY N/A 08/23/2019   Procedure: CYSTOSCOPY WITH TRANSURETHRAL BIOPSY;  Surgeon: Ceasar Mons, MD;  Location: [facility];  Service: Urology;  Laterality: N/A;   ESOPHAGOGASTRODUODENOSCOPY  05/28/2017   KNEE ARTHROSCOPY Right 01/09/2013   Procedure: ARTHROSCOPY KNEE;  Surgeon: Marybelle Killings, MD;  Location: [place];  Service: Orthopedics;  Laterality: Right;  Right knee scope, partial medial menisectomy   KNEE ARTHROSCOPY Left 2007   KNEE SURGERY Right 1983;  1989;  1990; 2001; last one @ [facility] 06-10-2000    LUMBAR DISC SURGERY  2018   POSTERIOR LUMBAR FUSION  12-21-2011  @[facility]    L4--5   ROTATOR CUFF REPAIR Left 2003   TOTAL KNEE ARTHROPLASTY Right 07/10/2013   Procedure: RIGHT TOTAL KNEE ARTHROPLASTY;  Surgeon: Gearlean Alf, MD;  Location: [facility];  Service: Orthopedics;  Laterality: Right;   VASECTOMY      There were no vitals filed for this visit.   Subjective Assessment - 04/11/20 1148    Subjective Pt. indicated some soreness in neck and shoulder after faucet work but nothing today.    Pertinent History RTC repair on Lt 2003ish per Pt.    Limitations Lifting;House hold activities;Other (comment)    Patient Stated Goals Improve Lt arm strength and use    Currently in Pain? No/denies    Pain Score 0-No pain    Pain Onset More than a month ago                             [facility] Adult PT Treatment/Exercise - 04/11/20 0001      BFR Standing   Standing Limb Occulsion Pressure (mmHg) 160    Standing Exercise Prescription Comment cuff size 2      Shoulder Exercises: Prone  Other Prone Exercises y, t 1 lb 3 x 10 each      Shoulder Exercises: Standing   External Rotation Left   3 x 10 c hold to fatigue at end of each set, BFR 100 mmHg   Flexion Both   BFR 100 mmHg 30 x, 3 x 15 c 30 sec breaks   Shoulder Flexion Weight (lbs) 3    ABduction Both   BFR 100 mmHg 30x 3 x 15 c 30 sec breaks   Shoulder ABduction Weight (lbs) 2      Shoulder Exercises: ROM/Strengthening   UBE (Upper Arm Bike) Lvl 4 3 mins fwd/back warm up/ROM                    PT Short Term Goals - 03/26/20 1150      PT SHORT TERM GOAL #1   Title Patient will demonstrate independent use of home exercise program to maintain progress from in clinic treatments.    Time 3    Period Weeks    Status Achieved    Target Date 02/20/20             PT Long Term Goals - 04/04/20 1213      PT LONG TERM GOAL #1   Title Patient will demonstrate/report pain at worst less than or equal to  2/10 to facilitate minimal limitation in daily activity secondary to pain symptoms.    Time 5    Period Weeks    Status On-going    Target Date 04/18/20      PT LONG TERM GOAL #2   Title Patient will demonstrate independent use of home exercise program to facilitate ability to maintain/progress functional gains from skilled physical therapy services.    Time 5    Period Weeks    Status Achieved      PT LONG TERM GOAL #3   Title Pt. will demonstrate Lt [place] jt AROM WFL s symptoms to facilitate usual overhead reaching, self care, ressing at PLOF s limitation.    Time 5    Period Weeks    Status On-going    Target Date 04/18/20      PT LONG TERM GOAL #4   Title Pt. will demonstrate Lt UE MMT > or = 4+/5 to facilitate ability to perform usual daily and workout activity at PLOF.    Time 5    Period Weeks    Status On-going    Target Date 04/18/20      PT LONG TERM GOAL #5   Title Pt. will demonstrate Lt grip strength 85% of Lt to facilitate improved strength and function.    Time 5    Period Weeks    Status On-going    Target Date 04/18/20      PT LONG TERM GOAL #6   Title Pt. will demonstrate FOTO outcome 70% status.    Time 5    Period Weeks    Status On-going    Target Date 04/18/20                 Plan - 04/11/20 1203    Clinical Impression Statement Prone scaption, horizontal abd still showing restriction in overall movement but improved mid range control noted.  Continued strengthening plan indicated.    Personal Factors and Comorbidities Comorbidity 3+    Comorbidities HTN, hyperlipidemia, GERD    Examination-Activity Limitations Bathing;Carry;Lift;Dressing;Reach Overhead    Examination-Participation Restrictions Cleaning;Community Activity;Driving;Yard Work;Other    Stability/Clinical Environmental health practitioner  Evolving/Moderate complexity    Rehab Potential Fair    PT Frequency 2x / week    PT Treatment/Interventions ADLs/Self Care Home  Management;Cryotherapy;Electrical Stimulation;Moist Heat;Balance training;Therapeutic exercise;Iontophoresis 4mg /ml Dexamethasone;Therapeutic activities;Functional mobility training;Stair training;Gait training;Ultrasound;DME Instruction;Neuromuscular re-education;Patient/family education;Manual techniques;Taping;Dry needling;Passive range of motion;Joint Manipulations    PT Next Visit Plan Continue strengthening in scapular and [place] musculature c BFR use    PT Home Exercise Plan [id number]    Consulted and Agree with Plan of Care Patient           Patient will benefit from skilled therapeutic intervention in order to improve the following deficits and impairments:  Decreased endurance,Decreased activity tolerance,Decreased strength,Impaired UE functional use,Pain,Decreased mobility,Decreased range of motion,Impaired perceived functional ability,Postural dysfunction,Impaired flexibility,Decreased coordination  Visit Diagnosis: Cervicalgia  Left shoulder pain, unspecified chronicity  Muscle weakness (generalized)     Problem List Patient Active Problem List   Diagnosis Date Noted   Idiopathic chronic gout, unspecified site, without tophus (tophi) 02/14/2020   Synovitis of left knee 02/09/2020   S/P cervical spinal fusion 01/03/2020   GERD (gastroesophageal reflux disease) 03/01/2017   Right Achilles tendinitis 12/21/2016   HTN (hypertension) 03/03/2016   OA (osteoarthritis) of knee 07/10/2013   Genital herpes 05/02/2013   CHEST PAIN 08/19/2009   LOW BACK PAIN 06/03/2009   ERECTILE DYSFUNCTION 06/07/2008   HYPERLIPIDEMIA 04/03/2008   DEPRESSION 04/03/2008   Allergic rhinitis 04/03/2008   INSOMNIA 04/03/2008   NEPHROLITHIASIS, HX OF 04/03/2008    Scot Jun, PT, DPT, OCS, ATC 04/11/20  12:15 PM    [facility] [address] [place], [place], [address] Phone: [phone]   Fax:  [phone]  Name: JAHSEH LUCCHESE MRN:  [id number] Date of Birth: Dec 10, 1960

## 2020-04-16 ENCOUNTER — Encounter: Payer: 59 | Admitting: Rehabilitative and Restorative Service Providers"

## 2020-04-18 ENCOUNTER — Ambulatory Visit: Payer: 59 | Admitting: Rehabilitative and Restorative Service Providers"

## 2020-04-18 ENCOUNTER — Encounter: Payer: Self-pay | Admitting: Rehabilitative and Restorative Service Providers"

## 2020-04-18 ENCOUNTER — Other Ambulatory Visit: Payer: Self-pay

## 2020-04-18 DIAGNOSIS — M542 Cervicalgia: Secondary | ICD-10-CM | POA: Diagnosis not present

## 2020-04-18 DIAGNOSIS — M25512 Pain in left shoulder: Secondary | ICD-10-CM

## 2020-04-18 DIAGNOSIS — M6281 Muscle weakness (generalized): Secondary | ICD-10-CM

## 2020-04-18 NOTE — Therapy (Signed)
[facility] [place], [place], [address] Phone: [phone]   Fax:  [phone]  Physical Therapy Treatment/Discharge  Patient Details  Name: Dale [name] MRN: [id number] Date of Birth: Aug 29, 1960 Referring Provider (PT): Dr Lorin Mercy   Encounter Date: 04/18/2020   PHYSICAL THERAPY DISCHARGE SUMMARY  Visits from Start of Care: 18  Current functional level related to goals / functional outcomes: See note   Remaining deficits: See note   Education / Equipment: HEP Plan: Patient agrees to discharge.  Patient goals were partially met. Patient is being discharged due to                                                     ?????  Pt. Has made progress but slowly due to nature of condition due to nerve innervation deficits.  Pt. Appropriate for home D/C for long term.         PT End of Session - 04/18/20 1151    Visit Number 18    Number of Visits 20    Date for PT Re-Evaluation 04/18/20    Progress Note Due on Visit 20    PT Start Time 1143    PT Stop Time 1221    PT Time Calculation (min) 38 min    Activity Tolerance Patient tolerated treatment well    Behavior During Therapy WFL for tasks assessed/performed           Past Medical History:  Diagnosis Date  . Allergic rhinitis, seasonal   . Anxiety   . Arthritis    back  . Depression   . Genital herpes   . GERD (gastroesophageal reflux disease)   . Gross hematuria   . Hiatal hernia   . History of chest pain    s/p  nuclear stress test, 08-26-2009, normal perfusion without ischemia , ef 58%, stated non-cardiac chest pain  . History of kidney stones   . History of nonmelanoma skin cancer    excision left leg SCC;  excision of chest BCC   . Hyperlipidemia   . Hypertension    followed by pcp   . Nocturia   . Urethral lesion    prostatic  . Wears glasses     Past Surgical History:  Procedure Laterality Date  . COLONOSCOPY  last one 07-01-2018  . CYSTOSCOPY  N/A 08/23/2019   Procedure: CYSTOSCOPY WITH TRANSURETHRAL BIOPSY;  Surgeon: Ceasar Mons, MD;  Location[facility];  Service: Urology;  Laterality: N/A;  . ESOPHAGOGASTRODUODENOSCOPY  05/28/2017  . KNEE ARTHROSCOPY Right 01/09/2013   Procedure: ARTHROSCOPY KNEE;  Surgeon: Marybelle Killings, MD;  Location: [facility];  Service: Orthopedics;  Laterality: Right;  Right knee scope, partial medial menisectomy  . KNEE ARTHROSCOPY Left 2007  . KNEE SURGERY Right 2774;  1989;  1990; 2001; last one @ [facility] 06-10-2000  . [place] SURGERY  2018  . POSTERIOR LUMBAR FUSION  12-21-2011  $RemoveBef'@MC'[email or web address]$    L4--5  . ROTATOR CUFF REPAIR Left 2003  . TOTAL KNEE ARTHROPLASTY Right 07/10/2013   Procedure: RIGHT TOTAL KNEE ARTHROPLASTY;  Surgeon: Gearlean Alf, MD;  Location: [facility];  Service: Orthopedics;  Laterality: Right;  [facility] Kitchen VASECTOMY      There were no vitals filed for this visit.   Subjective Assessment - 04/18/20 1149  Subjective Pt. stated he fell forward to wall, catching c Lt arm.  Pt. stated that created soreness in trap muscle c soreness for a few days.  Pt. stated no complaints from last.    Pertinent History RTC repair on Lt 2003ish per Pt.    Limitations Lifting;House hold activities;Other (comment)    Patient Stated Goals Improve Lt arm strength and use    Currently in Pain? No/denies    Pain Onset More than a month ago              [facility] PT Assessment - 04/18/20 0001      Assessment   Medical Diagnosis Lt shoulder pain/weakness s/p cervical fusion    Referring Provider (PT) Dr Lorin Mercy    Onset Date/Surgical Date 09/10/19    Hand Dominance Right      Observation/Other Assessments   Focus on Therapeutic Outcomes (FOTO)  update 62%      AROM   Overall AROM Comments WFL measured in supine      Strength   Overall Strength Comments dynamometer testing seated:  flexion (Lt 6 lbs)  abd (Lt 5.5 lbs) ER (Lt 12.7 lbs)    Left Shoulder Flexion 3+/5    Left Shoulder ABduction  3+/5    Left Shoulder Internal Rotation 5/5    Left Shoulder External Rotation 4/5                         [facility] Adult PT Treatment/Exercise - 04/18/20 0001      Exercises   Other Exercises  HEP review.      Neck Exercises: Machines for Strengthening   UBE (Upper Arm Bike) --      Shoulder Exercises: Prone   Other Prone Exercises prone retraction c GH ext 5 sec x 10, horizontal abd 5 sec hold x 10      Shoulder Exercises: Standing   Flexion Both   2 x 15   Shoulder Flexion Weight (lbs) 3    ABduction Both   2 x 15   Shoulder ABduction Weight (lbs) 2    Other Standing Exercises ER c flexion punch red band 3 x 10      Shoulder Exercises: ROM/Strengthening   UBE (Upper Arm Bike) Lvl 2.0 Lt UE only 3 mins fwd/back with 15 second interval each minute                  PT Education - 04/18/20 1203    Education Details HEP, d/c plan    Person(s) Educated Patient    Methods Explanation;Demonstration;Verbal cues;Handout    Comprehension Returned demonstration;Verbalized understanding            PT Short Term Goals - 03/26/20 1150      PT SHORT TERM GOAL #1   Title Patient will demonstrate independent use of home exercise program to maintain progress from in clinic treatments.    Time 3    Period Weeks    Status Achieved    Target Date 02/20/20             PT Long Term Goals - 04/18/20 1211      PT LONG TERM GOAL #1   Title Patient will demonstrate/report pain at worst less than or equal to 2/10 to facilitate minimal limitation in daily activity secondary to pain symptoms.    Time 5    Period Weeks    Status Partially Met      PT LONG TERM  GOAL #2   Title Patient will demonstrate independent use of home exercise program to facilitate ability to maintain/progress functional gains from skilled physical therapy services.    Time 5    Period Weeks    Status Achieved      PT LONG TERM GOAL #3   Title Pt. will demonstrate Lt [place] jt AROM WFL s  symptoms to facilitate usual overhead reaching, self care, ressing at PLOF s limitation.    Time 5    Period Weeks    Status Achieved      PT LONG TERM GOAL #4   Title Pt. will demonstrate Lt UE MMT > or = 4+/5 to facilitate ability to perform usual daily and workout activity at PLOF.    Time 5    Period Weeks    Status Partially Met      PT LONG TERM GOAL #5   Title Pt. will demonstrate Lt grip strength 85% of Lt to facilitate improved strength and function.    Time 5    Period Weeks    Status On-going      PT LONG TERM GOAL #6   Title Pt. will demonstrate FOTO outcome 70% status.    Baseline 62 - 04/18/2020 update    Time 5    Period Weeks    Status Partially Met                 Plan - 04/18/20 1213    Clinical Impression Statement Pt. has attended 18 visits overall to this point.  GROC reported at .  See objective data for updated information.  Pt. has demonstrated gains along the way in strength, mobility and FOTO outcome but did continue to demonstrate impairments which can impact daily activity.  Pt. to this point was knowledgeable of HEP and may be appropriate for HEP trial for long term gains.  Pt. has demonstrated progress towards goals, reaching some and partially reaching others.  Pt. given instructions to request return to clinic for re-evaluation if presentation worsened.    Personal Factors and Comorbidities Comorbidity 3+    Comorbidities HTN, hyperlipidemia, GERD    Examination-Activity Limitations Bathing;Carry;Lift;Dressing;Reach Overhead    Examination-Participation Restrictions Cleaning;Community Activity;Driving;Yard Work;Other    Stability/Clinical Decision Making Evolving/Moderate complexity    Rehab Potential Fair    PT Frequency 2x / week    PT Treatment/Interventions ADLs/Self Care Home Management;Cryotherapy;Electrical Stimulation;Moist Heat;Balance training;Therapeutic exercise;Iontophoresis 4mg /ml Dexamethasone;Therapeutic activities;Functional  mobility training;Stair training;Gait training;Ultrasound;DME Instruction;Neuromuscular re-education;Patient/family education;Manual techniques;Taping;Dry needling;Passive range of motion;Joint Manipulations    PT Next Visit Plan Trial D/C to HEP    PT Home Exercise Plan [email or web address]    Consulted and Agree with Plan of Care Patient           Patient will benefit from skilled therapeutic intervention in order to improve the following deficits and impairments:  Decreased endurance,Decreased activity tolerance,Decreased strength,Impaired UE functional use,Pain,Decreased mobility,Decreased range of motion,Impaired perceived functional ability,Postural dysfunction,Impaired flexibility,Decreased coordination  Visit Diagnosis: Cervicalgia  Left shoulder pain, unspecified chronicity  Muscle weakness (generalized)     Problem List Patient Active Problem List   Diagnosis Date Noted  . Idiopathic chronic gout, unspecified site, without tophus (tophi) 02/14/2020  . Synovitis of left knee 02/09/2020  . S/P cervical spinal fusion 01/03/2020  . GERD (gastroesophageal reflux disease) 03/01/2017  . Right Achilles tendinitis 12/21/2016  . HTN (hypertension) 03/03/2016  . OA (osteoarthritis) of knee 07/10/2013  . Genital herpes 05/02/2013  . CHEST PAIN 08/19/2009  . LOW BACK  PAIN 06/03/2009  . ERECTILE DYSFUNCTION 06/07/2008  . HYPERLIPIDEMIA 04/03/2008  . DEPRESSION 04/03/2008  . Allergic rhinitis 04/03/2008  . INSOMNIA 04/03/2008  . NEPHROLITHIASIS, HX OF 04/03/2008    Scot Jun, PT, DPT, OCS, ATC 04/18/20  12:28 PM    [facility] [address] [place], [place], [address] Phone: [phone]   Fax:  [phone]  Name: Dale [name] MRN: [id number] Date of Birth: 12/28/1960

## 2020-04-18 NOTE — Patient Instructions (Signed)
Access Code: [email or web address] URL: [email or web address] Date: 04/18/2020 Prepared by: Scot Jun  Exercises Supine Shoulder Horizontal Abduction with Resistance - 2 x daily - 7 x weekly - 10 reps - 3 sets Prone Single Arm Shoulder Y - 2 x daily - 7 x weekly - 3 sets - 10 reps Shoulder External Rotation Reactive Isometrics - 2 x daily - 7 x weekly - 1 sets - 10 reps - 5 hold Standing Bilateral Shoulder Scaption Wall Slide - 2 x daily - 7 x weekly - 3 sets - 10 reps Seated Thoracic Lumbar Extension - 2 x daily - 7 x weekly - 1 sets - 15 reps Standing Shoulder Flexion to 90 Degrees with Dumbbells - 1 x daily - 7 x weekly - 3 sets - 10-15 reps Scaption with Dumbbells - 1 x daily - 7 x weekly - 3 sets - 10-15 reps Prone Scapular Slide with Shoulder Extension - 1 x daily - 7 x weekly - 1 sets - 10 reps - 5 hold Prone Scapular Retraction Arms at Side - 1 x daily - 7 x weekly - 1 sets - 10 reps - 5 hold

## 2020-04-19 ENCOUNTER — Ambulatory Visit: Payer: 59 | Admitting: Family Medicine

## 2020-04-19 ENCOUNTER — Encounter: Payer: Self-pay | Admitting: Family Medicine

## 2020-04-19 VITALS — BP 142/90 | HR 125 | Temp 98.4°F | Wt 200.0 lb

## 2020-04-19 DIAGNOSIS — J301 Allergic rhinitis due to pollen: Secondary | ICD-10-CM | POA: Diagnosis not present

## 2020-04-19 DIAGNOSIS — R9389 Abnormal findings on diagnostic imaging of other specified body structures: Secondary | ICD-10-CM

## 2020-04-19 LAB — BASIC METABOLIC PANEL
BUN: 15 mg/dL (ref 6–23)
CO2: 25 mEq/L (ref 19–32)
Calcium: 9.6 mg/dL (ref 8.4–10.5)
Chloride: 106 mEq/L (ref 96–112)
Creatinine, Ser: 0.96 mg/dL (ref 0.40–1.50)
GFR: 87.33 mL/min (ref >60.00)
Glucose, Bld: 110 mg/dL — ABNORMAL HIGH (ref 70–99)
Potassium: 4.4 mEq/L (ref 3.5–5.1)
Sodium: 141 mEq/L (ref 135–145)

## 2020-04-19 MED ORDER — ALLOPURINOL 300 MG PO TABS
ORAL_TABLET | ORAL | 3 refills | Status: DC
Start: 2020-04-19 — End: 2021-05-02

## 2020-04-19 MED ORDER — METHYLPREDNISOLONE ACETATE 40 MG/ML IJ SUSP
40.0000 mg | Freq: Once | INTRAMUSCULAR | Status: AC
Start: 2020-04-19 — End: 2020-04-19
  Administered 2020-04-19: 40 mg via INTRAMUSCULAR

## 2020-04-19 MED ORDER — KETOCONAZOLE 2 % EX CREA: 1.0000 "application " | TOPICAL_CREAM | Freq: Every day | CUTANEOUS | 5 refills | Status: DC

## 2020-04-19 MED ORDER — METHYLPREDNISOLONE ACETATE 80 MG/ML IJ SUSP
80.0000 mg | Freq: Once | INTRAMUSCULAR | Status: AC
  Administered 2020-04-19: 80 mg via INTRAMUSCULAR
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
## 2020-05-06 ENCOUNTER — Ambulatory Visit
Admission: RE | Admit: 2020-05-06 | Discharge: 2020-05-06 | Disposition: A | Discharge status: Home / Self Care | Payer: 59 | Source: Ambulatory Visit | Attending: Family Medicine | Admitting: Family Medicine

## 2020-05-06 DIAGNOSIS — R9389 Abnormal findings on diagnostic imaging of other specified body structures: Secondary | ICD-10-CM

## 2020-05-06 MED ORDER — IOPAMIDOL (ISOVUE-300) INJECTION 61%
75.0000 mL | Freq: Once | INTRAVENOUS | Status: AC | PRN
  Administered 2020-05-06: 75 mL via INTRAVENOUS

## 2020-05-27 ENCOUNTER — Other Ambulatory Visit: Payer: Self-pay | Admitting: Family Medicine

## 2020-05-28 ENCOUNTER — Other Ambulatory Visit: Payer: Self-pay

## 2020-05-28 ENCOUNTER — Ambulatory Visit: Payer: 59 | Admitting: Family Medicine

## 2020-05-28 ENCOUNTER — Encounter: Payer: Self-pay | Admitting: Family Medicine

## 2020-05-28 VITALS — BP 128/82 | HR 106 | Temp 98.1°F | Wt 200.6 lb

## 2020-05-28 DIAGNOSIS — J301 Allergic rhinitis due to pollen: Secondary | ICD-10-CM | POA: Diagnosis not present

## 2020-05-28 MED ORDER — METHYLPREDNISOLONE ACETATE 80 MG/ML IJ SUSP
80.0000 mg | Freq: Once | INTRAMUSCULAR | Status: AC
Start: 2020-05-28 — End: 2020-05-28
  Administered 2020-05-28: 80 mg via INTRAMUSCULAR

## 2020-05-28 MED ORDER — VALACYCLOVIR HCL 500 MG PO TABS: 500.0000 mg | ORAL_TABLET | Freq: Every day | ORAL | 0 refills | Status: DC

## 2020-05-28 MED ORDER — METHYLPREDNISOLONE ACETATE 40 MG/ML IJ SUSP
40.0000 mg | Freq: Once | INTRAMUSCULAR | Status: AC
  Administered 2020-05-28: 40 mg via INTRAMUSCULAR

## 2020-05-28 MED ORDER — SILDENAFIL CITRATE 100 MG PO TABS: ORAL_TABLET | ORAL | 11 refills | Status: DC

## 2020-05-28 NOTE — Progress Notes (Signed)
   Subjective:    Patient ID: Dale [name], male    DOB: [birth year], 60 y.o.   MRN: [id number]  HPI Here for his typical allergies, causing burning and itching in the eyes, PND, and scratchy throat. Taking Claritin D.     Review of Systems  Constitutional: Negative.   HENT: Positive for congestion, postnasal drip, rhinorrhea, sneezing and sore throat. Negative for ear pain and facial swelling.   Eyes: Positive for itching. Negative for discharge.  Respiratory: Negative.   Cardiovascular: Negative.        Objective:   Physical Exam Constitutional:      Appearance: Normal appearance.  HENT:     Right Ear: Tympanic membrane, ear canal and external ear normal.     Left Ear: Tympanic membrane, ear canal and external ear normal.     Nose: Nose normal.     Mouth/Throat:     Pharynx: Oropharynx is clear.  Eyes:     Conjunctiva/sclera: Conjunctivae normal.  Pulmonary:     Effort: Pulmonary effort is normal.     Breath sounds: Normal breath sounds.  Lymphadenopathy:     Cervical: No cervical adenopathy.  Neurological:     Mental Status: He is alert.           Assessment & Plan:  Allergies, he is given a shot of DepoMedrol.  Alysia Penna, MD

## 2020-05-29 ENCOUNTER — Telehealth: Payer: Self-pay | Admitting: Family Medicine

## 2020-05-29 ENCOUNTER — Other Ambulatory Visit: Payer: Self-pay

## 2020-05-29 MED ORDER — VALACYCLOVIR HCL 500 MG PO TABS: 500.0000 mg | ORAL_TABLET | Freq: Every day | ORAL | 1 refills | Status: DC

## 2020-05-29 NOTE — Telephone Encounter (Signed)
valACYclovir (VALTREX) 500 MG tablet  [facility] [place] [place] to [address] [place] [facility] Phone:  [phone]  Fax:  [phone]

## 2020-05-29 NOTE — Telephone Encounter (Signed)
Refill has been sent to [facility]

## 2020-06-13 ENCOUNTER — Other Ambulatory Visit: Payer: Self-pay | Admitting: Family Medicine

## 2020-07-23 ENCOUNTER — Other Ambulatory Visit: Payer: Self-pay | Admitting: Family Medicine

## 2020-07-23 NOTE — Telephone Encounter (Signed)
Last refill-01/10/20-120tabs, 1 tab q6hrs, 5 refills Last office visit- 05/28/20   No future visit scheduled. Can this patient receive a refill?

## 2020-07-28 ENCOUNTER — Other Ambulatory Visit: Payer: Self-pay | Admitting: Family Medicine

## 2020-08-25 ENCOUNTER — Other Ambulatory Visit: Payer: Self-pay | Admitting: Family Medicine

## 2020-08-26 NOTE — Telephone Encounter (Signed)
Last refill- 06/13/20--90 tabs, 0 refills Last ov- 05/28/20 No recent lipid labs  No future office visit scheduled.   Can this patient receive a refill?

## 2020-12-03 ENCOUNTER — Other Ambulatory Visit: Payer: Self-pay | Admitting: Family Medicine

## 2021-02-04 ENCOUNTER — Other Ambulatory Visit: Payer: Self-pay | Admitting: Family Medicine

## 2021-02-04 NOTE — Telephone Encounter (Signed)
Pt LOV was done 05/28/2020 Last refill done on 07/24/2020 Please advise
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
## 2021-04-21 ENCOUNTER — Encounter: Payer: Self-pay | Admitting: Family Medicine

## 2021-04-21 ENCOUNTER — Ambulatory Visit: Payer: 59 | Admitting: Family Medicine

## 2021-04-21 VITALS — BP 130/84 | HR 111 | Temp 98.6°F | Wt 207.0 lb

## 2021-04-21 DIAGNOSIS — J301 Allergic rhinitis due to pollen: Secondary | ICD-10-CM | POA: Diagnosis not present

## 2021-04-21 DIAGNOSIS — G4730 Sleep apnea, unspecified: Secondary | ICD-10-CM | POA: Diagnosis not present

## 2021-04-21 MED ORDER — SILDENAFIL CITRATE 100 MG PO TABS: ORAL_TABLET | ORAL | 11 refills | Status: DC

## 2021-04-21 MED ORDER — METHYLPREDNISOLONE ACETATE 40 MG/ML IJ SUSP
40.0000 mg | Freq: Once | INTRAMUSCULAR | Status: AC
  Administered 2021-04-21: 40 mg via INTRAMUSCULAR

## 2021-04-21 MED ORDER — METHYLPREDNISOLONE ACETATE 80 MG/ML IJ SUSP
80.0000 mg | Freq: Once | INTRAMUSCULAR | Status: AC
  Administered 2021-04-21: 80 mg via INTRAMUSCULAR

## 2021-04-21 NOTE — Progress Notes (Signed)
? ?  Subjective:  ? ? Patient ID: Dale [name], male    DOB: 04/23/1960, 61 y.o.   MRN: [id number] ? ?HPI ?Here for several issues. First his spring time allergies are acting up and he requests a steroid shot. He is taking Claritin D daily. Also he complains of not sleeping well, of waking himself up at times feeling like he can't breathe, and of daytime sleepiness. He says he had a sleep study done around 2011 or so, but he cannot remember the results. I cannot access this in his chart.  ? ? ?Review of Systems  ?Constitutional:  Positive for fatigue.  ?HENT:  Positive for congestion, postnasal drip, rhinorrhea and sneezing.   ?Respiratory: Negative.    ?Cardiovascular: Negative.   ? ?   ?Objective:  ? Physical Exam ?Constitutional:   ?   Appearance: Normal appearance.  ?HENT:  ?   Right Ear: Tympanic membrane, ear canal and external ear normal.  ?   Left Ear: Tympanic membrane, ear canal and external ear normal.  ?   Nose: Nose normal.  ?   Mouth/Throat:  ?   Pharynx: Oropharynx is clear.  ?Eyes:  ?   Conjunctiva/sclera: Conjunctivae normal.  ?Cardiovascular:  ?   Rate and Rhythm: Normal rate and regular rhythm.  ?   Pulses: Normal pulses.  ?   Heart sounds: Normal heart sounds.  ?Pulmonary:  ?   Effort: Pulmonary effort is normal.  ?   Breath sounds: Normal breath sounds.  ?Lymphadenopathy:  ?   Cervical: No cervical adenopathy.  ?Neurological:  ?   Mental Status: He is alert.  ? ? ? ? ? ?   ?Assessment & Plan:  ?For his allergies, he is given a shot of DepoMedrol. Also it sounds like he has sleep apnea, so we will refer him for another evaluation. ?Alysia Penna, MD ? ? ?

## 2021-05-02 ENCOUNTER — Other Ambulatory Visit: Payer: Self-pay | Admitting: Family Medicine

## 2021-05-02 DIAGNOSIS — M1A0620 Idiopathic chronic gout, left knee, without tophus (tophi): Secondary | ICD-10-CM

## 2021-05-07 ENCOUNTER — Other Ambulatory Visit: Payer: Self-pay | Admitting: Family Medicine

## 2021-05-07 ENCOUNTER — Other Ambulatory Visit: Payer: Self-pay

## 2021-05-07 DIAGNOSIS — N529 Male erectile dysfunction, unspecified: Secondary | ICD-10-CM

## 2021-05-07 MED ORDER — SILDENAFIL CITRATE 100 MG PO TABS: ORAL_TABLET | ORAL | 3 refills | Status: DC

## 2021-05-12 ENCOUNTER — Encounter: Payer: Self-pay | Admitting: Adult Health

## 2021-05-12 ENCOUNTER — Ambulatory Visit (INDEPENDENT_AMBULATORY_CARE_PROVIDER_SITE_OTHER): Payer: 59 | Admitting: Adult Health

## 2021-05-12 DIAGNOSIS — R400 Somnolence: Secondary | ICD-10-CM | POA: Diagnosis not present

## 2021-05-12 NOTE — Patient Instructions (Signed)
Set up for home sleep study .  ?Work on healthy weight loss.  ?Do not drive if sleepy  ?Follow up in 6-8 weeks to discuss results and treatment plan .  ? ?

## 2021-05-12 NOTE — Progress Notes (Signed)
? ?'@Patient'$  ID: Dale [name], male    DOB: [birth year], 61 y.o.   MRN: [id number] ? ?Chief Complaint  ?Patient presents with  ? Consult  ? ? ?Referring provider: ?Laurey Morale, MD ? ?HPI: ?61 year old male seen for sleep consult May 12, 2021 for daytime sleepiness, restless sleep and snoring. ? ?TEST/EVENTS :  ? ?05/12/2021 Sleep consult  ?Patient presents for a sleep consult today.  He was calmly referred by his primary care provider Dr. Alysia Penna.  Patient complains he has very restless sleep.  Feels he restless during the night due to snoring and chronic back pain.  Patient says if he is on his back he stops breathing quite a bit during his sleep.  He has snoring and daytime sleepiness.  Typically goes to bed about 10 PM.  Can take an hour or 2 to go to sleep.  Patient is up to 5-6 times a night.  And finally gets up about 8 AM.  Weight is up about 8 pounds over the last 2 years.  Patient says he had a sleep study back in 2009 that was negative per patient. Report is unavailable.  ?Caffeine 1-2 cups of coffee.  Epworth score is 8 out of 24.  Mainly gets sleepy within activities.  Such as watching TV or reading. ?Naps some days in recliner. Feels tired a lot . Wakes up un-refreshed.  ?No symptoms suspicious for cataplexy or sleep paralysis. No teeth grinding . No removable dental issues. Takes Ativan '4mg'$  to help with insomnia - has severe insomnia. Trouble going to sleep.  ?No history of CHF or CVA.  ? ?Medical history is significant for hypertension, hyperlipidemia, chronic rhinitis, anxiety, depression, GERD, hiatal hernia, kidney stones, herpes , DJD-back/knee.  ? ?Surgical history significant for knee surgery, left rotator cuff repair.,  Posterior lumbar fusion 2013. Neck surgery 2021 .  ? ?SH : Retired from [employer]. 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 Divorced . 1 adult child. Never smoking . No drugs . Social alcohol .  ? ?FH : CAD, CVA, HLD, HTN, Lung cancer.  ? ? ?Allergies  ?Allergen Reactions  ?  Lisinopril Cough  ? ? ?Immunization History  ?Administered Date(s) Administered  ? Influenza Split 12/03/2012  ? PFIZER(Purple Top)SARS-COV-2 Vaccination 11/17/2019  ? Tdap 04/20/2013, 09/10/2019  ? ? ?Past Medical History:  ?Diagnosis Date  ? Allergic rhinitis, seasonal   ? Anxiety   ? Arthritis   ? back  ? Depression   ? Genital herpes   ? GERD (gastroesophageal reflux disease)   ? Gross hematuria   ? Hiatal hernia   ? History of chest pain   ? s/p  nuclear stress test, 08-26-2009, normal perfusion without ischemia , ef 58%, stated non-cardiac chest pain  ? History of kidney stones   ? History of nonmelanoma skin cancer   ? excision left leg SCC;  excision of chest BCC   ? Hyperlipidemia   ? Hypertension   ? followed by pcp   ? Nocturia   ? Urethral lesion   ? prostatic  ? Wears glasses   ? ? ?Tobacco History: ?Social History  ? ?Tobacco Use  ?Smoking Status Never  ?Smokeless Tobacco Never  ? ?Counseling given: Not Answered ? ? ?Outpatient Medications Prior to Visit  ?Medication Sig Dispense Refill  ? 5-Hydroxytryptophan (5-HTP PO) Take by mouth.    ? allopurinol (ZYLOPRIM) 300 MG tablet TAKE 1 TABLET DAILY 90 tablet 0  ? amLODipine (NORVASC) 5 MG tablet TAKE 1  TABLET DAILY        (SCHEDULE A PHYSICAL FOR   MORE REFILLS) 90 tablet 3  ? atorvastatin (LIPITOR) 10 MG tablet TAKE 1 TABLET EVERY MORNING 90 tablet 3  ? LORazepam (ATIVAN) 2 MG tablet TAKE 1 TABLET BY MOUTH EVERY 6 HOURS AS NEEDED 120 tablet 5  ? Multiple Vitamin (ONE-A-DAY MENS PO) Take 2 capsules by mouth daily.     ? omeprazole (PRILOSEC) 20 MG capsule Take 20 mg by mouth daily.    ? testosterone cypionate (DEPOTESTOTERONE CYPIONATE) 100 MG/ML injection Inject into the muscle once a week. For IM use only    ? vitamin B-12 (CYANOCOBALAMIN) 1000 MCG tablet Take 1,000 mcg by mouth once a week.    ? colchicine 0.6 MG tablet Take 1 tablet (0.6 mg total) by mouth daily. Take for acute gout one bid to tid (Patient not taking: Reported on 05/12/2021) 30 tablet  1  ? hydrocortisone cream 1 % Apply 1 application topically as needed for itching. (Patient not taking: Reported on 05/12/2021)    ? ketoconazole (NIZORAL) 2 % cream Apply 1 application topically daily. (Patient not taking: Reported on 05/12/2021) 30 g 5  ? loratadine-pseudoephedrine (CLARITIN-D 12 HOUR) 5-120 MG tablet Take 1 tablet by mouth 2 (two) times daily as needed for allergies. (Patient not taking: Reported on 05/12/2021) 2 tablet 0  ? sildenafil (VIAGRA) 100 MG tablet TAKE 1 TABLET DAILY AS     NEEDED FOR ERECTILE        DYSFUNCTION (Patient not taking: Reported on 05/12/2021) 30 tablet 3  ? valACYclovir (VALTREX) 500 MG tablet TAKE 1 TABLET DAILY (Patient not taking: Reported on 05/12/2021) 90 tablet 1  ? ?No facility-administered medications prior to visit.  ? ? ? ?Review of Systems:  ? ?Constitutional:   No  weight loss, night sweats,  Fevers, chills, fatigue, or  lassitude. ? ?HEENT:   No headaches,  Difficulty swallowing,  Tooth/dental problems, or  Sore throat,  ?              No sneezing, itching, ear ache, nasal congestion, post nasal drip,  ? ?CV:  No chest pain,  Orthopnea, PND, swelling in lower extremities, anasarca, dizziness, palpitations, syncope.  ? ?GI  No heartburn, indigestion, abdominal pain, nausea, vomiting, diarrhea, change in bowel habits, loss of appetite, bloody stools.  ? ?Resp: No shortness of breath with exertion or at rest.  No excess mucus, no productive cough,  No non-productive cough,  No coughing up of blood.  No change in color of mucus.  No wheezing.  No chest wall deformity ? ?Skin: no rash or lesions. ? ?GU: no dysuria, change in color of urine, no urgency or frequency.  No flank pain, no hematuria  ? ?MS:  No joint pain or swelling.  No decreased range of motion.  No back pain. ? ? ? ?Physical Exam ? ?BP 138/70 (BP Location: Left Arm, Patient Position: Sitting, Cuff Size: Large)   Pulse (!) 104   Temp 98 ?F (36.7 ?C) (Oral)   Ht '5\' 11"'$  (1.803 m)   Wt 201 lb 6.4 oz  (91.4 kg)   SpO2 100%   BMI 28.09 kg/m?  ? ?GEN: A/Ox3; pleasant , NAD, well nourished  ?  ?HEENT:  [place]/AT,  EACs-clear, TMs-wnl, NOSE-clear, THROAT-clear, no lesions, no postnasal drip or exudate noted. Class 2-3 MP airway  ? ?NECK:  Supple w/ fair ROM; no JVD; normal carotid impulses w/o bruits; no thyromegaly or nodules palpated; no  lymphadenopathy.   ? ?RESP  Clear  P & A; w/o, wheezes/ rales/ or rhonchi. no accessory muscle use, no dullness to percussion ? ?CARD:  RRR, no m/r/g, no peripheral edema, pulses intact, no cyanosis or clubbing. ? ?GI:   Soft & nt; nml bowel sounds; no organomegaly or masses detected.  ? ?Musco: Warm bil, no deformities or joint swelling noted.  ? ?Neuro: alert, no focal deficits noted.   ? ?Skin: Warm, no lesions or rashes ? ? ? ? ? ? ? ? ? ? ?Assessment & Plan:  ? ?No problem-specific Assessment & Plan notes found for this encounter. ? ? ? ? ?Rexene Edison, NP ?05/12/2021 ? ?

## 2021-05-14 NOTE — Assessment & Plan Note (Signed)
Daytime sleepiness, restless sleep, snoring are all suspicious for underlying sleep apnea. ?We will set patient up for home sleep study.  Patient education on sleep apnea ? ?- discussed how weight can impact sleep and risk for sleep disordered breathing ?- discussed options to assist with weight loss: combination of diet modification, cardiovascular and strength training exercises ?  ?- had an extensive discussion regarding the adverse health consequences related to untreated sleep disordered breathing ?- specifically discussed the risks for hypertension, coronary artery disease, cardiac dysrhythmias, cerebrovascular disease, and diabetes ?- lifestyle modification discussed ?  ?- discussed how sleep disruption can increase risk of accidents, particularly when driving ?- safe driving practices were discussed ?  ?Plan  ?Patient Instructions  ?Set up for home sleep study .  ?Work on healthy weight loss.  ?Do not drive if sleepy  ?Follow up in 6-8 weeks to discuss results and treatment plan .  ? ?  ? ?

## 2021-05-27 ENCOUNTER — Ambulatory Visit: Payer: 59

## 2021-05-27 DIAGNOSIS — G4733 Obstructive sleep apnea (adult) (pediatric): Secondary | ICD-10-CM

## 2021-05-27 DIAGNOSIS — R400 Somnolence: Secondary | ICD-10-CM

## 2021-05-28 ENCOUNTER — Ambulatory Visit: Payer: 59 | Admitting: Family Medicine

## 2021-05-30 DIAGNOSIS — G4733 Obstructive sleep apnea (adult) (pediatric): Secondary | ICD-10-CM | POA: Diagnosis not present

## 2021-06-01 ENCOUNTER — Other Ambulatory Visit: Payer: Self-pay | Admitting: Family Medicine

## 2021-06-02 ENCOUNTER — Encounter: Payer: Self-pay | Admitting: Family Medicine

## 2021-06-02 ENCOUNTER — Ambulatory Visit: Payer: 59 | Admitting: Family Medicine

## 2021-06-02 VITALS — BP 126/76 | HR 99 | Temp 97.9°F | Wt 200.0 lb

## 2021-06-02 DIAGNOSIS — J301 Allergic rhinitis due to pollen: Secondary | ICD-10-CM

## 2021-06-02 MED ORDER — METHYLPREDNISOLONE ACETATE 40 MG/ML IJ SUSP
40.0000 mg | Freq: Once | INTRAMUSCULAR | Status: AC
  Administered 2021-06-02: 40 mg via INTRAMUSCULAR

## 2021-06-02 MED ORDER — METHYLPREDNISOLONE ACETATE 80 MG/ML IJ SUSP
80.0000 mg | Freq: Once | INTRAMUSCULAR | Status: AC
  Administered 2021-06-02: 80 mg via INTRAMUSCULAR

## 2021-06-02 NOTE — Progress Notes (Signed)
? ?  Subjective:  ? ? Patient ID: Dale [name], male    DOB: 11/15/1960, 61 y.o.   MRN: [id number] ? ?HPI ?Here for his usual allergies, causing itchy eyes, PND, and sneezing.  ? ? ?Review of Systems  ?Constitutional: Negative.   ?HENT:  Positive for congestion, postnasal drip, rhinorrhea and sneezing.   ?Eyes:  Positive for itching.  ?Respiratory: Negative.    ? ?   ?Objective:  ? Physical Exam ?Constitutional:   ?   Appearance: Normal appearance.  ?HENT:  ?   Right Ear: Tympanic membrane, ear canal and external ear normal.  ?   Left Ear: Tympanic membrane, ear canal and external ear normal.  ?   Mouth/Throat:  ?   Pharynx: Oropharynx is clear.  ?Eyes:  ?   Conjunctiva/sclera: Conjunctivae normal.  ?Pulmonary:  ?   Effort: Pulmonary effort is normal.  ?   Breath sounds: Normal breath sounds.  ?Neurological:  ?   Mental Status: He is alert.  ? ? ? ? ? ?   ?Assessment & Plan:  ?Seasonal allergies, given a shot of DepoMedrol. ?Alysia Penna, MD ? ? ?

## 2021-06-09 ENCOUNTER — Telehealth: Payer: Self-pay | Admitting: Adult Health

## 2021-06-09 ENCOUNTER — Encounter: Payer: Self-pay | Admitting: Adult Health

## 2021-06-09 ENCOUNTER — Ambulatory Visit: Payer: 59 | Admitting: Adult Health

## 2021-06-09 VITALS — BP 136/70 | HR 97 | Temp 97.9°F | Ht 71.0 in | Wt 199.4 lb

## 2021-06-09 DIAGNOSIS — G4733 Obstructive sleep apnea (adult) (pediatric): Secondary | ICD-10-CM

## 2021-06-09 DIAGNOSIS — R400 Somnolence: Secondary | ICD-10-CM

## 2021-06-09 NOTE — Patient Instructions (Addendum)
Begin CPAP at bedtime Goal is to wear your CPAP all night long at least 6 hours or more Healthy sleep regimen Work on healthy weight Do not drive if sleepy Follow-up in 2 to 3 months and as needed.

## 2021-06-09 NOTE — Assessment & Plan Note (Signed)
Severe sleep apnea patient education given on sleep apnea and treatment options.  Patient will proceed with CPAP therapy.  Begin CPAP AutoSet 5 to 15 cm H2O. - discussed how weight can impact sleep and risk for sleep disordered breathing - discussed options to assist with weight loss: combination of diet modification, cardiovascular and strength training exercises   - had an extensive discussion regarding the adverse health consequences related to untreated sleep disordered breathing - specifically discussed the risks for hypertension, coronary artery disease, cardiac dysrhythmias, cerebrovascular disease, and diabetes - lifestyle modification discussed   - discussed how sleep disruption can increase risk of accidents, particularly when driving - safe driving practices were discussed    Plan  Patient Instructions  Begin CPAP at bedtime Goal is to wear your CPAP all night long at least 6 hours or more Healthy sleep regimen Work on healthy weight Do not drive if sleepy Follow-up in 2 to 3 months and as needed.

## 2021-06-09 NOTE — Telephone Encounter (Signed)
Called and spoke with patient who states that he did not tell Tammy today during the visit that he was told years ago that he has a Deviated septum and also has had a vertebrae in neck fused as well. He states he was not sure if either of those would play a role in his sleep apnea.  Tammy please advise

## 2021-06-09 NOTE — Progress Notes (Signed)
$'@Patient'T$  ID: Dale [name], male    DOB: Jan 12, 1961, 61 y.o.   MRN: [id number]  Chief Complaint  Patient presents with   Follow-up    Referring provider: Laurey Morale, MD  HPI: 61 year old male seen for sleep consult May 12, 2021 for snoring, daytime sleepiness and restless sleep.  He was set up for home sleep study that showed severe sleep apnea.  TEST/EVENTS :  Home sleep study May 27, 2021 showed severe obstructive sleep apnea with a AHI of 41.6/hour and SPO2 low at 68%.  06/09/2021 Follow up : OSA  Patient returns for a 1 month follow-up.  Patient was seen last visit for a sleep consult.  Patient had snoring, restless sleep and daytime sleepiness.  Patient was set up for home sleep study that was completed on May 27, 2021.  This showed very severe sleep apnea with AHI at 41.6/hour and SPO2 low at 68%.  We discussed his sleep study results and went over treatment options including weight loss, oral appliance and CPAP.  Due to patient's severity of sleep apnea we discussed using a CPAP machine.  Patient is in agreement.  Patient education was provided on CPAP care and usage   Allergies  Allergen Reactions   Lisinopril Cough    Immunization History  Administered Date(s) Administered   Influenza Split 12/03/2012   PFIZER(Purple Top)SARS-COV-2 Vaccination 11/17/2019   Tdap 04/20/2013, 09/10/2019    Past Medical History:  Diagnosis Date   Allergic rhinitis, seasonal    Anxiety    Arthritis    back   Depression    Genital herpes    GERD (gastroesophageal reflux disease)    Gross hematuria    Hiatal hernia    History of chest pain    s/p  nuclear stress test, 08-26-2009, normal perfusion without ischemia , ef 58%, stated non-cardiac chest pain   History of kidney stones    History of nonmelanoma skin cancer    excision left leg SCC;  excision of chest BCC    Hyperlipidemia    Hypertension    followed by pcp    Nocturia    Urethral lesion    prostatic   Wears  glasses     Tobacco History: Social History   Tobacco Use  Smoking Status Never  Smokeless Tobacco Never   Counseling given: Not Answered   Outpatient Medications Prior to Visit  Medication Sig Dispense Refill   5-Hydroxytryptophan (5-HTP PO) Take by mouth.     allopurinol (ZYLOPRIM) 300 MG tablet TAKE 1 TABLET DAILY 90 tablet 0   amLODipine (NORVASC) 5 MG tablet TAKE 1 TABLET DAILY        (SCHEDULE A PHYSICAL FOR   MORE REFILLS) 90 tablet 3   atorvastatin (LIPITOR) 10 MG tablet TAKE 1 TABLET EVERY MORNING 90 tablet 3   colchicine 0.6 MG tablet Take 1 tablet (0.6 mg total) by mouth daily. Take for acute gout one bid to tid 30 tablet 1   hydrocortisone cream 1 % Apply 1 application. topically as needed for itching.     ketoconazole (NIZORAL) 2 % cream Apply 1 application topically daily. 30 g 5   LORazepam (ATIVAN) 2 MG tablet TAKE 1 TABLET BY MOUTH EVERY 6 HOURS AS NEEDED 120 tablet 5   Multiple Vitamin (ONE-A-DAY MENS PO) Take 2 capsules by mouth daily.      omeprazole (PRILOSEC) 20 MG capsule Take 20 mg by mouth daily.     sildenafil (VIAGRA) 100 MG tablet TAKE  1 TABLET DAILY AS     NEEDED FOR ERECTILE        DYSFUNCTION 30 tablet 3   testosterone cypionate (DEPOTESTOTERONE CYPIONATE) 100 MG/ML injection Inject into the muscle once a week. For IM use only     valACYclovir (VALTREX) 500 MG tablet TAKE 1 TABLET DAILY 30 tablet 0   vitamin B-12 (CYANOCOBALAMIN) 1000 MCG tablet Take 1,000 mcg by mouth once a week.     loratadine-pseudoephedrine (CLARITIN-D 12 HOUR) 5-120 MG tablet Take 1 tablet by mouth 2 (two) times daily as needed for allergies. (Patient not taking: Reported on 06/02/2021) 2 tablet 0   No facility-administered medications prior to visit.     Review of Systems:   Constitutional:   No  weight loss, night sweats,  Fevers, chills, fatigue, or  lassitude.  HEENT:   No headaches,  Difficulty swallowing,  Tooth/dental problems, or  Sore throat,                No  sneezing, itching, ear ache, nasal congestion, post nasal drip,   CV:  No chest pain,  Orthopnea, PND, swelling in lower extremities, anasarca, dizziness, palpitations, syncope.   GI  No heartburn, indigestion, abdominal pain, nausea, vomiting, diarrhea, change in bowel habits, loss of appetite, bloody stools.   Resp: No shortness of breath with exertion or at rest.  No excess mucus, no productive cough,  No non-productive cough,  No coughing up of blood.  No change in color of mucus.  No wheezing.  No chest wall deformity  Skin: no rash or lesions.  GU: no dysuria, change in color of urine, no urgency or frequency.  No flank pain, no hematuria   MS:  No joint pain or swelling.  No decreased range of motion.  No back pain.    Physical Exam  BP 136/70 (BP Location: Left Arm, Patient Position: Sitting, Cuff Size: Large)   Pulse 97   Temp 97.9 F (36.6 C) (Oral)   Ht '5\' 11"'$  (1.803 m)   Wt 199 lb 6.4 oz (90.4 kg)   SpO2 98%   BMI 27.81 kg/m   GEN: A/Ox3; pleasant , NAD, well nourished    HEENT:  [place]/AT,  EACs-clear, TMs-wnl, NOSE-clear, THROAT-clear, no lesions, no postnasal drip or exudate noted. Class 3 MP airway   NECK:  Supple w/ fair ROM; no JVD; normal carotid impulses w/o bruits; no thyromegaly or nodules palpated; no lymphadenopathy.    RESP  Clear  P & A; w/o, wheezes/ rales/ or rhonchi. no accessory muscle use, no dullness to percussion  CARD:  RRR, no m/r/g, no peripheral edema, pulses intact, no cyanosis or clubbing.  GI:   Soft & nt; nml bowel sounds; no organomegaly or masses detected.   Musco: Warm bil, no deformities or joint swelling noted.   Neuro: alert, no focal deficits noted.    Skin: Warm, no lesions or rashes    Lab Results:  CBC   BNP No results found for: BNP  ProBNP No results found for: PROBNP         Assessment & Plan:   OSA (obstructive sleep apnea) Severe sleep apnea patient education given on sleep apnea and treatment  options.  Patient will proceed with CPAP therapy.  Begin CPAP AutoSet 5 to 15 cm H2O. - discussed how weight can impact sleep and risk for sleep disordered breathing - discussed options to assist with weight loss: combination of diet modification, cardiovascular and strength training exercises   -  had an extensive discussion regarding the adverse health consequences related to untreated sleep disordered breathing - specifically discussed the risks for hypertension, coronary artery disease, cardiac dysrhythmias, cerebrovascular disease, and diabetes - lifestyle modification discussed   - discussed how sleep disruption can increase risk of accidents, particularly when driving - safe driving practices were discussed    Plan  Patient Instructions  Begin CPAP at bedtime Goal is to wear your CPAP all night long at least 6 hours or more Healthy sleep regimen Work on healthy weight Do not drive if sleepy Follow-up in 2 to 3 months and as needed.        Rexene Edison, NP 06/09/2021

## 2021-06-10 NOTE — Telephone Encounter (Signed)
Called and answered his questions

## 2021-06-18 ENCOUNTER — Telehealth: Payer: Self-pay | Admitting: Adult Health

## 2021-06-18 NOTE — Telephone Encounter (Signed)
PCCs can we do this?

## 2021-06-18 NOTE — Telephone Encounter (Signed)
I spoke with Dale [name] and explained that [facility] doesn't take [employer] and that his Cpap order was sent to [facility]. I gave him their phone number to call next week if they don't call him

## 2021-06-24 ENCOUNTER — Other Ambulatory Visit: Payer: Self-pay | Admitting: Family Medicine

## 2021-07-10 ENCOUNTER — Telehealth: Payer: Self-pay | Admitting: Adult Health

## 2021-07-11 NOTE — Telephone Encounter (Signed)
Called and spoke to patient and advised patient that he call [facility] to get an update and status of his cpap. The order was sent and received on 5/22. Patient verbalized understanding. Nothing further needed

## 2021-07-16 ENCOUNTER — Telehealth: Payer: Self-pay | Admitting: Adult Health

## 2021-07-16 NOTE — Telephone Encounter (Signed)
Patient is calling stating that [facility] never got order for his CPAP. I called [facility] and they said they don't have anyone in their system with that name. Looks like order was sent to them on 06/09/21.   Please advise

## 2021-07-16 NOTE — Telephone Encounter (Signed)
[facility] called back and states that he is not in the system yet but they did receive the CPAP order and that they have told the patient they are 3 months behind on orders. Called and spoke with patient and he states that someone just called him and told him the same information. Advised him to call if anything else was needed. Nothing further needed at this time.

## 2021-07-17 ENCOUNTER — Other Ambulatory Visit: Payer: Self-pay | Admitting: Family Medicine

## 2021-07-31 ENCOUNTER — Other Ambulatory Visit: Payer: Self-pay | Admitting: Family Medicine

## 2021-07-31 DIAGNOSIS — M1A0620 Idiopathic chronic gout, left knee, without tophus (tophi): Secondary | ICD-10-CM

## 2021-08-11 ENCOUNTER — Ambulatory Visit: Payer: 59 | Admitting: Adult Health

## 2021-08-13 ENCOUNTER — Other Ambulatory Visit: Payer: Self-pay | Admitting: Family Medicine

## 2021-08-27 ENCOUNTER — Other Ambulatory Visit: Payer: Self-pay | Admitting: Family Medicine

## 2021-08-27 NOTE — Telephone Encounter (Signed)
Last OV-06/02/21 Last refill- 02/05/21--120 tabs, 5 refills  No future OV scheduled

## 2021-08-30 ENCOUNTER — Other Ambulatory Visit: Payer: Self-pay | Admitting: Family Medicine

## 2021-09-03 ENCOUNTER — Telehealth: Payer: Self-pay | Admitting: Family Medicine

## 2021-09-03 ENCOUNTER — Other Ambulatory Visit: Payer: Self-pay

## 2021-09-03 MED ORDER — VALACYCLOVIR HCL 500 MG PO TABS: 500.0000 mg | ORAL_TABLET | Freq: Every day | ORAL | 0 refills | Status: DC

## 2021-09-03 NOTE — Telephone Encounter (Addendum)
Called patient lvm 90 day supply for Valtrex sent to [facility]

## 2021-09-03 NOTE — Telephone Encounter (Signed)
Pt called to say he always gets a 90 day supply for each of his medications with [facility], but for some reason he only received a 30 day supply of the valACYclovir (VALTREX) 500 MG tablet  Last OV:  06/02/21  Please advise   [facility] [place] [place] [facility] [place] [address] Phone:  [phone]  Fax:  [phone]

## 2021-10-22 ENCOUNTER — Other Ambulatory Visit: Payer: Self-pay | Admitting: Family Medicine

## 2021-10-22 DIAGNOSIS — M1A0620 Idiopathic chronic gout, left knee, without tophus (tophi): Secondary | ICD-10-CM

## 2021-11-21 ENCOUNTER — Other Ambulatory Visit: Payer: Self-pay | Admitting: Family Medicine

## 2021-12-03 ENCOUNTER — Ambulatory Visit (INDEPENDENT_AMBULATORY_CARE_PROVIDER_SITE_OTHER): Payer: 59

## 2021-12-03 ENCOUNTER — Encounter: Payer: Self-pay | Admitting: Orthopaedic Surgery

## 2021-12-03 ENCOUNTER — Ambulatory Visit: Payer: 59 | Admitting: Orthopaedic Surgery

## 2021-12-03 VITALS — BP 138/83 | HR 88 | Ht 71.0 in | Wt 198.0 lb

## 2021-12-03 DIAGNOSIS — M25562 Pain in left knee: Secondary | ICD-10-CM

## 2021-12-03 DIAGNOSIS — M659 Synovitis and tenosynovitis, unspecified: Secondary | ICD-10-CM | POA: Diagnosis not present

## 2021-12-03 DIAGNOSIS — G8929 Other chronic pain: Secondary | ICD-10-CM

## 2021-12-03 LAB — BASIC METABOLIC PANEL
BUN: 13 mg/dL (ref 7–25)
CO2: 27 mmol/L (ref 20–32)
Calcium: 9.5 mg/dL (ref 8.6–10.3)
Chloride: 105 mmol/L (ref 98–110)
Creat: 0.97 mg/dL (ref 0.70–1.30)
Glucose, Bld: 100 mg/dL — ABNORMAL HIGH (ref 65–99)
Potassium: 4.8 mmol/L (ref 3.5–5.3)
Sodium: 139 mmol/L (ref 135–146)

## 2021-12-03 LAB — URIC ACID: Uric Acid, Serum: 3.3 mg/dL — ABNORMAL LOW (ref 4.0–8.0)

## 2021-12-03 NOTE — Progress Notes (Unsigned)
Office Visit Note   Patient: Dale [name]           Date of Birth: 09/28/1960           MRN: [id number] Visit Date: 12/03/2021              Requested by: Laurey Morale, MD [place],   [id number] PCP: Laurey Morale, MD   Assessment & Plan: Visit Diagnoses:  1. Chronic pain of left knee     Plan: Left knee injection performed which did well.  We will check a be met in the uric acid.  I will call him with the lab results.  If he has persistent problems with his knee he will call and let us know.  Follow-Up Instructions: No follow-ups on file.   Orders:  Orders Placed This Encounter  Procedures   XR KNEE 3 VIEW LEFT   No orders of the defined types were placed in this encounter.     Procedures: Large Joint Inj: L knee on 12/03/2021 10:48 AM Indications: joint swelling and pain Details: 22 G 1.5 in needle, anterolateral approach  Arthrogram: No  Medications: 0.5 mL lidocaine 1 %; 3 mL bupivacaine 0.5 %; 40 mg methylPREDNISolone acetate 40 MG/ML Outcome: tolerated well, no immediate complications Procedure, treatment alternatives, risks and benefits explained, specific risks discussed. Consent was given by the patient. Immediately prior to procedure a time out was called to verify the correct patient, procedure, equipment, support staff and site/side marked as required. Patient was prepped and draped in the usual sterile fashion.       Clinical Data: No additional findings.   Subjective: Chief Complaint  Patient presents with   Left Knee - Pain    HPI 61 year old male with history of gout on allopurinol 300 mg for several years has had increased left knee pain.  He has not noticed any increased warmth his pain is primarily been anteriorly and somewhat medial.  No locking.  He states is similar to when he had meniscus tear in his opposite knee.  Last uric acid was over a year ago.  No pain in his feet but he has had gout in his great toe in  the past.  He denies groin pain no associated back pain.  Review of Systems all systems are noncontributory to HPI.   Objective: Vital Signs: BP 138/83   Pulse 88   Ht _0  (1.803 m)   Wt 198 lb (89.8 kg)   BMI 27.62 kg/m   Physical Exam Constitutional:      Appearance: He is well-developed.  HENT:     Head: Normocephalic and atraumatic.     Right Ear: External ear normal.     Left Ear: External ear normal.  Eyes:     Pupils: Pupils are equal, round, and reactive to light.  Neck:     Thyroid: No thyromegaly.     Trachea: No tracheal deviation.  Cardiovascular:     Rate and Rhythm: Normal rate.  Pulmonary:     Effort: Pulmonary effort is normal.     Breath sounds: No wheezing.  Abdominal:     General: Bowel sounds are normal.     Palpations: Abdomen is soft.  Musculoskeletal:     Cervical back: Neck supple.  Skin:    General: Skin is warm and dry.     Capillary Refill: Capillary refill takes less than 2 seconds.  Neurological:     Mental Status: He  is alert and oriented to person, place, and time.  Psychiatric:        Behavior: Behavior normal.        Thought Content: Thought content normal.        Judgment: Judgment normal.     Ortho Exam mild left knee medial joint line tenderness trace effusion.  Mild crepitus with knee extension and patellofemoral loading.  Distal pulses are normal negative logroll the hips.  Opposite right knee shows well-healed midline incision.  Specialty Comments:  No specialty comments available.  Imaging: No results found.   PMFS History: Patient Active Problem List   Diagnosis Date Noted   OSA (obstructive sleep apnea) 06/09/2021   Daytime sleepiness 05/12/2021   Idiopathic chronic gout, unspecified site, without tophus (tophi) 02/14/2020   Synovitis of left knee 02/09/2020   S/P cervical spinal fusion 01/03/2020   GERD (gastroesophageal reflux disease) 03/01/2017   Right Achilles tendinitis 12/21/2016   HTN (hypertension)  03/03/2016   OA (osteoarthritis) of knee 07/10/2013   Genital herpes 05/02/2013   CHEST PAIN 08/19/2009   LOW BACK PAIN 06/03/2009   ERECTILE DYSFUNCTION 06/07/2008   HYPERLIPIDEMIA 04/03/2008   DEPRESSION 04/03/2008   Allergic rhinitis 04/03/2008   INSOMNIA 04/03/2008   NEPHROLITHIASIS, HX OF 04/03/2008   Past Medical History:  Diagnosis Date   Allergic rhinitis, seasonal    Anxiety    Arthritis    back   Depression    Genital herpes    GERD (gastroesophageal reflux disease)    Gross hematuria    Hiatal hernia    History of chest pain    s/p  nuclear stress test, 08-26-2009, normal perfusion without ischemia , ef 58%, stated non-cardiac chest pain   History of kidney stones    History of nonmelanoma skin cancer    excision left leg SCC;  excision of chest BCC    Hyperlipidemia    Hypertension    followed by pcp    Nocturia    Urethral lesion    prostatic   Wears glasses     Family History  Problem Relation Age of Onset   Heart attack Father    Coronary artery disease Father    Heart disease Father    Depression Other        family hx   Hyperlipidemia Other        family hx   Hypertension Other        family hx   Lung cancer Other        family hx   Stroke Other        family hx   Hypertension Mother    Hyperlipidemia Mother    Colon cancer Neg Hx    Esophageal cancer Neg Hx    Rectal cancer Neg Hx    Stomach cancer Neg Hx    Colon polyps Neg Hx     Past Surgical History:  Procedure Laterality Date   COLONOSCOPY  last one 07-01-2018   CYSTOSCOPY N/A 08/23/2019   Procedure: CYSTOSCOPY WITH TRANSURETHRAL BIOPSY;  Surgeon: Ceasar Mons, MD;  Location[facility];  Service: Urology;  Laterality: N/A;   ESOPHAGOGASTRODUODENOSCOPY  05/28/2017   KNEE ARTHROSCOPY Right 01/09/2013   Procedure: ARTHROSCOPY KNEE;  Surgeon: Marybelle Killings, MD;  Location: [place];  Service: Orthopedics;  Laterality: Right;  Right knee scope, partial medial  menisectomy   KNEE ARTHROSCOPY Left 2007   KNEE SURGERY Right 1983;  1989;  1990; 2001; last one @  [facility] 06-10-2000   LUMBAR DISC SURGERY  2018   POSTERIOR LUMBAR FUSION  12-21-2011  _0    L4--5   ROTATOR CUFF REPAIR Left 2003   TOTAL KNEE ARTHROPLASTY Right 07/10/2013   Procedure: RIGHT TOTAL KNEE ARTHROPLASTY;  Surgeon: Gearlean Alf, MD;  Location: [facility];  Service: Orthopedics;  Laterality: Right;   VASECTOMY     Social History   Occupational History   Not on file  Tobacco Use   Smoking status: Never   Smokeless tobacco: Never  Vaping Use   Vaping Use: Never used  Substance and Sexual Activity   Alcohol use: Yes    Comment: occasional   Drug use: Never   Sexual activity: Yes    Comment: VASECTOMY

## 2021-12-04 MED ORDER — LIDOCAINE HCL 1 % IJ SOLN
0.5000 mL | INTRAMUSCULAR | Status: AC | PRN
  Administered 2021-12-03: .5 mL

## 2021-12-04 MED ORDER — METHYLPREDNISOLONE ACETATE 40 MG/ML IJ SUSP
40.0000 mg | INTRAMUSCULAR | Status: AC | PRN
  Administered 2021-12-03: 40 mg via INTRA_ARTICULAR

## 2021-12-04 MED ORDER — BUPIVACAINE HCL 0.5 % IJ SOLN
3.0000 mL | INTRAMUSCULAR | Status: AC | PRN
  Administered 2021-12-03: 3 mL via INTRA_ARTICULAR

## 2021-12-15 ENCOUNTER — Encounter: Payer: Self-pay | Admitting: Family Medicine

## 2021-12-15 ENCOUNTER — Ambulatory Visit (INDEPENDENT_AMBULATORY_CARE_PROVIDER_SITE_OTHER): Payer: 59 | Admitting: Family Medicine

## 2021-12-15 ENCOUNTER — Other Ambulatory Visit: Payer: 59

## 2021-12-15 VITALS — BP 124/80 | HR 73 | Temp 98.3°F | Ht 70.25 in | Wt 199.0 lb

## 2021-12-15 DIAGNOSIS — Z0000 Encounter for general adult medical examination without abnormal findings: Secondary | ICD-10-CM

## 2021-12-15 DIAGNOSIS — D649 Anemia, unspecified: Secondary | ICD-10-CM | POA: Diagnosis not present

## 2021-12-15 DIAGNOSIS — M1A0620 Idiopathic chronic gout, left knee, without tophus (tophi): Secondary | ICD-10-CM

## 2021-12-15 LAB — HEPATIC FUNCTION PANEL
ALT: 28 U/L (ref 0–53)
AST: 20 U/L (ref 0–37)
Albumin: 4.7 g/dL (ref 3.5–5.2)
Alkaline Phosphatase: 70 U/L (ref 39–117)
Bilirubin, Direct: 0.2 mg/dL (ref 0.0–0.3)
Total Bilirubin: 0.9 mg/dL (ref 0.2–1.2)
Total Protein: 7.2 g/dL (ref 6.0–8.3)

## 2021-12-15 LAB — BASIC METABOLIC PANEL
BUN: 11 mg/dL (ref 6–23)
CO2: 27 mEq/L (ref 19–32)
Calcium: 9 mg/dL (ref 8.4–10.5)
Chloride: 103 mEq/L (ref 96–112)
Creatinine, Ser: 0.95 mg/dL (ref 0.40–1.50)
GFR: 87.41 mL/min (ref >60.00)
Glucose, Bld: 97 mg/dL (ref 70–99)
Potassium: 4 mEq/L (ref 3.5–5.1)
Sodium: 140 mEq/L (ref 135–145)

## 2021-12-15 LAB — HEMOGLOBIN A1C: Hgb A1c MFr Bld: 6 % (ref 4.6–6.5)

## 2021-12-15 LAB — LIPID PANEL
Cholesterol: 159 mg/dL (ref 0–200)
HDL: 51.4 mg/dL (ref >39.00)
LDL Cholesterol: 94 mg/dL (ref 0–99)
NonHDL: 107.26
Total CHOL/HDL Ratio: 3
Triglycerides: 67 mg/dL (ref 0.0–149.0)
VLDL: 13.4 mg/dL (ref 0.0–40.0)

## 2021-12-15 LAB — TSH: TSH: 1.51 u[IU]/mL (ref 0.35–5.50)

## 2021-12-15 LAB — PSA: PSA: 1.83 ng/mL (ref 0.10–4.00)

## 2021-12-15 NOTE — Progress Notes (Signed)
Subjective:    Patient ID: Dale [name], male    DOB: [birth year], 61 y.o.   MRN: [id number]  HPI Here for a well exam. He has no concerns. His gout is well controlled and he sees Dr. Lorin Mercy for this. His recent uric acid level was normal. Dr. Lorin Mercy gave him a steroid injection in the left knee recently, and this has been helpful. He sees the [facility] for testosterone treatment.    Review of Systems  Constitutional: Negative.   HENT: Negative.    Eyes: Negative.   Respiratory: Negative.    Cardiovascular: Negative.   Gastrointestinal: Negative.   Genitourinary: Negative.   Musculoskeletal:  Positive for arthralgias.  Skin: Negative.   Neurological: Negative.   Psychiatric/Behavioral: Negative.         Objective:   Physical Exam Constitutional:      General: He is not in acute distress.    Appearance: Normal appearance. He is well-developed. He is not diaphoretic.  HENT:     Head: Normocephalic and atraumatic.     Right Ear: External ear normal.     Left Ear: External ear normal.     Nose: Nose normal.     Mouth/Throat:     Pharynx: No oropharyngeal exudate.  Eyes:     General: No scleral icterus.       Right eye: No discharge.        Left eye: No discharge.     Conjunctiva/sclera: Conjunctivae normal.     Pupils: Pupils are equal, round, and reactive to light.  Neck:     Thyroid: No thyromegaly.     Vascular: No JVD.     Trachea: No tracheal deviation.  Cardiovascular:     Rate and Rhythm: Normal rate and regular rhythm.     Heart sounds: Normal heart sounds. No murmur heard.    No friction rub. No gallop.  Pulmonary:     Effort: Pulmonary effort is normal. No respiratory distress.     Breath sounds: Normal breath sounds. No wheezing or rales.  Chest:     Chest wall: No tenderness.  Abdominal:     General: Bowel sounds are normal. There is no distension.     Palpations: Abdomen is soft. There is no mass.     Tenderness: There is no abdominal  tenderness. There is no guarding or rebound.  Genitourinary:    Penis: Normal. No tenderness.      Testes: Normal.     Prostate: Normal.     Rectum: Normal. Guaiac result negative.  Musculoskeletal:        General: No tenderness. Normal range of motion.     Cervical back: Neck supple.  Lymphadenopathy:     Cervical: No cervical adenopathy.  Skin:    General: Skin is warm and dry.     Coloration: Skin is not pale.     Findings: No erythema or rash.  Neurological:     Mental Status: He is alert and oriented to person, place, and time.     Cranial Nerves: No cranial nerve deficit.     Motor: No abnormal muscle tone.     Coordination: Coordination normal.     Deep Tendon Reflexes: Reflexes are normal and symmetric. Reflexes normal.  Psychiatric:        Behavior: Behavior normal.        Thought Content: Thought content normal.        Judgment: Judgment normal.  Assessment & Plan:  Well exam. We discussed diet and exercise. Get fasting labs. Alysia Penna, MD

## 2021-12-16 LAB — CBC WITH DIFFERENTIAL/PLATELET
Absolute Monocytes: 998 cells/uL — ABNORMAL HIGH (ref 200–950)
Basophils Absolute: 90 cells/uL (ref 0–200)
Basophils Relative: 1.4 %
Eosinophils Absolute: 179 cells/uL (ref 15–500)
Eosinophils Relative: 2.8 %
HCT: 42.1 % (ref 38.5–50.0)
Hemoglobin: 11.8 g/dL — ABNORMAL LOW (ref 13.2–17.1)
Lymphs Abs: 1677 cells/uL (ref 850–3900)
MCH: 18.5 pg — ABNORMAL LOW (ref 27.0–33.0)
MCHC: 28 g/dL — ABNORMAL LOW (ref 32.0–36.0)
MCV: 66.1 fL — ABNORMAL LOW (ref 80.0–100.0)
Monocytes Relative: 15.6 %
Neutro Abs: 3456 cells/uL (ref 1500–7800)
Neutrophils Relative %: 54 %
Platelets: 257 10*3/uL (ref 140–400)
RBC: 6.37 10*6/uL — ABNORMAL HIGH (ref 4.20–5.80)
RDW: 19.7 % — ABNORMAL HIGH (ref 11.0–15.0)
Total Lymphocyte: 26.2 %
WBC: 6.4 10*3/uL (ref 3.8–10.8)

## 2021-12-17 ENCOUNTER — Other Ambulatory Visit: Payer: Self-pay

## 2021-12-17 DIAGNOSIS — D649 Anemia, unspecified: Secondary | ICD-10-CM

## 2021-12-17 MED ORDER — IRON (FERROUS SULFATE) 325 (65 FE) MG PO TABS: ORAL_TABLET | ORAL | 3 refills | Status: AC

## 2021-12-17 NOTE — Addendum Note (Signed)
Addended by: Alysia Penna A on: 12/17/2021 02:13 PM   Modules accepted: Orders

## 2021-12-18 NOTE — Progress Notes (Signed)
Noted  

## 2021-12-19 ENCOUNTER — Telehealth: Payer: Self-pay | Admitting: Family Medicine

## 2021-12-19 NOTE — Telephone Encounter (Signed)
Pt was wondering if he has the correct stool kit; he received it this week from the nurse and the stool kit is Hemoccult Sensa. He said if it is the right one then can a nurse call him to walk him through it.   #[phone]  Please advise.

## 2021-12-19 NOTE — Telephone Encounter (Signed)
Spoke with pt advised to pick up another Hemoccult kit from the office. Pt scheduled for appointment on 12/23/21  to discuss about his iron levels

## 2021-12-23 ENCOUNTER — Encounter: Payer: Self-pay | Admitting: Family Medicine

## 2021-12-23 ENCOUNTER — Ambulatory Visit: Payer: 59 | Admitting: Family Medicine

## 2021-12-23 VITALS — BP 132/80 | HR 89 | Temp 98.5°F | Wt 199.0 lb

## 2021-12-23 DIAGNOSIS — D649 Anemia, unspecified: Secondary | ICD-10-CM

## 2021-12-23 LAB — CBC WITH DIFFERENTIAL/PLATELET
Basophils Absolute: 0.1 10*3/uL (ref 0.0–0.1)
Basophils Relative: 1.3 % (ref 0.0–3.0)
Eosinophils Absolute: 0.1 10*3/uL (ref 0.0–0.7)
Eosinophils Relative: 2.3 % (ref 0.0–5.0)
HCT: 39.2 % (ref 39.0–52.0)
Hemoglobin: 11.7 g/dL — ABNORMAL LOW (ref 13.0–17.0)
Lymphocytes Relative: 21.7 % (ref 12.0–46.0)
Lymphs Abs: 1.4 10*3/uL (ref 0.7–4.0)
MCHC: 29.9 g/dL — ABNORMAL LOW (ref 30.0–36.0)
MCV: 63.1 fl — ABNORMAL LOW (ref 78.0–100.0)
Monocytes Absolute: 1 10*3/uL (ref 0.1–1.0)
Monocytes Relative: 16.7 % — ABNORMAL HIGH (ref 3.0–12.0)
Neutro Abs: 3.6 10*3/uL (ref 1.4–7.7)
Neutrophils Relative %: 58 % (ref 43.0–77.0)
Platelets: 245 10*3/uL (ref 150.0–400.0)
RBC: 6.23 Mil/uL — ABNORMAL HIGH (ref 4.22–5.81)
RDW: 20.4 % — ABNORMAL HIGH (ref 11.5–15.5)
WBC: 6.3 10*3/uL (ref 4.0–10.5)

## 2021-12-23 LAB — VITAMIN B12: Vitamin B-12: 943 pg/mL — ABNORMAL HIGH (ref 211–911)

## 2021-12-23 LAB — IBC + FERRITIN
Ferritin: 6.1 ng/mL — ABNORMAL LOW (ref 22.0–322.0)
Iron: 117 ug/dL (ref 42–165)
Saturation Ratios: 23.1 % (ref 20.0–50.0)
TIBC: 506.8 ug/dL — ABNORMAL HIGH (ref 250.0–450.0)
Transferrin: 362 mg/dL — ABNORMAL HIGH (ref 212.0–360.0)

## 2021-12-23 LAB — FOLATE: Folate: 16.4 ng/mL (ref >5.9)

## 2021-12-23 NOTE — Progress Notes (Signed)
   Subjective:    Patient ID: Dale [name], male    DOB: [birth year], 61 y.o.   MRN: [id number]  HPI Here to discuss his anemia. When we did labs for his physical on 12-15-21 his Hgb was 11.8 with an MCV of 66.1. This is most likely due to an iron deficiency so we started him on an iron supplement. He was alarmed by this and wants to discuss it. Of note his Hgb in 2015 on three different occasions was in the range of 11.1 to 11.7. Then it jumped up to 16.3 in 2015 after he was started on testosterone treatments. This was 17.0 as recently as 2021. However earlier this year the testosterone treatments were stopped. In general he feels fine. He is not a vegetarian.    Review of Systems  Constitutional: Negative.   Respiratory: Negative.    Cardiovascular: Negative.        Objective:   Physical Exam Constitutional:      Appearance: Normal appearance.  Cardiovascular:     Rate and Rhythm: Normal rate and regular rhythm.     Pulses: Normal pulses.     Heart sounds: Normal heart sounds.  Pulmonary:     Effort: Pulmonary effort is normal.     Breath sounds: Normal breath sounds.  Neurological:     Mental Status: He is alert.           Assessment & Plan:  He has mild anemia which is most likely due to iron deficiency. I reassured him that I do not think that something has suddenly developed. Rather his Hgb was artificially elevated when he was on testosterone treatments, and the Hgb has now gone back to his baseline since he stopped them. We will evaluate further today by checking iron, ferritin, B12, and folate. Alysia Penna, MD

## 2022-01-20 ENCOUNTER — Other Ambulatory Visit: Payer: Self-pay | Admitting: Family Medicine

## 2022-01-20 DIAGNOSIS — M1A0620 Idiopathic chronic gout, left knee, without tophus (tophi): Secondary | ICD-10-CM

## 2022-01-27 ENCOUNTER — Other Ambulatory Visit (INDEPENDENT_AMBULATORY_CARE_PROVIDER_SITE_OTHER): Payer: 59

## 2022-01-27 DIAGNOSIS — D649 Anemia, unspecified: Secondary | ICD-10-CM | POA: Diagnosis not present

## 2022-01-27 LAB — HEMOCCULT SLIDES (X 3 CARDS)
Fecal Occult Blood: NEGATIVE
OCCULT 1: NEGATIVE
OCCULT 2: NEGATIVE
OCCULT 3: NEGATIVE
OCCULT 4: NEGATIVE
OCCULT 5: NEGATIVE

## 2022-02-12 ENCOUNTER — Other Ambulatory Visit: Payer: Self-pay | Admitting: Family Medicine

## 2022-03-16 ENCOUNTER — Other Ambulatory Visit: Payer: Self-pay | Admitting: Family Medicine

## 2022-03-17 NOTE — Telephone Encounter (Signed)
Pt LOV was on 12/23/2021 Last refill was  done on 08/29/21 Please advise

## 2022-03-18 ENCOUNTER — Encounter: Payer: Self-pay | Admitting: Family Medicine

## 2022-03-18 ENCOUNTER — Ambulatory Visit: Payer: 59 | Admitting: Family Medicine

## 2022-03-18 VITALS — BP 130/82 | HR 67 | Temp 98.6°F | Wt 203.0 lb

## 2022-03-18 DIAGNOSIS — D509 Iron deficiency anemia, unspecified: Secondary | ICD-10-CM | POA: Insufficient documentation

## 2022-03-18 DIAGNOSIS — J019 Acute sinusitis, unspecified: Secondary | ICD-10-CM

## 2022-03-18 MED ORDER — METHYLPREDNISOLONE ACETATE 80 MG/ML IJ SUSP
80.0000 mg | Freq: Once | INTRAMUSCULAR | Status: AC
  Administered 2022-03-18: 80 mg via INTRAMUSCULAR

## 2022-03-18 MED ORDER — DOXYCYCLINE HYCLATE 100 MG PO CAPS: 100.0000 mg | ORAL_CAPSULE | Freq: Two times a day (BID) | ORAL | 0 refills | Status: AC

## 2022-03-18 MED ORDER — METHYLPREDNISOLONE ACETATE 40 MG/ML IJ SUSP
40.0000 mg | Freq: Once | INTRAMUSCULAR | Status: AC
  Administered 2022-03-18: 40 mg via INTRAMUSCULAR

## 2022-03-18 NOTE — Addendum Note (Signed)
Addended by: Wyvonne Lenz on: 03/18/2022 04:39 PM   Modules accepted: Orders

## 2022-03-18 NOTE — Progress Notes (Signed)
   Subjective:    Patient ID: Dale [name], male    DOB: [birth year], 62 y.o.   MRN: [id number]  HPI Here for several issues. First he has had a stuffy head, PND, and a dry cough for 4 weeks. No fever or ST or SOB. Also he has been taking an iron supplement since December for iron deficiency anemia. On 12-23-21 his iron was normal at 117, but his Hgb was low at 11.7 and his ferritin was low at 6.1.    Review of Systems  Constitutional: Negative.   HENT:  Positive for congestion, ear pain, postnasal drip and sinus pressure. Negative for sore throat.   Eyes: Negative.   Respiratory:  Positive for cough. Negative for shortness of breath and wheezing.        Objective:   Physical Exam Constitutional:      Appearance: Normal appearance. He is not ill-appearing.  HENT:     Right Ear: Tympanic membrane, ear canal and external ear normal.     Left Ear: Tympanic membrane, ear canal and external ear normal.     Nose: Nose normal.     Mouth/Throat:     Pharynx: Oropharynx is clear.  Eyes:     Conjunctiva/sclera: Conjunctivae normal.  Pulmonary:     Effort: Pulmonary effort is normal.     Breath sounds: Normal breath sounds.  Lymphadenopathy:     Cervical: No cervical adenopathy.  Neurological:     Mental Status: He is alert.           Assessment & Plan:  Sinusitis, treat with 10 days of Doxycycline and a shot of DepoMedrol. We will get labs today to check on the anemia.  Alysia Penna, MD

## 2022-03-19 LAB — IBC + FERRITIN
Ferritin: 9.6 ng/mL — ABNORMAL LOW (ref 22.0–322.0)
Iron: 27 ug/dL — ABNORMAL LOW (ref 42–165)
Saturation Ratios: 5.2 % — ABNORMAL LOW (ref 20.0–50.0)
TIBC: 515.2 ug/dL — ABNORMAL HIGH (ref 250.0–450.0)
Transferrin: 368 mg/dL — ABNORMAL HIGH (ref 212.0–360.0)

## 2022-03-19 LAB — CBC WITH DIFFERENTIAL/PLATELET
Basophils Absolute: 0.1 10*3/uL (ref 0.0–0.1)
Basophils Relative: 0.9 % (ref 0.0–3.0)
Eosinophils Absolute: 0.1 10*3/uL (ref 0.0–0.7)
Eosinophils Relative: 1.7 % (ref 0.0–5.0)
HCT: 50.8 % (ref 39.0–52.0)
Hemoglobin: 15.5 g/dL (ref 13.0–17.0)
Lymphocytes Relative: 25.7 % (ref 12.0–46.0)
Lymphs Abs: 1.7 10*3/uL (ref 0.7–4.0)
MCHC: 30.5 g/dL (ref 30.0–36.0)
MCV: 71.8 fl — ABNORMAL LOW (ref 78.0–100.0)
Monocytes Absolute: 1.1 10*3/uL — ABNORMAL HIGH (ref 0.1–1.0)
Monocytes Relative: 17.6 % — ABNORMAL HIGH (ref 3.0–12.0)
Neutro Abs: 3.5 10*3/uL (ref 1.4–7.7)
Neutrophils Relative %: 54.1 % (ref 43.0–77.0)
Platelets: 189 10*3/uL (ref 150.0–400.0)
RBC: 7.08 Mil/uL — ABNORMAL HIGH (ref 4.22–5.81)
RDW: 23.1 % — ABNORMAL HIGH (ref 11.5–15.5)
WBC: 6.4 10*3/uL (ref 4.0–10.5)

## 2022-03-20 NOTE — Addendum Note (Signed)
Addended by: Alysia Penna A on: 03/20/2022 07:59 AM   Modules accepted: Orders

## 2022-03-21 ENCOUNTER — Other Ambulatory Visit: Payer: Self-pay | Admitting: Family Medicine

## 2022-03-31 ENCOUNTER — Inpatient Hospital Stay: Payer: 59 | Admitting: Family

## 2022-03-31 ENCOUNTER — Other Ambulatory Visit: Payer: Self-pay | Admitting: Family

## 2022-03-31 ENCOUNTER — Inpatient Hospital Stay: Payer: 59 | Attending: Hematology & Oncology

## 2022-03-31 ENCOUNTER — Encounter: Payer: Self-pay | Admitting: Family

## 2022-03-31 VITALS — BP 157/96 | HR 116 | Temp 99.3°F | Resp 17 | Ht 69.5 in | Wt 205.0 lb

## 2022-03-31 DIAGNOSIS — K2101 Gastro-esophageal reflux disease with esophagitis, with bleeding: Secondary | ICD-10-CM

## 2022-03-31 DIAGNOSIS — K5730 Diverticulosis of large intestine without perforation or abscess without bleeding: Secondary | ICD-10-CM | POA: Insufficient documentation

## 2022-03-31 DIAGNOSIS — D649 Anemia, unspecified: Secondary | ICD-10-CM

## 2022-03-31 DIAGNOSIS — Z801 Family history of malignant neoplasm of trachea, bronchus and lung: Secondary | ICD-10-CM

## 2022-03-31 DIAGNOSIS — Z79899 Other long term (current) drug therapy: Secondary | ICD-10-CM | POA: Diagnosis not present

## 2022-03-31 DIAGNOSIS — D508 Other iron deficiency anemias: Secondary | ICD-10-CM | POA: Diagnosis not present

## 2022-03-31 DIAGNOSIS — D509 Iron deficiency anemia, unspecified: Secondary | ICD-10-CM

## 2022-03-31 DIAGNOSIS — Z85828 Personal history of other malignant neoplasm of skin: Secondary | ICD-10-CM | POA: Diagnosis not present

## 2022-03-31 LAB — CBC WITH DIFFERENTIAL (CANCER CENTER ONLY)
Abs Immature Granulocytes: 0.06 10*3/uL (ref 0.00–0.07)
Basophils Absolute: 0.1 10*3/uL (ref 0.0–0.1)
Basophils Relative: 1 %
Eosinophils Absolute: 0.1 10*3/uL (ref 0.0–0.5)
Eosinophils Relative: 1 %
HCT: 49.5 % (ref 39.0–52.0)
Hemoglobin: 14.8 g/dL (ref 13.0–17.0)
Immature Granulocytes: 1 %
Lymphocytes Relative: 13 %
Lymphs Abs: 1.6 10*3/uL (ref 0.7–4.0)
MCH: 22.2 pg — ABNORMAL LOW (ref 26.0–34.0)
MCHC: 29.9 g/dL — ABNORMAL LOW (ref 30.0–36.0)
MCV: 74.1 fL — ABNORMAL LOW (ref 80.0–100.0)
Monocytes Absolute: 1.7 10*3/uL — ABNORMAL HIGH (ref 0.1–1.0)
Monocytes Relative: 14 %
Neutro Abs: 9.1 10*3/uL — ABNORMAL HIGH (ref 1.7–7.7)
Neutrophils Relative %: 70 %
Platelet Count: 187 10*3/uL (ref 150–400)
RBC: 6.68 MIL/uL — ABNORMAL HIGH (ref 4.22–5.81)
RDW: 21.9 % — ABNORMAL HIGH (ref 11.5–15.5)
WBC Count: 12.6 10*3/uL — ABNORMAL HIGH (ref 4.0–10.5)
nRBC: 0 % (ref 0.0–0.2)

## 2022-03-31 LAB — CMP (CANCER CENTER ONLY)
ALT: 27 U/L (ref 0–44)
AST: 32 U/L (ref 15–41)
Albumin: 4.2 g/dL (ref 3.5–5.0)
Alkaline Phosphatase: 71 U/L (ref 38–126)
Anion gap: 9 (ref 5–15)
BUN: 15 mg/dL (ref 6–20)
CO2: 23 mmol/L (ref 22–32)
Calcium: 8.5 mg/dL — ABNORMAL LOW (ref 8.9–10.3)
Chloride: 99 mmol/L (ref 98–111)
Creatinine: 0.98 mg/dL (ref 0.61–1.24)
GFR, Estimated: >60 mL/min (ref >60)
Glucose, Bld: 104 mg/dL — ABNORMAL HIGH (ref 70–99)
Potassium: 3.6 mmol/L (ref 3.5–5.1)
Sodium: 131 mmol/L — ABNORMAL LOW (ref 135–145)
Total Bilirubin: 1.5 mg/dL — ABNORMAL HIGH (ref 0.3–1.2)
Total Protein: 7.8 g/dL (ref 6.5–8.1)

## 2022-03-31 LAB — RETICULOCYTES
Immature Retic Fract: 23.2 % — ABNORMAL HIGH (ref 2.3–15.9)
RBC.: 6.64 MIL/uL — ABNORMAL HIGH (ref 4.22–5.81)
Retic Count, Absolute: 102.3 10*3/uL (ref 19.0–186.0)
Retic Ct Pct: 1.5 % (ref 0.4–3.1)

## 2022-03-31 LAB — FERRITIN: Ferritin: 17 ng/mL — ABNORMAL LOW (ref 24–336)

## 2022-03-31 LAB — LACTATE DEHYDROGENASE: LDH: 169 U/L (ref 98–192)
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
## 2022-04-01 ENCOUNTER — Ambulatory Visit (INDEPENDENT_AMBULATORY_CARE_PROVIDER_SITE_OTHER): Payer: 59

## 2022-04-01 ENCOUNTER — Encounter: Payer: Self-pay | Admitting: Family

## 2022-04-01 ENCOUNTER — Ambulatory Visit: Payer: 59 | Admitting: Physician Assistant

## 2022-04-01 ENCOUNTER — Encounter: Payer: Self-pay | Admitting: Physician Assistant

## 2022-04-01 DIAGNOSIS — R0781 Pleurodynia: Secondary | ICD-10-CM

## 2022-04-01 LAB — IRON AND IRON BINDING CAPACITY (CC-WL,HP ONLY)
Iron: 23 ug/dL — ABNORMAL LOW (ref 45–182)
Saturation Ratios: 5 % — ABNORMAL LOW (ref 17.9–39.5)
TIBC: 459 ug/dL — ABNORMAL HIGH (ref 250–450)
UIBC: 436 ug/dL — ABNORMAL HIGH (ref 117–376)

## 2022-04-01 MED ORDER — HYDROCODONE-ACETAMINOPHEN 5-325 MG PO TABS: 1.0000 | ORAL_TABLET | ORAL | 0 refills | Status: DC | PRN

## 2022-04-01 NOTE — Progress Notes (Signed)
Office Visit Note   Patient: Dale [name]           Date of Birth: 1960-05-30           MRN: [id number] Visit Date: 04/01/2022              Requested by: Laurey Morale, MD [place],  [place] [id number] PCP: Laurey Morale, MD  Chief Complaint  Patient presents with   LEFT SIDED RIB PAIN      HPI: Dale [name] is a pleasant 62 year old patient who is a patient of Dr. Lorin Mercy comes in today complaining of left posterior and anterior rib pain.  He is status post a fall when he missed a step over the weekend.  Denies any shortness of breath any loss of consciousness.  He did not really think too much of it however he noticed when he turns a certain way that he has a pain that wraps around his thoracic spine.  He also noticed the last couple days since the injury it has been very difficult for him to get out of bed.  Assessment & Plan: Visit Diagnoses:  1. Rib pain     Plan: Had a discussion with the patient today I will call him in a few hydrocodone to take.  Stressed the importance of taking deep breaths.  If he has any increased shortness of breath he should be seen and evaluated emergently in the ER.  I did tell him that I will recheck with him next week.  I did review his rib x-rays with Dr. Marlou Sa.  Also discussed with him the nodules continuing in his chest and that he needs proper follow-up as was recommended on a yearly basis he understood  Follow-Up Instructions: 1 week  Ortho Exam  Patient is alert, oriented, no adenopathy, well-dressed, normal affect, normal respiratory effort. Patient appears well comfortable not short of breath.  Is able to take a deep breath with good excursion.  She has contusions over the lower left side of his back.  Minimally tender to palpation.  He is neurovascular intact and is strong grip strength.  Cannot appreciate any bruising around his rib cage or scapula no tenderness around the scapula cannot reproduce scapular or rib pain today  however he notices this when he contracts his abdominal muscles.  No tenderness to palpation over the thoracic or lumbar spine  Imaging: XR Ribs Unilateral Left  Result Date: 04/01/2022 Radiographs of his ribs were reviewed with Dr. Marlou Sa today.  Cannot appreciate any acute fractures he does have some nodule mass in the left lung.  This coordinates with previous CT scan  XR Lumbar Spine 2-3 Views  Result Date: 04/01/2022 Radiographs of the lumbar spine demonstrate findings consistent with previous L4-5 fusion hardware intact and in place no acute injuries noted  XR Thoracic Spine 2 View  Result Date: 04/01/2022 Radiographs of the thoracic spine were obtained today well-maintained alignment.  Question small compression injury to the body some degenerative changes no acute changes  No images are attached to the encounter.  Labs: Lab Results  Component Value Date   HGBA1C 6.0 12/15/2021   ESRSEDRATE 2 02/09/2020   LABURIC 3.3 (L) 12/03/2021   LABURIC 5.4 02/09/2020   REPTSTATUS 07/22/2013 FINAL 07/16/2013   GRAMSTAIN No WBC Seen 07/09/2015   GRAMSTAIN No Squamous Epithelial Cells Seen 07/09/2015   GRAMSTAIN No Organisms Seen 07/09/2015   CULT  07/16/2013    NO GROWTH 5 DAYS Performed at  [facility]   LABORGA NO GROWTH 2 DAYS 07/09/2015     Lab Results  Component Value Date   ALBUMIN 4.2 03/31/2022   ALBUMIN 4.7 12/15/2021   ALBUMIN 4.3 02/25/2016    No results found for: "MG" No results found for: "VD25OH"  No results found for: "PREALBUMIN"    Latest Ref Rng & Units 03/31/2022    2:53 PM 03/18/2022    4:08 PM 12/23/2021   11:56 AM  CBC EXTENDED  WBC 4.0 - 10.5 K/uL 12.6  6.4  6.3   RBC 4.22 - 5.81 MIL/uL 4.22 - 5.81 MIL/uL 6.64    6.68  7.08  6.23   Hemoglobin 13.0 - 17.0 g/dL 14.8  15.5  11.7   HCT 39.0 - 52.0 % 49.5  50.8  39.2   Platelets 150 - 400 K/uL 187  189.0  245.0   NEUT# 1.7 - 7.7 K/uL 9.1  3.5  3.6   Lymph# 0.7 - 4.0 K/uL 1.6  1.7  1.4       There is no height or weight on file to calculate BMI.  Orders:  Orders Placed This Encounter  Procedures   XR Ribs Unilateral Left   XR Thoracic Spine 2 View   XR Lumbar Spine 2-3 Views   Meds ordered this encounter  Medications   HYDROcodone-acetaminophen (NORCO/VICODIN) 5-325 MG tablet    Sig: Take 1 tablet by mouth every 4 (four) hours as needed for moderate pain.    Dispense:  20 tablet    Refill:  0     Procedures: No procedures performed  Clinical Data: No additional findings.  ROS:  All other systems negative, except as noted in the HPI. Review of Systems  Objective: Vital Signs: There were no vitals taken for this visit.  Specialty Comments:  No specialty comments available.  PMFS History: Patient Active Problem List   Diagnosis Date Noted   Iron deficiency anemia 03/18/2022   OSA (obstructive sleep apnea) 06/09/2021   Daytime sleepiness 05/12/2021   Idiopathic chronic gout, unspecified site, without tophus (tophi) 02/14/2020   Synovitis of left knee 02/09/2020   S/P cervical spinal fusion 01/03/2020   GERD (gastroesophageal reflux disease) 03/01/2017   Right Achilles tendinitis 12/21/2016   HTN (hypertension) 03/03/2016   OA (osteoarthritis) of knee 07/10/2013   Genital herpes 05/02/2013   CHEST PAIN 08/19/2009   LOW BACK PAIN 06/03/2009   ERECTILE DYSFUNCTION 06/07/2008   HYPERLIPIDEMIA 04/03/2008   DEPRESSION 04/03/2008   Allergic rhinitis 04/03/2008   INSOMNIA 04/03/2008   NEPHROLITHIASIS, HX OF 04/03/2008   Past Medical History:  Diagnosis Date   Allergic rhinitis, seasonal    Anxiety    Arthritis    back   Depression    Genital herpes    GERD (gastroesophageal reflux disease)    Gross hematuria    Hiatal hernia    History of chest pain    s/p  nuclear stress test, 08-26-2009, normal perfusion without ischemia , ef 58%, stated non-cardiac chest pain   History of kidney stones    History of nonmelanoma skin cancer     excision left leg SCC;  excision of chest BCC    Hyperlipidemia    Hypertension    followed by pcp    Nocturia    Urethral lesion    prostatic   Wears glasses     Family History  Problem Relation Age of Onset   Heart attack Father    Coronary artery disease Father  Heart disease Father    Depression Other        family hx   Hyperlipidemia Other        family hx   Hypertension Other        family hx   Lung cancer Other        family hx   Stroke Other        family hx   Hypertension Mother    Hyperlipidemia Mother    Colon cancer Neg Hx    Esophageal cancer Neg Hx    Rectal cancer Neg Hx    Stomach cancer Neg Hx    Colon polyps Neg Hx     Past Surgical History:  Procedure Laterality Date   COLONOSCOPY Bilateral 07/01/2018   per Dr. Hilarie Fredrickson, adenomatous polyp, repeat in 7 yrs   CYSTOSCOPY N/A 08/23/2019   Procedure: CYSTOSCOPY WITH TRANSURETHRAL BIOPSY;  Surgeon: Ceasar Mons, MD;  Location: [facility];  Service: Urology;  Laterality: N/A;   ESOPHAGOGASTRODUODENOSCOPY  05/28/2017   KNEE ARTHROSCOPY Right 01/09/2013   Procedure: ARTHROSCOPY KNEE;  Surgeon: Marybelle Killings, MD;  Location: [place];  Service: Orthopedics;  Laterality: Right;  Right knee scope, partial medial menisectomy   KNEE ARTHROSCOPY Left 2007   KNEE SURGERY Right 1983;  1989;  1990; 2001; last one @ [facility] 06-10-2000   LUMBAR DISC SURGERY  2018   POSTERIOR LUMBAR FUSION  12-21-2011  '@[facility]'$    L4--5   ROTATOR CUFF REPAIR Left 2003   TOTAL KNEE ARTHROPLASTY Right 07/10/2013   Procedure: RIGHT TOTAL KNEE ARTHROPLASTY;  Surgeon: Gearlean Alf, MD;  Location: [facility];  Service: Orthopedics;  Laterality: Right;   VASECTOMY     Social History   Occupational History   Not on file  Tobacco Use   Smoking status: Never   Smokeless tobacco: Never  Vaping Use   Vaping Use: Never used  Substance and Sexual Activity   Alcohol use: Yes    Comment: occasional   Drug use: Never   Sexual  activity: Yes    Comment: VASECTOMY

## 2022-04-02 LAB — ERYTHROPOIETIN: Erythropoietin: 59.2 m[IU]/mL — ABNORMAL HIGH (ref 2.6–18.5)

## 2022-04-03 ENCOUNTER — Inpatient Hospital Stay: Payer: 59

## 2022-04-03 VITALS — BP 138/74 | HR 89 | Temp 98.6°F | Resp 18

## 2022-04-03 DIAGNOSIS — D509 Iron deficiency anemia, unspecified: Secondary | ICD-10-CM

## 2022-04-03 DIAGNOSIS — D508 Other iron deficiency anemias: Secondary | ICD-10-CM | POA: Diagnosis not present

## 2022-04-03 MED ORDER — SODIUM CHLORIDE 0.9 % IV SOLN
300.0000 mg | Freq: Once | INTRAVENOUS | Status: AC
  Administered 2022-04-03: 300 mg via INTRAVENOUS
  Filled 2022-04-03: qty 300

## 2022-04-03 MED ORDER — SODIUM CHLORIDE 0.9 % IV SOLN: Freq: Once | INTRAVENOUS | Status: AC

## 2022-04-03 NOTE — Patient Instructions (Signed)

## 2022-04-10 ENCOUNTER — Inpatient Hospital Stay: Payer: 59

## 2022-04-10 VITALS — BP 158/78 | HR 91 | Temp 97.5°F | Resp 16

## 2022-04-10 DIAGNOSIS — D509 Iron deficiency anemia, unspecified: Secondary | ICD-10-CM

## 2022-04-10 DIAGNOSIS — D508 Other iron deficiency anemias: Secondary | ICD-10-CM | POA: Diagnosis not present

## 2022-04-10 MED ORDER — SODIUM CHLORIDE 0.9 % IV SOLN
300.0000 mg | Freq: Once | INTRAVENOUS | Status: AC
  Administered 2022-04-10: 300 mg via INTRAVENOUS
  Filled 2022-04-10: qty 300

## 2022-04-10 MED ORDER — SODIUM CHLORIDE 0.9 % IV SOLN: Freq: Once | INTRAVENOUS | Status: AC

## 2022-04-10 NOTE — Patient Instructions (Signed)

## 2022-04-13 ENCOUNTER — Telehealth: Payer: Self-pay | Admitting: *Deleted

## 2022-04-13 NOTE — Telephone Encounter (Signed)
Called and lvm of new time for infusion on 3/29 and requested call back to confirm.

## 2022-04-17 ENCOUNTER — Inpatient Hospital Stay: Payer: 59

## 2022-04-17 VITALS — BP 142/89 | HR 100 | Temp 98.0°F | Resp 16

## 2022-04-17 DIAGNOSIS — D508 Other iron deficiency anemias: Secondary | ICD-10-CM | POA: Diagnosis not present

## 2022-04-17 DIAGNOSIS — D509 Iron deficiency anemia, unspecified: Secondary | ICD-10-CM

## 2022-04-17 MED ORDER — SODIUM CHLORIDE 0.9 % IV SOLN
300.0000 mg | Freq: Once | INTRAVENOUS | Status: AC
  Administered 2022-04-17: 300 mg via INTRAVENOUS
  Filled 2022-04-17: qty 300

## 2022-04-17 MED ORDER — SODIUM CHLORIDE 0.9 % IV SOLN: Freq: Once | INTRAVENOUS | Status: AC

## 2022-04-17 NOTE — Patient Instructions (Signed)

## 2022-04-20 ENCOUNTER — Other Ambulatory Visit: Payer: Self-pay | Admitting: Family Medicine

## 2022-04-20 DIAGNOSIS — M1A0620 Idiopathic chronic gout, left knee, without tophus (tophi): Secondary | ICD-10-CM

## 2022-05-19 ENCOUNTER — Other Ambulatory Visit: Payer: Self-pay | Admitting: Family Medicine

## 2022-05-19 DIAGNOSIS — N529 Male erectile dysfunction, unspecified: Secondary | ICD-10-CM

## 2022-05-20 ENCOUNTER — Other Ambulatory Visit: Payer: Self-pay | Admitting: Family Medicine

## 2022-05-22 ENCOUNTER — Encounter: Payer: Self-pay | Admitting: Family Medicine

## 2022-05-22 ENCOUNTER — Ambulatory Visit: Payer: 59 | Admitting: Family Medicine

## 2022-05-22 VITALS — BP 136/84 | HR 86 | Temp 98.1°F | Wt 198.0 lb

## 2022-05-22 DIAGNOSIS — N529 Male erectile dysfunction, unspecified: Secondary | ICD-10-CM

## 2022-05-22 DIAGNOSIS — J3089 Other allergic rhinitis: Secondary | ICD-10-CM

## 2022-05-22 DIAGNOSIS — R918 Other nonspecific abnormal finding of lung field: Secondary | ICD-10-CM

## 2022-05-22 MED ORDER — METHYLPREDNISOLONE ACETATE 80 MG/ML IJ SUSP
80.0000 mg | Freq: Once | INTRAMUSCULAR | Status: AC
  Administered 2022-05-22: 80 mg via INTRAMUSCULAR

## 2022-05-22 MED ORDER — METHYLPREDNISOLONE ACETATE 40 MG/ML IJ SUSP
40.0000 mg | Freq: Once | INTRAMUSCULAR | Status: AC
Start: 2022-05-22 — End: 2022-05-22
  Administered 2022-05-22: 40 mg via INTRAMUSCULAR

## 2022-05-22 MED ORDER — SILDENAFIL CITRATE 100 MG PO TABS: ORAL_TABLET | ORAL | 5 refills | Status: DC

## 2022-05-22 NOTE — Progress Notes (Signed)
   Subjective:    Patient ID: Dale [name], male    DOB: 18-Sep-1960, 62 y.o.   MRN: [id number]  HPI Here for an exacerbation of his spring time allergies. He has itchy eyes, runny nose, and a dry cough. No fever. He also asks about repeating a CT scan of his chest. He had a contrasted chest CT on 05-06-20 that showed some subpleural nodules. He continues to smoke a cigar occasionally. No SOB.    Review of Systems  Constitutional: Negative.   HENT:  Positive for congestion, postnasal drip and rhinorrhea. Negative for ear pain and sore throat.   Eyes:  Positive for itching. Negative for discharge and redness.  Respiratory:  Positive for cough. Negative for shortness of breath and wheezing.   Cardiovascular: Negative.        Objective:   Physical Exam Constitutional:      Appearance: Normal appearance.  HENT:     Right Ear: Tympanic membrane, ear canal and external ear normal.     Left Ear: Tympanic membrane, ear canal and external ear normal.     Nose: Nose normal.     Mouth/Throat:     Pharynx: Oropharynx is clear.  Eyes:     Conjunctiva/sclera: Conjunctivae normal.  Cardiovascular:     Rate and Rhythm: Normal rate and regular rhythm.     Pulses: Normal pulses.     Heart sounds: Normal heart sounds.  Pulmonary:     Effort: Pulmonary effort is normal.     Breath sounds: Normal breath sounds.  Lymphadenopathy:     Cervical: No cervical adenopathy.  Neurological:     Mental Status: He is alert.           Assessment & Plan:  For his allergies, he is given a shot of DepoMedrol. For the subpleural lung nodules, we will set up another chest CT. [name], MD

## 2022-05-22 NOTE — Addendum Note (Signed)
Addended by: Carola Rhine on: 05/22/2022 03:01 PM   Modules accepted: Orders

## 2022-06-02 ENCOUNTER — Inpatient Hospital Stay: Payer: 59

## 2022-06-02 ENCOUNTER — Inpatient Hospital Stay: Payer: 59 | Attending: Hematology & Oncology

## 2022-06-02 ENCOUNTER — Encounter: Payer: Self-pay | Admitting: Family

## 2022-06-02 ENCOUNTER — Inpatient Hospital Stay: Payer: 59 | Admitting: Family

## 2022-06-02 VITALS — BP 136/82 | HR 72 | Resp 18

## 2022-06-02 VITALS — BP 144/86 | HR 80 | Temp 98.6°F | Resp 20 | Ht 70.0 in | Wt 200.0 lb

## 2022-06-02 DIAGNOSIS — D751 Secondary polycythemia: Secondary | ICD-10-CM | POA: Diagnosis present

## 2022-06-02 DIAGNOSIS — D509 Iron deficiency anemia, unspecified: Secondary | ICD-10-CM | POA: Diagnosis not present

## 2022-06-02 DIAGNOSIS — D508 Other iron deficiency anemias: Secondary | ICD-10-CM | POA: Insufficient documentation

## 2022-06-02 LAB — CBC WITH DIFFERENTIAL (CANCER CENTER ONLY)
Abs Immature Granulocytes: 0.07 10*3/uL (ref 0.00–0.07)
Basophils Absolute: 0.1 10*3/uL (ref 0.0–0.1)
Basophils Relative: 1 %
Eosinophils Absolute: 0.1 10*3/uL (ref 0.0–0.5)
Eosinophils Relative: 2 %
HCT: 58.4 % — ABNORMAL HIGH (ref 39.0–52.0)
Hemoglobin: 19 g/dL — ABNORMAL HIGH (ref 13.0–17.0)
Immature Granulocytes: 1 %
Lymphocytes Relative: 20 %
Lymphs Abs: 1.5 10*3/uL (ref 0.7–4.0)
MCH: 27.5 pg (ref 26.0–34.0)
MCHC: 32.5 g/dL (ref 30.0–36.0)
MCV: 84.5 fL (ref 80.0–100.0)
Monocytes Absolute: 0.8 10*3/uL (ref 0.1–1.0)
Monocytes Relative: 10 %
Neutro Abs: 5.2 10*3/uL (ref 1.7–7.7)
Neutrophils Relative %: 66 %
Platelet Count: 161 10*3/uL (ref 150–400)
RBC: 6.91 MIL/uL — ABNORMAL HIGH (ref 4.22–5.81)
RDW: 23.9 % — ABNORMAL HIGH (ref 11.5–15.5)
WBC Count: 7.8 10*3/uL (ref 4.0–10.5)
nRBC: 0 % (ref 0.0–0.2)

## 2022-06-02 LAB — FERRITIN: Ferritin: 51 ng/mL (ref 24–336)

## 2022-06-02 LAB — IRON AND IRON BINDING CAPACITY (CC-WL,HP ONLY)
Iron: 110 ug/dL (ref 45–182)
Saturation Ratios: 29 % (ref 17.9–39.5)
TIBC: 374 ug/dL (ref 250–450)
UIBC: 264 ug/dL (ref 117–376)

## 2022-06-02 LAB — RETICULOCYTES
Immature Retic Fract: 11.8 % (ref 2.3–15.9)
RBC.: 6.91 MIL/uL — ABNORMAL HIGH (ref 4.22–5.81)
Retic Count, Absolute: 113.3 10*3/uL (ref 19.0–186.0)
Retic Ct Pct: 1.6 % (ref 0.4–3.1)

## 2022-06-02 NOTE — Progress Notes (Signed)
Hematology and Oncology Follow Up Visit  Dale [name] [id number] 1960-05-02 62 y.o. 06/02/2022   Principle Diagnosis:  Iron deficiency anemia after frequent phlebotomy for elevated Hgb with testosterone use  Current Therapy:   IV iron as indicated   Interim History:  Dale [name] is here today with his wife for follow-up. His Hgb is now 19.0, MCV 84, platelets 161 and WBC count 7.8.  He is currently on testosterone 0.7 ml IM weekly.  He is not currently taking any aspirin.  He has occasional mild SOB with over exertion.  No fever, chills, n/v, cough, rash, dizziness, SOB, chest pain, palpitations, abdominal pain or changes in bowel or bladder habits at this time.  No bleeding, bruising or petechiae.  No swelling, tenderness, numbness or tingling in his extremities.  No falls or syncope.  Appetite and hydration are good. Weight is stable at 200 lbs.   ECOG Performance Status: 0 - Asymptomatic  Medications:  Allergies as of 06/02/2022       Reactions   Lisinopril Cough        Medication List        Accurate as of Jun 02, 2022 10:03 AM. If you have any questions, ask your nurse or doctor.          allopurinol 300 MG tablet Commonly known as: ZYLOPRIM TAKE 1 TABLET DAILY   amLODipine 5 MG tablet Commonly known as: NORVASC TAKE 1 TABLET DAILY   anastrozole 1 MG tablet Commonly known as: ARIMIDEX Take 0.5 mg by mouth once a week.   atorvastatin 10 MG tablet Commonly known as: LIPITOR TAKE 1 TABLET EVERY MORNING   cyanocobalamin 1000 MCG tablet Commonly known as: VITAMIN B12 Take 1,000 mcg by mouth once a week.   GLUCOSAMINE CHONDROITIN TRIPLE PO Take by mouth. With Tumeric   HYDROcodone-acetaminophen 5-325 MG tablet Commonly known as: NORCO/VICODIN Take 1 tablet by mouth every 4 (four) hours as needed for moderate pain.   hydrocortisone cream 1 % Apply 1 application. topically as needed for itching.   Iron (Ferrous Sulfate) 325 (65 Fe) MG Tabs Take 1  tablet my mouth daily.   Jublia 10 % Soln Generic drug: Efinaconazole Apply topically daily.   LORazepam 2 MG tablet Commonly known as: ATIVAN TAKE 1 TABLET BY MOUTH EVERY 6 HOURS AS NEEDED   MAGNESIUM GLYCINATE PO Take by mouth.   omeprazole 20 MG capsule Commonly known as: PRILOSEC Take 20 mg by mouth daily.   ONE-A-DAY MENS PO Take 2 capsules by mouth daily.   oxyCODONE-acetaminophen 5-325 MG tablet Commonly known as: PERCOCET/ROXICET Take 1 tablet by mouth every 4 (four) hours as needed for severe pain.   Pregnyl 40981 units injection Generic drug: human chorionic gonadotropin 0.4 ml ( 400 iu) Sub Q on day 3 and day 5 post testosterone injection   sildenafil 100 MG tablet Commonly known as: VIAGRA TAKE 1 TABLET BY MOUTH DAILY AS NEEDED FOR ERECTILE DYSFUNCTION   testosterone cypionate 100 MG/ML injection Commonly known as: DEPOTESTOTERONE CYPIONATE Inject into the muscle once a week. For IM use only   valACYclovir 500 MG tablet Commonly known as: VALTREX TAKE 1 TABLET DAILY   VITAMIN K2-VITAMIN D3 PO Take by mouth daily.        Allergies:  Allergies  Allergen Reactions   Lisinopril Cough    Past Medical History, Surgical history, Social history, and Family History were reviewed and updated.  Review of Systems: All other 10 point review of systems is negative.  Physical Exam:  vitals were not taken for this visit.   Wt Readings from Last 3 Encounters:  05/22/22 198 lb (89.8 kg)  03/31/22 205 lb (93 kg)  03/18/22 203 lb (92.1 kg)    Ocular: Sclerae unicteric, pupils equal, round and reactive to light Ear-nose-throat: Oropharynx clear, dentition fair Lymphatic: No cervical or supraclavicular adenopathy Lungs no rales or rhonchi, good excursion bilaterally Heart regular rate and rhythm, no murmur appreciated Abd soft, nontender, positive bowel sounds MSK no focal spinal tenderness, no joint edema Neuro: non-focal, well-oriented, appropriate  affect Breasts: Deferred   Lab Results  Component Value Date   WBC 12.6 (H) 03/31/2022   HGB 14.8 03/31/2022   HCT 49.5 03/31/2022   MCV 74.1 (L) 03/31/2022   PLT 187 03/31/2022   Lab Results  Component Value Date   FERRITIN 17 (L) 03/31/2022   IRON 23 (L) 03/31/2022   TIBC 459 (H) 03/31/2022   UIBC 436 (H) 03/31/2022   IRONPCTSAT 5 (L) 03/31/2022   Lab Results  Component Value Date   RETICCTPCT 1.5 03/31/2022   RBC 6.64 (H) 03/31/2022   RBC 6.68 (H) 03/31/2022   No results found for: "KPAFRELGTCHN", "LAMBDASER", "KAPLAMBRATIO" No results found for: "IGGSERUM", "IGA", "IGMSERUM" No results found for: "TOTALPROTELP", "ALBUMINELP", "A1GS", "A2GS", "BETS", "BETA2SER", "GAMS", "MSPIKE", "SPEI"   Chemistry      Component Value Date/Time   NA 131 (L) 03/31/2022 1453   K 3.6 03/31/2022 1453   CL 99 03/31/2022 1453   CO2 23 03/31/2022 1453   BUN 15 03/31/2022 1453   CREATININE 0.98 03/31/2022 1453   CREATININE 0.97 12/03/2021 1139      Component Value Date/Time   CALCIUM 8.5 (L) 03/31/2022 1453   ALKPHOS 71 03/31/2022 1453   AST 32 03/31/2022 1453   ALT 27 03/31/2022 1453   BILITOT 1.5 (H) 03/31/2022 1453       Impression and Plan: Dale [name] is a 62 yo gentleman with history of iron deficiency anemia after agressive phlebotomizing at an outside office as well as likely malabsorption. 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  Hgb after IV iron and with testosterone injection is now hack up to 19.0.  We will have him restart an EC 81 mg aspirin Po daily with dinner.  He will decrease his testosterone to 0.5 ml IM weekly.  Phlebotomy today for high Hgb.     Iron studies are pending.  Lab check in 3 weeks, follow-up in 6 weeks.   Eileen Stanford, NP 5/14/202410:03 AM

## 2022-06-02 NOTE — Progress Notes (Signed)
Dale [name] presents today for phlebotomy per MD orders. Phlebotomy procedure started at 1058 and ended at 1103. 565 cc removed via 16 G needle at R forearm. Patient tolerated procedure well.

## 2022-06-02 NOTE — Patient Instructions (Signed)

## 2022-06-19 ENCOUNTER — Other Ambulatory Visit: Payer: Self-pay | Admitting: Family Medicine

## 2022-06-23 ENCOUNTER — Inpatient Hospital Stay: Payer: 59 | Attending: Hematology & Oncology

## 2022-06-23 DIAGNOSIS — D509 Iron deficiency anemia, unspecified: Secondary | ICD-10-CM | POA: Insufficient documentation

## 2022-06-23 DIAGNOSIS — D751 Secondary polycythemia: Secondary | ICD-10-CM | POA: Insufficient documentation

## 2022-06-23 DIAGNOSIS — Z7982 Long term (current) use of aspirin: Secondary | ICD-10-CM | POA: Insufficient documentation

## 2022-06-23 DIAGNOSIS — Z79890 Hormone replacement therapy (postmenopausal): Secondary | ICD-10-CM | POA: Diagnosis not present

## 2022-06-23 LAB — CBC WITH DIFFERENTIAL (CANCER CENTER ONLY)
Abs Immature Granulocytes: 0.03 10*3/uL (ref 0.00–0.07)
Basophils Absolute: 0.1 10*3/uL (ref 0.0–0.1)
Basophils Relative: 1 %
Eosinophils Absolute: 0.1 10*3/uL (ref 0.0–0.5)
Eosinophils Relative: 2 %
HCT: 57 % — ABNORMAL HIGH (ref 39.0–52.0)
Hemoglobin: 19 g/dL — ABNORMAL HIGH (ref 13.0–17.0)
Immature Granulocytes: 0 %
Lymphocytes Relative: 19 %
Lymphs Abs: 1.3 10*3/uL (ref 0.7–4.0)
MCH: 29.1 pg (ref 26.0–34.0)
MCHC: 33.3 g/dL (ref 30.0–36.0)
MCV: 87.3 fL (ref 80.0–100.0)
Monocytes Absolute: 0.9 10*3/uL (ref 0.1–1.0)
Monocytes Relative: 12 %
Neutro Abs: 4.5 10*3/uL (ref 1.7–7.7)
Neutrophils Relative %: 66 %
Platelet Count: 152 10*3/uL (ref 150–400)
RBC: 6.53 MIL/uL — ABNORMAL HIGH (ref 4.22–5.81)
RDW: 21.1 % — ABNORMAL HIGH (ref 11.5–15.5)
WBC Count: 6.9 10*3/uL (ref 4.0–10.5)
nRBC: 0 % (ref 0.0–0.2)

## 2022-06-23 LAB — CMP (CANCER CENTER ONLY)
ALT: 37 U/L (ref 0–44)
AST: 25 U/L (ref 15–41)
Albumin: 4.7 g/dL (ref 3.5–5.0)
Alkaline Phosphatase: 78 U/L (ref 38–126)
Anion gap: 9 (ref 5–15)
BUN: 15 mg/dL (ref 6–20)
CO2: 26 mmol/L (ref 22–32)
Calcium: 9.5 mg/dL (ref 8.9–10.3)
Chloride: 104 mmol/L (ref 98–111)
Creatinine: 0.95 mg/dL (ref 0.61–1.24)
GFR, Estimated: >60 mL/min (ref >60)
Glucose, Bld: 139 mg/dL — ABNORMAL HIGH (ref 70–99)
Potassium: 4.1 mmol/L (ref 3.5–5.1)
Sodium: 139 mmol/L (ref 135–145)
Total Bilirubin: 2 mg/dL — ABNORMAL HIGH (ref 0.3–1.2)
Total Protein: 7.2 g/dL (ref 6.5–8.1)

## 2022-06-23 LAB — IRON AND IRON BINDING CAPACITY (CC-WL,HP ONLY)
Iron: 251 ug/dL — ABNORMAL HIGH (ref 45–182)
Saturation Ratios: 65 % — ABNORMAL HIGH (ref 17.9–39.5)
TIBC: 386 ug/dL (ref 250–450)
UIBC: 135 ug/dL (ref 117–376)

## 2022-06-23 LAB — FERRITIN: Ferritin: 29 ng/mL (ref 24–336)

## 2022-06-30 ENCOUNTER — Ambulatory Visit
Admission: RE | Admit: 2022-06-30 | Discharge: 2022-06-30 | Disposition: A | Discharge status: Home / Self Care | Payer: 59 | Source: Ambulatory Visit | Attending: Family Medicine | Admitting: Family Medicine

## 2022-06-30 DIAGNOSIS — R918 Other nonspecific abnormal finding of lung field: Secondary | ICD-10-CM

## 2022-06-30 MED ORDER — IOPAMIDOL (ISOVUE-370) INJECTION 76%
75.0000 mL | Freq: Once | INTRAVENOUS | Status: AC | PRN
  Administered 2022-06-30: 75 mL via INTRAVENOUS

## 2022-07-09 ENCOUNTER — Telehealth: Payer: Self-pay | Admitting: Family Medicine

## 2022-07-09 DIAGNOSIS — I2510 Atherosclerotic heart disease of native coronary artery without angina pectoris: Secondary | ICD-10-CM

## 2022-07-09 NOTE — Telephone Encounter (Signed)
-----   Message from Sherrin Daisy, [place] sent at 07/07/2022  5:03 PM EDT ----- Pt also stated that someone mention calcification and heart issues. Pt wants to know if he should see Cardio. Please advise

## 2022-07-09 NOTE — Telephone Encounter (Signed)
Done

## 2022-07-13 ENCOUNTER — Other Ambulatory Visit: Payer: Self-pay

## 2022-07-13 ENCOUNTER — Inpatient Hospital Stay: Payer: 59 | Admitting: Medical Oncology

## 2022-07-13 ENCOUNTER — Inpatient Hospital Stay: Payer: 59

## 2022-07-13 ENCOUNTER — Encounter: Payer: Self-pay | Admitting: Medical Oncology

## 2022-07-13 VITALS — BP 136/97 | HR 110 | Temp 98.4°F | Resp 19 | Ht 70.0 in | Wt 201.0 lb

## 2022-07-13 VITALS — BP 137/82 | HR 105 | Resp 20

## 2022-07-13 DIAGNOSIS — D509 Iron deficiency anemia, unspecified: Secondary | ICD-10-CM | POA: Diagnosis not present

## 2022-07-13 DIAGNOSIS — D751 Secondary polycythemia: Secondary | ICD-10-CM

## 2022-07-13 LAB — CBC WITH DIFFERENTIAL (CANCER CENTER ONLY)
Abs Immature Granulocytes: 0.03 10*3/uL (ref 0.00–0.07)
Basophils Absolute: 0.1 10*3/uL (ref 0.0–0.1)
Basophils Relative: 1 %
Eosinophils Absolute: 0.1 10*3/uL (ref 0.0–0.5)
Eosinophils Relative: 2 %
HCT: 57.6 % — ABNORMAL HIGH (ref 39.0–52.0)
Hemoglobin: 19.4 g/dL — ABNORMAL HIGH (ref 13.0–17.0)
Immature Granulocytes: 1 %
Lymphocytes Relative: 20 %
Lymphs Abs: 1.3 10*3/uL (ref 0.7–4.0)
MCH: 30.1 pg (ref 26.0–34.0)
MCHC: 33.7 g/dL (ref 30.0–36.0)
MCV: 89.4 fL (ref 80.0–100.0)
Monocytes Absolute: 0.7 10*3/uL (ref 0.1–1.0)
Monocytes Relative: 10 %
Neutro Abs: 4.3 10*3/uL (ref 1.7–7.7)
Neutrophils Relative %: 66 %
Platelet Count: 160 10*3/uL (ref 150–400)
RBC: 6.44 MIL/uL — ABNORMAL HIGH (ref 4.22–5.81)
RDW: 17.2 % — ABNORMAL HIGH (ref 11.5–15.5)
WBC Count: 6.5 10*3/uL (ref 4.0–10.5)
nRBC: 0 % (ref 0.0–0.2)

## 2022-07-13 LAB — CMP (CANCER CENTER ONLY)
ALT: 58 U/L — ABNORMAL HIGH (ref 0–44)
AST: 42 U/L — ABNORMAL HIGH (ref 15–41)
Albumin: 4.7 g/dL (ref 3.5–5.0)
Alkaline Phosphatase: 83 U/L (ref 38–126)
Anion gap: 9 (ref 5–15)
BUN: 14 mg/dL (ref 6–20)
CO2: 24 mmol/L (ref 22–32)
Calcium: 9.7 mg/dL (ref 8.9–10.3)
Chloride: 107 mmol/L (ref 98–111)
Creatinine: 0.95 mg/dL (ref 0.61–1.24)
GFR, Estimated: >60 mL/min (ref >60)
Glucose, Bld: 140 mg/dL — ABNORMAL HIGH (ref 70–99)
Potassium: 4.1 mmol/L (ref 3.5–5.1)
Sodium: 140 mmol/L (ref 135–145)
Total Bilirubin: 1.3 mg/dL — ABNORMAL HIGH (ref 0.3–1.2)
Total Protein: 7 g/dL (ref 6.5–8.1)

## 2022-07-13 LAB — IRON AND IRON BINDING CAPACITY (CC-WL,HP ONLY)
Iron: 66 ug/dL (ref 45–182)
Saturation Ratios: 16 % — ABNORMAL LOW (ref 17.9–39.5)
TIBC: 424 ug/dL (ref 250–450)
UIBC: 358 ug/dL (ref 117–376)

## 2022-07-13 LAB — FERRITIN: Ferritin: 22 ng/mL — ABNORMAL LOW (ref 24–336)

## 2022-07-13 NOTE — Progress Notes (Signed)
Hematology and Oncology Follow Up Visit  Dale [name] [id number] [birth year] 62 y.o. 07/13/2022   Principle Diagnosis:  Iron deficiency anemia after frequent phlebotomy for elevated Hgb with testosterone use  Current Therapy:   IV iron as indicated   Interim History:  Dale [name] is here today for follow-up.    He is currently on testosterone 0.5 ml IM weekly.  He is take 81 mg asa daily and tolerating this well without bleeding episodes  He has occasional mild SOB with over exertion.  No fever, chills, n/v, cough, rash, dizziness, SOB, chest pain, palpitations, abdominal pain or changes in bowel or bladder habits at this time.  No bleeding, bruising or petechiae.  No swelling, tenderness, numbness or tingling in his extremities.  No falls or syncope.  Appetite and hydration are good. Weight is stable Wt Readings from Last 3 Encounters:  07/13/22 201 lb 0.6 oz (91.2 kg)  06/02/22 200 lb (90.7 kg)  05/22/22 198 lb (89.8 kg)    ECOG Performance Status: 0 - Asymptomatic  Medications:  Allergies as of 07/13/2022       Reactions   Lisinopril Cough        Medication List        Accurate as of July 13, 2022 10:46 AM. If you have any questions, ask your nurse or doctor.          allopurinol 300 MG tablet Commonly known as: ZYLOPRIM TAKE 1 TABLET DAILY   amLODipine 5 MG tablet Commonly known as: NORVASC TAKE 1 TABLET DAILY   anastrozole 1 MG tablet Commonly known as: ARIMIDEX Take 0.5 mg by mouth once a week.   atorvastatin 10 MG tablet Commonly known as: LIPITOR TAKE 1 TABLET EVERY MORNING   cyanocobalamin 1000 MCG tablet Commonly known as: VITAMIN B12 Take 1,000 mcg by mouth once a week.   GLUCOSAMINE CHONDROITIN TRIPLE PO Take by mouth daily. With Tumeric   HYDROcodone-acetaminophen 5-325 MG tablet Commonly known as: NORCO/VICODIN Take 1 tablet by mouth every 4 (four) hours as needed for moderate pain.   hydrocortisone cream 1 % Apply 1  application. topically as needed for itching.   Iron (Ferrous Sulfate) 325 (65 Fe) MG Tabs Take 1 tablet my mouth daily.   Jublia 10 % Soln Generic drug: Efinaconazole Apply topically daily.   LORazepam 2 MG tablet Commonly known as: ATIVAN TAKE 1 TABLET BY MOUTH EVERY 6 HOURS AS NEEDED   MAGNESIUM GLYCINATE PO Take by mouth.   omeprazole 20 MG capsule Commonly known as: PRILOSEC Take 20 mg by mouth daily.   ONE-A-DAY MENS PO Take 1 capsule by mouth daily.   oxyCODONE-acetaminophen 5-325 MG tablet Commonly known as: PERCOCET/ROXICET Take 1 tablet by mouth every 4 (four) hours as needed for severe pain.   Pregnyl 40981 units injection Generic drug: human chorionic gonadotropin 0.4 ml ( 400 iu) Sub Q on day 3 and day 5 post testosterone injection   sildenafil 100 MG tablet Commonly known as: VIAGRA TAKE 1 TABLET BY MOUTH DAILY AS NEEDED FOR ERECTILE DYSFUNCTION   testosterone cypionate 100 MG/ML injection Commonly known as: DEPOTESTOTERONE CYPIONATE Inject into the muscle once a week. For IM use only- Takes 0.7 ml/ weekly.   valACYclovir 500 MG tablet Commonly known as: VALTREX TAKE 1 TABLET DAILY   VITAMIN K2-VITAMIN D3 PO Take by mouth daily.        Allergies:  Allergies  Allergen Reactions   Lisinopril Cough    Past Medical History, Surgical history, Social  history, and Family History were reviewed and updated.  Review of Systems: All other 10 point review of systems is negative.   Physical Exam:  height is 5\' 10"  (1.778 m) and weight is 201 lb 0.6 oz (91.2 kg). His oral temperature is 98.4 F (36.9 C). His blood pressure is 136/97 (abnormal) and his pulse is 110 (abnormal). His respiration is 19 and oxygen saturation is 94%.   Wt Readings from Last 3 Encounters:  07/13/22 201 lb 0.6 oz (91.2 kg)  06/02/22 200 lb (90.7 kg)  05/22/22 198 lb (89.8 kg)   Constitutional: Aax4, NAD Ocular: Sclerae unicteric, pupils equal, round and reactive to  light Ear-nose-throat: Oropharynx clear, dentition fair Lymphatic: No cervical or supraclavicular adenopathy Lungs no rales or rhonchi, good excursion bilaterally Heart regular rate and rhythm, no murmur appreciated Neuro: non-focal, well-oriented, appropriate affect  Lab Results  Component Value Date   WBC 6.5 07/13/2022   HGB 19.4 (H) 07/13/2022   HCT 57.6 (H) 07/13/2022   MCV 89.4 07/13/2022   PLT 160 07/13/2022   Lab Results  Component Value Date   FERRITIN 29 06/23/2022   IRON 251 (H) 06/23/2022   TIBC 386 06/23/2022   UIBC 135 06/23/2022   IRONPCTSAT 65 (H) 06/23/2022   Lab Results  Component Value Date   RETICCTPCT 1.6 06/02/2022   RBC 6.44 (H) 07/13/2022   No results found for: "KPAFRELGTCHN", "LAMBDASER", "KAPLAMBRATIO" No results found for: "IGGSERUM", "IGA", "IGMSERUM" No results found for: "TOTALPROTELP", "ALBUMINELP", "A1GS", "A2GS", "BETS", "BETA2SER", "GAMS", "MSPIKE", "SPEI"   Chemistry      Component Value Date/Time   NA 140 07/13/2022 1002   K 4.1 07/13/2022 1002   CL 107 07/13/2022 1002   CO2 24 07/13/2022 1002   BUN 14 07/13/2022 1002   CREATININE 0.95 07/13/2022 1002   CREATININE 0.97 12/03/2021 1139      Component Value Date/Time   CALCIUM 9.7 07/13/2022 1002   ALKPHOS 83 07/13/2022 1002   AST 42 (H) 07/13/2022 1002   ALT 58 (H) 07/13/2022 1002   BILITOT 1.3 (H) 07/13/2022 1002     Impression and Plan: Dale [name] is a 62 yo gentleman with history of iron deficiency anemia after agressive phlebotomizing at an outside office as well as likely malabsorption. 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   Hgb after IV iron and with testosterone injection continues to be elevated at 19.4, hematocrit is 57.6.  He will continue his EC 81 mg aspirin Po daily with dinner.  He will continue his testosterone at 0.5 ml IM weekly. Plan to lower at next visit if Hgb is still elevated above 18 Phlebotomy today for high Hgb.     Iron  studies are pending.    Disposition Phlebotomy today RTC 3 weeks labs only (CBC w/, iron, ferritin) RTC 6 weeks APP, labs ( CBC w/, iron, ferritin) +- phlebotomy.     [name], PA-C 6/24/202410:46 AM

## 2022-07-13 NOTE — Progress Notes (Signed)
Dale [name] presents today for phlebotomy per MD orders. Phlebotomy procedure started at 1045 and ended at 1055. 500 grams removed. Patient observed for 30 minutes after procedure without any incident. Patient tolerated procedure well. IV needle removed intact.

## 2022-07-13 NOTE — Patient Instructions (Signed)

## 2022-07-19 ENCOUNTER — Other Ambulatory Visit: Payer: Self-pay | Admitting: Family Medicine

## 2022-07-19 DIAGNOSIS — M1A0620 Idiopathic chronic gout, left knee, without tophus (tophi): Secondary | ICD-10-CM

## 2022-07-31 ENCOUNTER — Other Ambulatory Visit: Payer: Self-pay

## 2022-07-31 DIAGNOSIS — D508 Other iron deficiency anemias: Secondary | ICD-10-CM

## 2022-08-03 ENCOUNTER — Inpatient Hospital Stay: Payer: 59 | Attending: Hematology & Oncology

## 2022-08-03 DIAGNOSIS — D509 Iron deficiency anemia, unspecified: Secondary | ICD-10-CM | POA: Insufficient documentation

## 2022-08-03 DIAGNOSIS — D649 Anemia, unspecified: Secondary | ICD-10-CM

## 2022-08-03 DIAGNOSIS — D508 Other iron deficiency anemias: Secondary | ICD-10-CM

## 2022-08-03 LAB — RETICULOCYTES
Immature Retic Fract: 10.9 % (ref 2.3–15.9)
RBC.: 6.26 MIL/uL — ABNORMAL HIGH (ref 4.22–5.81)
Retic Count, Absolute: 129.6 10*3/uL (ref 19.0–186.0)
Retic Ct Pct: 2.1 % (ref 0.4–3.1)

## 2022-08-03 LAB — CBC WITH DIFFERENTIAL (CANCER CENTER ONLY)
Abs Immature Granulocytes: 0.03 10*3/uL (ref 0.00–0.07)
Basophils Absolute: 0.1 10*3/uL (ref 0.0–0.1)
Basophils Relative: 1 %
Eosinophils Absolute: 0.1 10*3/uL (ref 0.0–0.5)
Eosinophils Relative: 2 %
HCT: 57.4 % — ABNORMAL HIGH (ref 39.0–52.0)
Hemoglobin: 19.2 g/dL — ABNORMAL HIGH (ref 13.0–17.0)
Immature Granulocytes: 1 %
Lymphocytes Relative: 21 %
Lymphs Abs: 1.2 10*3/uL (ref 0.7–4.0)
MCH: 30.4 pg (ref 26.0–34.0)
MCHC: 33.4 g/dL (ref 30.0–36.0)
MCV: 91 fL (ref 80.0–100.0)
Monocytes Absolute: 0.7 10*3/uL (ref 0.1–1.0)
Monocytes Relative: 11 %
Neutro Abs: 3.7 10*3/uL (ref 1.7–7.7)
Neutrophils Relative %: 64 %
Platelet Count: 145 10*3/uL — ABNORMAL LOW (ref 150–400)
RBC: 6.31 MIL/uL — ABNORMAL HIGH (ref 4.22–5.81)
RDW: 14.6 % (ref 11.5–15.5)
WBC Count: 5.8 10*3/uL (ref 4.0–10.5)
nRBC: 0 % (ref 0.0–0.2)

## 2022-08-03 LAB — IRON AND IRON BINDING CAPACITY (CC-WL,HP ONLY)
Iron: 210 ug/dL — ABNORMAL HIGH (ref 45–182)
Saturation Ratios: 51 % — ABNORMAL HIGH (ref 17.9–39.5)
TIBC: 416 ug/dL (ref 250–450)
UIBC: 206 ug/dL (ref 117–376)

## 2022-08-03 LAB — FERRITIN: Ferritin: 31 ng/mL (ref 24–336)

## 2022-08-05 ENCOUNTER — Telehealth: Payer: Self-pay | Admitting: *Deleted

## 2022-08-05 NOTE — Telephone Encounter (Signed)
Per staff message Mckenzie - Called patient and lvm for a callback to schedule a phlebotomy.

## 2022-08-10 ENCOUNTER — Inpatient Hospital Stay: Payer: 59

## 2022-08-10 NOTE — Patient Instructions (Signed)

## 2022-08-10 NOTE — Progress Notes (Signed)
Dale [name] presents today for phlebotomy per MD orders. Phlebotomy procedure started at 1232 and ended at 1240. 550 grams removed. Patient refused to stay and be observed for 30 minutes after procedure. Patient tolerated procedure well. IV needle removed intact.

## 2022-08-18 ENCOUNTER — Other Ambulatory Visit: Payer: Self-pay | Admitting: Family Medicine

## 2022-08-20 ENCOUNTER — Other Ambulatory Visit (INDEPENDENT_AMBULATORY_CARE_PROVIDER_SITE_OTHER): Payer: 59

## 2022-08-20 ENCOUNTER — Ambulatory Visit: Payer: 59 | Admitting: Orthopedic Surgery

## 2022-08-20 DIAGNOSIS — M25562 Pain in left knee: Secondary | ICD-10-CM | POA: Diagnosis not present

## 2022-08-20 DIAGNOSIS — G8929 Other chronic pain: Secondary | ICD-10-CM | POA: Diagnosis not present

## 2022-08-24 ENCOUNTER — Inpatient Hospital Stay (HOSPITAL_BASED_OUTPATIENT_CLINIC_OR_DEPARTMENT_OTHER): Payer: 59 | Admitting: Medical Oncology

## 2022-08-24 ENCOUNTER — Other Ambulatory Visit: Payer: Self-pay | Admitting: Medical Oncology

## 2022-08-24 ENCOUNTER — Encounter: Payer: Self-pay | Admitting: Medical Oncology

## 2022-08-24 ENCOUNTER — Inpatient Hospital Stay: Payer: 59 | Attending: Hematology & Oncology

## 2022-08-24 ENCOUNTER — Other Ambulatory Visit: Payer: Self-pay

## 2022-08-24 VITALS — BP 136/85 | HR 96 | Temp 98.3°F | Resp 18 | Ht 70.0 in | Wt 202.8 lb

## 2022-08-24 DIAGNOSIS — D509 Iron deficiency anemia, unspecified: Secondary | ICD-10-CM

## 2022-08-24 DIAGNOSIS — R0602 Shortness of breath: Secondary | ICD-10-CM | POA: Diagnosis not present

## 2022-08-24 DIAGNOSIS — R194 Change in bowel habit: Secondary | ICD-10-CM | POA: Insufficient documentation

## 2022-08-24 DIAGNOSIS — Z888 Allergy status to other drugs, medicaments and biological substances status: Secondary | ICD-10-CM | POA: Diagnosis not present

## 2022-08-24 DIAGNOSIS — D751 Secondary polycythemia: Secondary | ICD-10-CM

## 2022-08-24 DIAGNOSIS — Z79899 Other long term (current) drug therapy: Secondary | ICD-10-CM | POA: Diagnosis not present

## 2022-08-24 LAB — CBC
HCT: 55.7 % — ABNORMAL HIGH (ref 39.0–52.0)
Hemoglobin: 18.7 g/dL — ABNORMAL HIGH (ref 13.0–17.0)
MCH: 30.9 pg (ref 26.0–34.0)
MCHC: 33.6 g/dL (ref 30.0–36.0)
MCV: 91.9 fL (ref 80.0–100.0)
Platelets: 178 10*3/uL (ref 150–400)
RBC: 6.06 MIL/uL — ABNORMAL HIGH (ref 4.22–5.81)
RDW: 13.2 % (ref 11.5–15.5)
WBC: 7.6 10*3/uL (ref 4.0–10.5)
nRBC: 0 % (ref 0.0–0.2)

## 2022-08-24 LAB — RETICULOCYTES
Immature Retic Fract: 21.9 % — ABNORMAL HIGH (ref 2.3–15.9)
RBC.: 6.05 MIL/uL — ABNORMAL HIGH (ref 4.22–5.81)
Retic Count, Absolute: 136.1 10*3/uL (ref 19.0–186.0)
Retic Ct Pct: 2.3 % (ref 0.4–3.1)

## 2022-08-24 LAB — IRON AND IRON BINDING CAPACITY (CC-WL,HP ONLY)
Iron: 134 ug/dL (ref 45–182)
Saturation Ratios: 31 % (ref 17.9–39.5)
TIBC: 438 ug/dL (ref 250–450)
UIBC: 304 ug/dL (ref 117–376)

## 2022-08-24 LAB — FERRITIN: Ferritin: 20 ng/mL — ABNORMAL LOW (ref 24–336)

## 2022-08-24 NOTE — Progress Notes (Signed)
Hematology and Oncology Follow Up Visit  FUE STIPES [id number] 1960/07/16 62 y.o. 08/24/2022   Principle Diagnosis:  Iron deficiency anemia after frequent phlebotomy for elevated Hgb with testosterone use  Current Therapy:   IV iron as indicated   Interim History:  Mr. Dale [name] is here today for follow-up.   He reports that he has been ok since our last follow up. He reports that his dose of testosterone was decreased about 6 weeks ago. He also had a phlebotomy 2 weeks ago.    He is currently on testosterone 0.5 ml IM weekly.  He is take 81 mg asa daily and tolerating this well without bleeding episodes  He also takes OTC iron supplementation twice daily as he reports that his iron was low prior to his phlebotomies. He reports that he is due for a colonoscopy. He does have stool changes but denies melena.  He has occasional mild SOB with over exertion.  No fever, chills, n/v, cough, rash, dizziness, SOB, chest pain, palpitations, abdominal pain  No bleeding, bruising or petechiae.  No swelling, tenderness, numbness or tingling in his extremities.  No falls or syncope.  Appetite and hydration are good. Weight is stable Wt Readings from Last 3 Encounters:  08/24/22 202 lb 12.8 oz (92 kg)  07/13/22 201 lb 0.6 oz (91.2 kg)  06/02/22 200 lb (90.7 kg)    ECOG Performance Status: 0 - Asymptomatic  Medications:  Allergies as of 08/24/2022       Reactions   Lisinopril Cough        Medication List        Accurate as of August 24, 2022  2:18 PM. If you have any questions, ask your nurse or doctor.          allopurinol 300 MG tablet Commonly known as: ZYLOPRIM TAKE 1 TABLET DAILY   amLODipine 5 MG tablet Commonly known as: NORVASC TAKE 1 TABLET DAILY   anastrozole 1 MG tablet Commonly known as: ARIMIDEX Take 0.5 mg by mouth once a week.   atorvastatin 10 MG tablet Commonly known as: LIPITOR TAKE 1 TABLET EVERY MORNING   cyanocobalamin 1000 MCG tablet Commonly  known as: VITAMIN B12 Take 1,000 mcg by mouth once a week.   GLUCOSAMINE CHONDROITIN TRIPLE PO Take by mouth daily. With Tumeric   HYDROcodone-acetaminophen 5-325 MG tablet Commonly known as: NORCO/VICODIN Take 1 tablet by mouth every 4 (four) hours as needed for moderate pain.   hydrocortisone cream 1 % Apply 1 application. topically as needed for itching.   Iron (Ferrous Sulfate) 325 (65 Fe) MG Tabs Take 1 tablet my mouth daily.   Jublia 10 % Soln Generic drug: Efinaconazole Apply topically daily.   LORazepam 2 MG tablet Commonly known as: ATIVAN TAKE 1 TABLET BY MOUTH EVERY 6 HOURS AS NEEDED   MAGNESIUM GLYCINATE PO Take by mouth.   omeprazole 20 MG capsule Commonly known as: PRILOSEC Take 20 mg by mouth daily.   ONE-A-DAY MENS PO Take 1 capsule by mouth daily.   oxyCODONE-acetaminophen 5-325 MG tablet Commonly known as: PERCOCET/ROXICET Take 1 tablet by mouth every 4 (four) hours as needed for severe pain.   Pregnyl 84696 units injection Generic drug: human chorionic gonadotropin 0.4 ml ( 400 iu) Sub Q on day 3 and day 5 post testosterone injection   sildenafil 100 MG tablet Commonly known as: VIAGRA TAKE 1 TABLET BY MOUTH DAILY AS NEEDED FOR ERECTILE DYSFUNCTION   testosterone cypionate 100 MG/ML injection Commonly known as: DEPOTESTOTERONE  CYPIONATE Inject into the muscle once a week. For IM use only- Takes 0.7 ml/ weekly.   valACYclovir 500 MG tablet Commonly known as: VALTREX TAKE 1 TABLET DAILY   VITAMIN K2-VITAMIN D3 PO Take by mouth daily.        Allergies:  Allergies  Allergen Reactions   Lisinopril Cough    Past Medical History, Surgical history, Social history, and Family History were reviewed and updated.  Review of Systems: All other 10 point review of systems is negative.   Physical Exam:  height is 5\' 10"  (1.778 m) and weight is 202 lb 12.8 oz (92 kg). His oral temperature is 98.3 F (36.8 C). His blood pressure is 136/85 and  his pulse is 96. His respiration is 18 and oxygen saturation is 98%.   Wt Readings from Last 3 Encounters:  08/24/22 202 lb 12.8 oz (92 kg)  07/13/22 201 lb 0.6 oz (91.2 kg)  06/02/22 200 lb (90.7 kg)   Constitutional: Aax4, NAD Ocular: Sclerae unicteric, pupils equal, round and reactive to light Ear-nose-throat: Oropharynx clear, dentition fair Lymphatic: No cervical or supraclavicular adenopathy Lungs no rales or rhonchi, good excursion bilaterally Heart regular rate and rhythm, no murmur appreciated Neuro: non-focal, well-oriented, appropriate affect  Lab Results  Component Value Date   WBC 7.6 08/24/2022   HGB 18.7 (H) 08/24/2022   HCT 55.7 (H) 08/24/2022   MCV 91.9 08/24/2022   PLT 178 08/24/2022   Lab Results  Component Value Date   FERRITIN 20 (L) 08/24/2022   IRON 134 08/24/2022   TIBC 438 08/24/2022   UIBC 304 08/24/2022   IRONPCTSAT 31 08/24/2022   Lab Results  Component Value Date   RETICCTPCT 2.3 08/24/2022   RBC 6.06 (H) 08/24/2022   RBC 6.05 (H) 08/24/2022   No results found for: "KPAFRELGTCHN", "LAMBDASER", "KAPLAMBRATIO" No results found for: "IGGSERUM", "IGA", "IGMSERUM" No results found for: "TOTALPROTELP", "ALBUMINELP", "A1GS", "A2GS", "BETS", "BETA2SER", "GAMS", "MSPIKE", "SPEI"   Chemistry      Component Value Date/Time   NA 140 07/13/2022 1002   K 4.1 07/13/2022 1002   CL 107 07/13/2022 1002   CO2 24 07/13/2022 1002   BUN 14 07/13/2022 1002   CREATININE 0.95 07/13/2022 1002   CREATININE 0.97 12/03/2021 1139      Component Value Date/Time   CALCIUM 9.7 07/13/2022 1002   ALKPHOS 83 07/13/2022 1002   AST 42 (H) 07/13/2022 1002   ALT 58 (H) 07/13/2022 1002   BILITOT 1.3 (H) 07/13/2022 1002     Impression and Plan: Mr. Bridger is a 62 yo gentleman with history of iron deficiency anemia after agressive phlebotomizing at an outside office as well as likely malabsorption. 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   Hgb  after IV iron and with testosterone injection continues to be elevated but improved with dose adjustment: Hgb at 18.7 hematocrit is 55.7.  He will continue his EC 81 mg aspirin Po daily with dinner.  Continue working with Urology/endocrinology regarding his testosterone dose.  Stop iron supplementation- suggested GI follow up given low iron (prior to phlebotomies) and stool changes.  Phlebotomy today for high Hgb.     Iron studies are pending.    Disposition Phlebotomy today Stop iron as this may be elevating his Hgb further.  GI evaluation- pt to self schedule-he is aware RTC 4 weeks APP, labs, +_phlebotomy      [name], PA-C 8/5/20242:18 PM

## 2022-09-01 NOTE — Progress Notes (Unsigned)
Cardiology Office Note:   Date:  09/03/2022  NAME:  Dale [name]    MRN: [id number] DOB:  22-Feb-1960   PCP:  Nelwyn Salisbury, MD  Cardiologist:  None  Electrophysiologist:  None   Referring MD: Nelwyn Salisbury, MD   Chief Complaint  Patient presents with   coronary calcium    History of Present Illness:   Dale [name] is a 62 y.o. male with a hx of HTN, HLD who is being seen today for the evaluation of coronary calcium at the request of Nelwyn Salisbury, MD.   recently underwent CT scan for lung nodule follow-up.  Found to have mild LAD calcifications.  Evaluation by cardiology was recommended.  He does report a strong family history of heart disease in his father.  His father passed from congestive heart failure.  He also had grandparents who had strokes and heart attacks.  His medical history is significant for hypertension and hyperlipidemia.  He does report shortness of breath with activity.  Reports he can get winded with heavy activity.  No chest pain or pressure but does get short of breath.  He does have polycythemia.  His hemoglobin is 18.7.  This has been attributed to testosterone use despite reducing his dose of testosterone.  He does have sleep apnea so I think this could be contributing.  He may need evaluation for other genetic disorders.  He is working with hematology.  He reports he smokes 2 cigars/week.  Denies any excess alcohol use.  No drug use.  He is a retired Location manager.  He is divorced.  He has 1 child and 1 grandchild.  CV exam unremarkable.  EKG normal.  Symptoms of shortness of breath have occurred for the past 4 to 6 months.  Did not appear to be improving.  Possibly getting worse.  Problem List HTN HLD -T chol 159, HDL 51, LDL 94, TG 67 3. Mild LAD calcifications  4. Polycythemia  5. OSA  Past Medical History: Past Medical History:  Diagnosis Date   Allergic rhinitis, seasonal    Anxiety    Arthritis    back   Depression    Genital herpes    GERD  (gastroesophageal reflux disease)    Gross hematuria    Hiatal hernia    History of chest pain    s/p  nuclear stress test, 08-26-2009, normal perfusion without ischemia , ef 58%, stated non-cardiac chest pain   History of kidney stones    History of nonmelanoma skin cancer    excision left leg SCC;  excision of chest BCC    Hyperlipidemia    Hypertension    followed by pcp    Nocturia    OSA (obstructive sleep apnea)    Urethral lesion    prostatic   Wears glasses     Past Surgical History: Past Surgical History:  Procedure Laterality Date   COLONOSCOPY Bilateral 07/01/2018   per Dr. Rhea Belton, adenomatous polyp, repeat in 7 yrs   CYSTOSCOPY N/A 08/23/2019   Procedure: CYSTOSCOPY WITH TRANSURETHRAL BIOPSY;  Surgeon: Rene Paci, MD;  Location[facility];  Service: Urology;  Laterality: N/A;   ESOPHAGOGASTRODUODENOSCOPY  05/28/2017   KNEE ARTHROSCOPY Right 01/09/2013   Procedure: ARTHROSCOPY KNEE;  Surgeon: Eldred Manges, MD;  Location: [facility] OR;  Service: Orthopedics;  Laterality: Right;  Right knee scope, partial medial menisectomy   KNEE ARTHROSCOPY Left 2007   KNEE SURGERY Right 1983;  1989;  1990;  2001; last one @ [facility] 06-10-2000   LUMBAR DISC SURGERY  2018   POSTERIOR LUMBAR FUSION  12-21-2011  @[facility]    L4--5   ROTATOR CUFF REPAIR Left 2003   TOTAL KNEE ARTHROPLASTY Right 07/10/2013   Procedure: RIGHT TOTAL KNEE ARTHROPLASTY;  Surgeon: Loanne Drilling, MD;  Location: [facility] ORS;  Service: Orthopedics;  Laterality: Right;   VASECTOMY      Current Medications: Current Meds  Medication Sig   allopurinol (ZYLOPRIM) 300 MG tablet TAKE 1 TABLET DAILY   amLODipine (NORVASC) 5 MG tablet TAKE 1 TABLET DAILY   anastrozole (ARIMIDEX) 1 MG tablet Take 0.5 mg by mouth once a week.   atorvastatin (LIPITOR) 10 MG tablet TAKE 1 TABLET EVERY MORNING   human chorionic gonadotropin (PREGNYL) 40981 units injection 0.4 ml ( 400 iu) Sub Q on day 3 and day 5 post  testosterone injection   hydrocortisone cream 1 % Apply 1 application. topically as needed for itching.   Iron, Ferrous Sulfate, 325 (65 Fe) MG TABS Take 1 tablet my mouth daily.   JUBLIA 10 % SOLN Apply topically daily.   LORazepam (ATIVAN) 2 MG tablet TAKE 1 TABLET BY MOUTH EVERY 6 HOURS AS NEEDED   MAGNESIUM GLYCINATE PO Take by mouth.   metoprolol tartrate (LOPRESSOR) 100 MG tablet Take 100 mg 2 hours before Coronary CT   Misc Natural Products (GLUCOSAMINE CHONDROITIN TRIPLE PO) Take by mouth daily. With Tumeric   Multiple Vitamin (ONE-A-DAY MENS PO) Take 1 capsule by mouth daily.   omeprazole (PRILOSEC) 20 MG capsule Take 20 mg by mouth daily.   sildenafil (VIAGRA) 100 MG tablet TAKE 1 TABLET BY MOUTH DAILY AS NEEDED FOR ERECTILE DYSFUNCTION   testosterone cypionate (DEPOTESTOTERONE CYPIONATE) 100 MG/ML injection Inject into the muscle once a week. For IM use only- Takes 0.7 ml/ weekly.   valACYclovir (VALTREX) 500 MG tablet TAKE 1 TABLET DAILY   vitamin B-12 (CYANOCOBALAMIN) 1000 MCG tablet Take 1,000 mcg by mouth once a week.   Vitamin D-Vitamin K (VITAMIN K2-VITAMIN D3 PO) Take by mouth daily.     Allergies:    Lisinopril   Social History: Social History   Socioeconomic History   Marital status: Divorced    Spouse name: Not on file   Number of children: 1   Years of education: Not on file   Highest education level: Associate degree: occupational, Scientist, product/process development, or vocational program  Occupational History   Occupation: Retired - Systems analyst work  Tobacco Use   Smoking status: Some Days    Types: Cigarettes   Smokeless tobacco: Never   Tobacco comments:    09/03/2022 Patient smokes a cigar occasionaly  Vaping Use   Vaping status: Never Used  Substance and Sexual Activity   Alcohol use: Yes    Comment: occasional   Drug use: Never   Sexual activity: Yes    Comment: VASECTOMY  Other Topics Concern   Not on file  Social History Narrative   Not on file   Social  Determinants of Health   Financial Resource Strain: Patient Declined (03/17/2022)   Overall Financial Resource Strain (CARDIA)    Difficulty of Paying Living Expenses: Patient declined  Food Insecurity: No Food Insecurity (03/31/2022)   Hunger Vital Sign    Worried About Running Out of Food in the Last Year: Never true    Ran Out of Food in the Last Year: Never true  Transportation Needs: No Transportation Needs (03/17/2022)   PRAPARE - Transportation    Lack  of Transportation (Medical): No    Lack of Transportation (Non-Medical): No  Physical Activity: Unknown (03/17/2022)   Exercise Vital Sign    Days of Exercise per Week: 3 days    Minutes of Exercise per Session: Patient declined  Stress: Patient Declined (03/17/2022)   [employer] of Occupational Health - Occupational Stress Questionnaire    Feeling of Stress : Patient declined  Social Connections: Unknown (03/17/2022)   Social Connection and Isolation Panel [NHANES]    Frequency of Communication with Friends and Family: Patient declined    Frequency of Social Gatherings with Friends and Family: Patient declined    Attends Religious Services: Patient declined    Database administrator or Organizations: Patient declined    Attends Engineer, structural: Not on file    Marital Status: Divorced     Family History: The patient's family history includes Coronary artery disease in his father; Depression in an other family member; Heart attack in his father; Heart disease in his father; Hyperlipidemia in his mother and another family member; Hypertension in his mother and another family member; Lung cancer in an other family member; Stroke in an other family member. There is no history of Colon cancer, Esophageal cancer, Rectal cancer, Stomach cancer, or Colon polyps.  ROS:   All other ROS reviewed and negative. Pertinent positives noted in the HPI.     EKGs/Labs/Other Studies Reviewed:   The following studies were  personally reviewed by me today:  EKG:  EKG is  ordered today.    EKG Interpretation Date/Time:  Thursday September 03 2022 13:26:32 EDT Ventricular Rate:  78 PR Interval:  138 QRS Duration:  86 QT Interval:  358 QTC Calculation: 408 R Axis:   2  Text Interpretation: Normal sinus rhythm Normal ECG Confirmed by Lennie Odor ([id number]) on 09/03/2022 1:32:19 PM   Recent Labs: 12/15/2021: TSH 1.51 07/13/2022: ALT 58; BUN 14; Creatinine 0.95; Potassium 4.1; Sodium 140 08/24/2022: Hemoglobin 18.7; Platelets 178   Recent Lipid Panel    Component Value Date/Time   CHOL 159 12/15/2021 1030   TRIG 67.0 12/15/2021 1030   HDL 51.40 12/15/2021 1030   CHOLHDL 3 12/15/2021 1030   VLDL 13.4 12/15/2021 1030   LDLCALC 94 12/15/2021 1030   LDLDIRECT 149.7 05/22/2011 0917    Physical Exam:   VS:  BP 122/76 (BP Location: Left Arm, Patient Position: Sitting, Cuff Size: Large)   Pulse 78   Ht 5\' 10"  (1.778 m)   Wt 199 lb (90.3 kg)   SpO2 95%   BMI 28.55 kg/m    Wt Readings from Last 3 Encounters:  09/03/22 199 lb (90.3 kg)  08/24/22 202 lb 12.8 oz (92 kg)  07/13/22 201 lb 0.6 oz (91.2 kg)    General: Well nourished, well developed, in no acute distress Head: Atraumatic, normal size  Eyes: PEERLA, EOMI  Neck: Supple, no JVD Endocrine: No thryomegaly Cardiac: Normal S1, S2; RRR; no murmurs, rubs, or gallops Lungs: Clear to auscultation bilaterally, no wheezing, rhonchi or rales  Abd: Soft, nontender, no hepatomegaly  Ext: No edema, pulses 2+ Musculoskeletal: No deformities, BUE and BLE strength normal and equal Skin: Warm and dry, no rashes   Neuro: Alert and oriented to person, place, time, and situation, CNII-XII grossly intact, no focal deficits  Psych: Normal mood and affect   ASSESSMENT:   TYREKE BASSINGER is a 62 y.o. male who presents for the following: 1. SOB (shortness of breath) on exertion   2. Coronary  artery calcification seen on computed tomography   3. Mixed hyperlipidemia      PLAN:   1. SOB (shortness of breath) on exertion 2. Coronary artery calcification seen on computed tomography 3. Mixed hyperlipidemia -Shortness of breath with activity.  Could be related to polycythemia.  No chest pain or pressure but does have coronary calcium seen on chest imaging.  For further evaluation I recommended a coronary CTA and echocardiogram.  He will take 100 mg of metoprolol tartrate 2 hours before the scan.  Needs a BMP that is updated.  He will also continue to work with hematology on his polycythemia.  He is on a statin.  May need more aggressive LDL reduction.   Disposition: Return in about 6 months (around 03/06/2023).  Medication Adjustments/Labs and Tests Ordered: Current medicines are reviewed at length with the patient today.  Concerns regarding medicines are outlined above.  Orders Placed This Encounter  Procedures   CT CORONARY MORPH W/CTA COR W/SCORE W/CA W/CM &/OR WO/CM   Basic metabolic panel   EKG 12-Lead   ECHOCARDIOGRAM COMPLETE   Meds ordered this encounter  Medications   metoprolol tartrate (LOPRESSOR) 100 MG tablet    Sig: Take 100 mg 2 hours before Coronary CT    Dispense:  1 tablet    Refill:  0   Patient Instructions  Medication Instructions:  Continue same medications *If you need a refill on your cardiac medications before your next appointment, please call your pharmacy*   Lab Work: Bmet today   Testing/Procedures: Coronary CT  will be scheduled after approved by insurance   Follow instructions below  Echo  Follow-Up: At [facility], you and your health needs are our priority.  As part of our continuing mission to provide you with exceptional heart care, we have created designated Provider Care Teams.  These Care Teams include your primary Cardiologist (physician) and Advanced Practice Providers (APPs -  Physician Assistants and Nurse Practitioners) who all work together to provide you with the care you need, when you  need it.  We recommend signing up for the patient portal called "MyChart".  Sign up information is provided on this After Visit Summary.  MyChart is used to connect with patients for Virtual Visits (Telemedicine).  Patients are able to view lab/test results, encounter notes, upcoming appointments, etc.  Non-urgent messages can be sent to your provider as well.   To learn more about what you can do with MyChart, go to [email or web address].    Your next appointment:  6 months    Provider:  Dr.O'Neal       Your cardiac CT will be scheduled at one of the below locations:   [facility] [address] [place], [place] [address] [phone]  OR  [facility] [address] [place], [place] [address] [phone]  OR   [facility] [address] [place], [place] [address] [phone]  If scheduled at [facility], please arrive at the [facility] and Children's Entrance (Entrance C2) of [facility] 30 minutes prior to test start time. You can use the FREE valet parking offered at entrance C (encouraged to control the heart rate for the test)  Proceed to the [facility] Radiology Department (first floor) to check-in and test prep.  All radiology patients and guests should use entrance C2 at [facility], accessed from [facility], even though the hospital's physical address listed is [address]  [employer].    If scheduled at [facility] or [facility], please arrive 15 mins early for check-in and test prep.  There is spacious parking and easy access to the radiology department from the [facility] Heart and Vascular entrance. Please enter here and check-in with the desk attendant.   Please follow these instructions carefully (unless otherwise directed):  An IV will be required for this test and Nitroglycerin will be given.   Hold all erectile dysfunction medications at least 3 days (72 hrs) prior to test. (Ie viagra, cialis, sildenafil, tadalafil, etc)   On the Night Before the Test: Be sure to Drink plenty of water. Do not consume any caffeinated/decaffeinated beverages or chocolate 12 hours prior to your test. Do not take any antihistamines 12 hours prior to your test.   On the Day of the Test: Drink plenty of water until 1 hour prior to the test. Do not eat any food 1 hour prior to test. You may take your regular medications prior to the test.  Take metoprolol 100 mg two hours prior to test. If you take Furosemide/Hydrochlorothiazide/Spironolactone, please HOLD on the morning of the test.        After the Test: Drink plenty of water. After receiving IV contrast, you may experience a mild flushed feeling. This is normal. On occasion, you may experience a mild rash up to 24 hours after the test. This is not dangerous. If this occurs, you can take Benadryl 25 mg and increase your fluid intake. If you experience trouble breathing, this can be serious. If it is severe call 911 IMMEDIATELY. If it is mild, please call our office. If you take any of these medications: Glipizide/Metformin, Avandament, Glucavance, please do not take 48 hours after completing test unless otherwise instructed.  We will call to schedule your test 2-4 weeks out understanding that some insurance companies will need an authorization prior to the service being performed.   For more information and frequently asked questions, please visit our website : [email or web address]  For non-scheduling related questions, please contact the cardiac imaging nurse navigator should you have any questions/concerns: Cardiac Imaging Nurse Navigators Direct Office Dial: [phone]   For scheduling needs, including cancellations and rescheduling, please call [place], [phone].       Signed, Lenna Gilford. Flora Lipps, MD, [facility] [place]  [facility]  [address], [address] [place], [place] [address] [phone]  09/03/2022 2:26 PM

## 2022-09-02 ENCOUNTER — Encounter: Payer: Self-pay | Admitting: Orthopedic Surgery

## 2022-09-02 DIAGNOSIS — M25562 Pain in left knee: Secondary | ICD-10-CM

## 2022-09-02 DIAGNOSIS — G8929 Other chronic pain: Secondary | ICD-10-CM

## 2022-09-02 MED ORDER — METHYLPREDNISOLONE ACETATE 40 MG/ML IJ SUSP
40.0000 mg | INTRAMUSCULAR | Status: AC | PRN
Start: 2022-09-02 — End: 2022-09-02
  Administered 2022-09-02: 40 mg via INTRA_ARTICULAR

## 2022-09-02 MED ORDER — LIDOCAINE HCL (PF) 1 % IJ SOLN
5.0000 mL | INTRAMUSCULAR | Status: AC | PRN
Start: 2022-09-02 — End: 2022-09-02
  Administered 2022-09-02: 5 mL

## 2022-09-02 NOTE — Progress Notes (Signed)
Office Visit Note   Patient: Dale [name]           Date of Birth: [birth year]           MRN: [id number] Visit Date: 08/20/2022              Requested by: Nelwyn Salisbury, MD [address] [place],  [place] [address] PCP: Nelwyn Salisbury, MD  Chief Complaint  Patient presents with   Left Knee - Pain      HPI: Patient is a 62 year old gentleman with osteoarthritis left knee.  He is status post a right total knee arthroplasty.  A steroid injection in the left knee last year.  Status post arthroscopic debridement of the left knee.  Patient states that the catching is getting more frequent complains of medial sided pain without swelling.  Patient reports pain with giving way.  Assessment & Plan: Visit Diagnoses:  1. Chronic pain of left knee     Plan: Left knee was injected he tolerated this well discussed that he may require total knee arthroplasty on the left.  Follow-Up Instructions: Return if symptoms worsen or fail to improve.   Ortho Exam  Patient is alert, oriented, no adenopathy, well-dressed, normal affect, normal respiratory effort. Examination radiograph shows calcified meniscus laterally.  Tender to palpation of the medial joint line collaterals and cruciates are stable crepitation with range of motion.  Imaging: No results found. No images are attached to the encounter.  Labs: Lab Results  Component Value Date   HGBA1C 6.0 12/15/2021   ESRSEDRATE 2 02/09/2020   LABURIC 3.3 (L) 12/03/2021   LABURIC 5.4 02/09/2020   REPTSTATUS 07/22/2013 FINAL 07/16/2013   GRAMSTAIN No WBC Seen 07/09/2015   GRAMSTAIN No Squamous Epithelial Cells Seen 07/09/2015   GRAMSTAIN No Organisms Seen 07/09/2015   CULT  07/16/2013    NO GROWTH 5 DAYS Performed at [facility]   LABORGA NO GROWTH 2 DAYS 07/09/2015     Lab Results  Component Value Date   ALBUMIN 4.7 07/13/2022   ALBUMIN 4.7 06/23/2022   ALBUMIN 4.2 03/31/2022    No results found for: "MG" No  results found for: "VD25OH"  No results found for: "PREALBUMIN"    Latest Ref Rng & Units 08/24/2022   10:03 AM 08/03/2022   10:38 AM 07/13/2022   10:02 AM  CBC EXTENDED  WBC 4.0 - 10.5 K/uL 7.6  5.8  6.5   RBC 4.22 - 5.81 MIL/uL 4.22 - 5.81 MIL/uL 6.06    6.05  6.26    6.31  6.44   Hemoglobin 13.0 - 17.0 g/dL 33.2  95.1  88.4   HCT 39.0 - 52.0 % 55.7  57.4  57.6   Platelets 150 - 400 K/uL 178  145  160   NEUT# 1.7 - 7.7 K/uL  3.7  4.3   Lymph# 0.7 - 4.0 K/uL  1.2  1.3      There is no height or weight on file to calculate BMI.  Orders:  Orders Placed This Encounter  Procedures   XR Knee 1-2 Views Left   No orders of the defined types were placed in this encounter.    Procedures: Large Joint Inj: L knee on 09/02/2022 5:05 PM Indications: pain and diagnostic evaluation Details: 22 G 1.5 in needle, anteromedial approach  Arthrogram: No  Medications: 5 mL lidocaine (PF) 1 %; 40 mg methylPREDNISolone acetate 40 MG/ML Outcome: tolerated well, no immediate complications Procedure, treatment alternatives, risks and  benefits explained, specific risks discussed. Consent was given by the patient. Immediately prior to procedure a time out was called to verify the correct patient, procedure, equipment, support staff and site/side marked as required. Patient was prepped and draped in the usual sterile fashion.      Clinical Data: No additional findings.  ROS:  All other systems negative, except as noted in the HPI. Review of Systems  Objective: Vital Signs: There were no vitals taken for this visit.  Specialty Comments:  No specialty comments available.  PMFS History: Patient Active Problem List   Diagnosis Date Noted   Environmental and seasonal allergies 05/22/2022   Multiple lung nodules on CT 05/22/2022   Iron deficiency anemia 03/18/2022   OSA (obstructive sleep apnea) 06/09/2021   Daytime sleepiness 05/12/2021   Idiopathic chronic gout, unspecified site,  without tophus (tophi) 02/14/2020   Synovitis of left knee 02/09/2020   S/P cervical spinal fusion 01/03/2020   GERD (gastroesophageal reflux disease) 03/01/2017   Right Achilles tendinitis 12/21/2016   HTN (hypertension) 03/03/2016   OA (osteoarthritis) of knee 07/10/2013   Genital herpes 05/02/2013   CHEST PAIN 08/19/2009   LOW BACK PAIN 06/03/2009   ERECTILE DYSFUNCTION 06/07/2008   HYPERLIPIDEMIA 04/03/2008   DEPRESSION 04/03/2008   INSOMNIA 04/03/2008   NEPHROLITHIASIS, HX OF 04/03/2008   Past Medical History:  Diagnosis Date   Allergic rhinitis, seasonal    Anxiety    Arthritis    back   Depression    Genital herpes    GERD (gastroesophageal reflux disease)    Gross hematuria    Hiatal hernia    History of chest pain    s/p  nuclear stress test, 08-26-2009, normal perfusion without ischemia , ef 58%, stated non-cardiac chest pain   History of kidney stones    History of nonmelanoma skin cancer    excision left leg SCC;  excision of chest BCC    Hyperlipidemia    Hypertension    followed by pcp    Nocturia    Urethral lesion    prostatic   Wears glasses     Family History  Problem Relation Age of Onset   Heart attack Father    Coronary artery disease Father    Heart disease Father    Depression Other        family hx   Hyperlipidemia Other        family hx   Hypertension Other        family hx   Lung cancer Other        family hx   Stroke Other        family hx   Hypertension Mother    Hyperlipidemia Mother    Colon cancer Neg Hx    Esophageal cancer Neg Hx    Rectal cancer Neg Hx    Stomach cancer Neg Hx    Colon polyps Neg Hx     Past Surgical History:  Procedure Laterality Date   COLONOSCOPY Bilateral 07/01/2018   per Dr. Rhea Belton, adenomatous polyp, repeat in 7 yrs   CYSTOSCOPY N/A 08/23/2019   Procedure: CYSTOSCOPY WITH TRANSURETHRAL BIOPSY;  Surgeon: Rene Paci, MD;  Location[facility];  Service: Urology;   Laterality: N/A;   ESOPHAGOGASTRODUODENOSCOPY  05/28/2017   KNEE ARTHROSCOPY Right 01/09/2013   Procedure: ARTHROSCOPY KNEE;  Surgeon: Eldred Manges, MD;  Location: [facility] OR;  Service: Orthopedics;  Laterality: Right;  Right knee scope, partial medial menisectomy   KNEE  ARTHROSCOPY Left 2007   KNEE SURGERY Right 1983;  1989;  1990; 2001; last one @ [facility] 06-10-2000   LUMBAR DISC SURGERY  2018   POSTERIOR LUMBAR FUSION  12-21-2011  @[facility]    L4--5   ROTATOR CUFF REPAIR Left 2003   TOTAL KNEE ARTHROPLASTY Right 07/10/2013   Procedure: RIGHT TOTAL KNEE ARTHROPLASTY;  Surgeon: Loanne Drilling, MD;  Location: [facility] ORS;  Service: Orthopedics;  Laterality: Right;   VASECTOMY     Social History   Occupational History   Not on file  Tobacco Use   Smoking status: Never   Smokeless tobacco: Never  Vaping Use   Vaping status: Never Used  Substance and Sexual Activity   Alcohol use: Yes    Comment: occasional   Drug use: Never   Sexual activity: Yes    Comment: VASECTOMY

## 2022-09-03 ENCOUNTER — Encounter: Payer: Self-pay | Admitting: Cardiovascular Disease

## 2022-09-03 ENCOUNTER — Ambulatory Visit: Payer: 59 | Attending: Cardiovascular Disease | Admitting: Cardiovascular Disease

## 2022-09-03 VITALS — BP 122/76 | HR 78 | Ht 70.0 in | Wt 199.0 lb

## 2022-09-03 DIAGNOSIS — R0602 Shortness of breath: Secondary | ICD-10-CM

## 2022-09-03 DIAGNOSIS — I2510 Atherosclerotic heart disease of native coronary artery without angina pectoris: Secondary | ICD-10-CM | POA: Diagnosis not present

## 2022-09-03 DIAGNOSIS — E782 Mixed hyperlipidemia: Secondary | ICD-10-CM

## 2022-09-03 MED ORDER — METOPROLOL TARTRATE 100 MG PO TABS: ORAL_TABLET | ORAL | 0 refills | Status: DC

## 2022-09-03 NOTE — Patient Instructions (Addendum)
Medication Instructions:  Continue same medications *If you need a refill on your cardiac medications before your next appointment, please call your pharmacy*   Lab Work: Bmet today   Testing/Procedures: Coronary CT  will be scheduled after approved by insurance   Follow instructions below  Echo  Follow-Up: At [facility], you and your health needs are our priority.  As part of our continuing mission to provide you with exceptional heart care, we have created designated Provider Care Teams.  These Care Teams include your primary Cardiologist (physician) and Advanced Practice Providers (APPs -  Physician Assistants and Nurse Practitioners) who all work together to provide you with the care you need, when you need it.  We recommend signing up for the patient portal called "MyChart".  Sign up information is provided on this After Visit Summary.  MyChart is used to connect with patients for Virtual Visits (Telemedicine).  Patients are able to view lab/test results, encounter notes, upcoming appointments, etc.  Non-urgent messages can be sent to your provider as well.   To learn more about what you can do with MyChart, go to [email or web address].    Your next appointment:  6 months    Provider:  Dr.O'Neal       Your cardiac CT will be scheduled at one of the below locations:   [facility] [address] [place], [place] [address] [phone]  OR  [facility] [address] [place], [place] [address] [phone]  OR   [facility] [address] [place], [place] [address] [phone]  If scheduled at [facility], please arrive at the [facility] and Children's Entrance (Entrance C2) of [facility] 30 minutes prior to test start time. You can use the FREE valet parking offered at entrance C (encouraged to control the heart rate for the test)  Proceed to the [facility] Radiology Department (first floor) to check-in and test prep.  All radiology patients and guests should use entrance C2 at [facility], accessed from [facility], even though the hospital's physical address listed is [address].    If scheduled at [facility] or [facility], please arrive 15 mins early for check-in and test prep.  There is spacious parking and easy access to the radiology department from the [facility] V[facility] entrance. Please enter here and check-in with the desk attendant.   Please follow these instructions carefully (unless otherwise directed):  An IV will be required for this test and Nitroglycerin will be given.  Hold all erectile dysfunction medications at least 3 days (72 hrs) prior to test. (Ie viagra, cialis, sildenafil, tadalafil, etc)   On the Night Before the Test: Be sure to Drink plenty of water. Do not consume any caffeinated/decaffeinated beverages or chocolate 12 hours prior to your test. Do not take any antihistamines 12 hours prior to your test.   On the Day of the Test: Drink plenty of water until 1 hour prior to the test. Do not eat any food 1 hour prior to test. You may take your regular medications prior to the test.  Take metoprolol 100 mg two hours prior to test. If you take Furosemide/Hydrochlorothiazide/Spironolactone, please HOLD on the morning of the test.        After the Test: Drink plenty of water. After receiving IV contrast, you may experience a mild flushed feeling. This is normal. On occasion,  you may experience a mild rash up to 24 hours after the test. This is not dangerous. If this occurs, you can take Benadryl 25 mg and increase your fluid intake. If you experience trouble breathing, this can be serious. If it is severe call 911 IMMEDIATELY. If it is mild, please call our office. If you take any of these medications: Glipizide/Metformin,  Avandament, Glucavance, please do not take 48 hours after completing test unless otherwise instructed.  We will call to schedule your test 2-4 weeks out understanding that some insurance companies will need an authorization prior to the service being performed.   For more information and frequently asked questions, please visit our website : [email or web address]  For non-scheduling related questions, please contact the cardiac imaging nurse navigator should you have any questions/concerns: Cardiac Imaging Nurse Navigators Direct Office Dial: [phone]   For scheduling needs, including cancellations and rescheduling, please call [place], [phone].

## 2022-09-04 LAB — BASIC METABOLIC PANEL
BUN/Creatinine Ratio: 16 (ref 10–24)
BUN: 16 mg/dL (ref 8–27)
CO2: 23 mmol/L (ref 20–29)
Calcium: 9.6 mg/dL (ref 8.6–10.2)
Chloride: 101 mmol/L (ref 96–106)
Creatinine, Ser: 0.98 mg/dL (ref 0.76–1.27)
Glucose: 87 mg/dL (ref 70–99)
Potassium: 4.2 mmol/L (ref 3.5–5.2)
Sodium: 141 mmol/L (ref 134–144)
eGFR: 88 mL/min/{1.73_m2} (ref >59)

## 2022-09-07 ENCOUNTER — Telehealth (HOSPITAL_COMMUNITY): Payer: Self-pay | Admitting: Emergency Medicine

## 2022-09-07 ENCOUNTER — Encounter (HOSPITAL_COMMUNITY): Payer: Self-pay

## 2022-09-07 NOTE — Telephone Encounter (Signed)
Reaching out to patient to offer assistance regarding upcoming cardiac imaging study; pt verbalizes understanding of appt date/time, parking situation and where to check in, pre-test NPO status and medications ordered, and verified current allergies; name and call back number provided for further questions should they arise Dale Alexandria RN Navigator Cardiac Imaging [facility] [phone] office [phone] cell  Pt states he took sildenafil this AM (Monday 8/19) which puts him roughly 50hrs prior to [facility] appt on Wednesday. I told him it was okay since sildenafil has a halflife of 4 hrs. [place] Dec

## 2022-09-09 ENCOUNTER — Ambulatory Visit (HOSPITAL_COMMUNITY)
Admission: RE | Admit: 2022-09-09 | Discharge: 2022-09-09 | Disposition: A | Discharge status: Home / Self Care | Payer: 59 | Source: Ambulatory Visit | Attending: Cardiovascular Disease | Admitting: Cardiovascular Disease

## 2022-09-09 DIAGNOSIS — E782 Mixed hyperlipidemia: Secondary | ICD-10-CM | POA: Diagnosis not present

## 2022-09-09 DIAGNOSIS — I2510 Atherosclerotic heart disease of native coronary artery without angina pectoris: Secondary | ICD-10-CM | POA: Diagnosis not present

## 2022-09-09 DIAGNOSIS — R0602 Shortness of breath: Secondary | ICD-10-CM | POA: Insufficient documentation

## 2022-09-09 MED ORDER — NITROGLYCERIN 0.4 MG SL SUBL
0.8000 mg | SUBLINGUAL_TABLET | Freq: Once | SUBLINGUAL | Status: AC
  Administered 2022-09-09: 0.8 mg via SUBLINGUAL

## 2022-09-09 MED ORDER — NITROGLYCERIN 0.4 MG SL SUBL
SUBLINGUAL_TABLET | SUBLINGUAL | Status: AC
  Filled 2022-09-09: qty 2

## 2022-09-09 MED ORDER — IOHEXOL 350 MG/ML SOLN
95.0000 mL | Freq: Once | INTRAVENOUS | Status: AC | PRN
  Administered 2022-09-09: 95 mL via INTRAVENOUS

## 2022-09-10 ENCOUNTER — Other Ambulatory Visit: Payer: Self-pay | Admitting: Adult Health

## 2022-09-11 ENCOUNTER — Other Ambulatory Visit: Payer: Self-pay

## 2022-09-11 DIAGNOSIS — R918 Other nonspecific abnormal finding of lung field: Secondary | ICD-10-CM

## 2022-09-14 ENCOUNTER — Inpatient Hospital Stay: Payer: 59

## 2022-09-14 DIAGNOSIS — D509 Iron deficiency anemia, unspecified: Secondary | ICD-10-CM | POA: Diagnosis not present

## 2022-09-14 DIAGNOSIS — R918 Other nonspecific abnormal finding of lung field: Secondary | ICD-10-CM

## 2022-09-14 LAB — CBC WITH DIFFERENTIAL (CANCER CENTER ONLY)
Abs Immature Granulocytes: 0.02 10*3/uL (ref 0.00–0.07)
Basophils Absolute: 0.1 10*3/uL (ref 0.0–0.1)
Basophils Relative: 1 %
Eosinophils Absolute: 0.1 10*3/uL (ref 0.0–0.5)
Eosinophils Relative: 2 %
HCT: 56 % — ABNORMAL HIGH (ref 39.0–52.0)
Hemoglobin: 18.6 g/dL — ABNORMAL HIGH (ref 13.0–17.0)
Immature Granulocytes: 0 %
Lymphocytes Relative: 18 %
Lymphs Abs: 1 10*3/uL (ref 0.7–4.0)
MCH: 30.8 pg (ref 26.0–34.0)
MCHC: 33.2 g/dL (ref 30.0–36.0)
MCV: 92.9 fL (ref 80.0–100.0)
Monocytes Absolute: 0.7 10*3/uL (ref 0.1–1.0)
Monocytes Relative: 13 %
Neutro Abs: 3.6 10*3/uL (ref 1.7–7.7)
Neutrophils Relative %: 66 %
Platelet Count: 172 10*3/uL (ref 150–400)
RBC: 6.03 MIL/uL — ABNORMAL HIGH (ref 4.22–5.81)
RDW: 12.7 % (ref 11.5–15.5)
WBC Count: 5.6 10*3/uL (ref 4.0–10.5)
nRBC: 0 % (ref 0.0–0.2)

## 2022-09-14 LAB — CMP (CANCER CENTER ONLY)
ALT: 44 U/L (ref 0–44)
AST: 25 U/L (ref 15–41)
Albumin: 4.7 g/dL (ref 3.5–5.0)
Alkaline Phosphatase: 74 U/L (ref 38–126)
Anion gap: 8 (ref 5–15)
BUN: 10 mg/dL (ref 6–20)
CO2: 26 mmol/L (ref 22–32)
Calcium: 9.4 mg/dL (ref 8.9–10.3)
Chloride: 104 mmol/L (ref 98–111)
Creatinine: 0.87 mg/dL (ref 0.61–1.24)
GFR, Estimated: >60 mL/min (ref >60)
Glucose, Bld: 127 mg/dL — ABNORMAL HIGH (ref 70–99)
Potassium: 4 mmol/L (ref 3.5–5.1)
Sodium: 138 mmol/L (ref 135–145)
Total Bilirubin: 2.1 mg/dL — ABNORMAL HIGH (ref 0.3–1.2)
Total Protein: 7 g/dL (ref 6.5–8.1)

## 2022-09-18 ENCOUNTER — Inpatient Hospital Stay: Payer: 59

## 2022-09-18 VITALS — BP 118/93 | HR 90 | Temp 98.2°F | Resp 17

## 2022-09-18 DIAGNOSIS — D509 Iron deficiency anemia, unspecified: Secondary | ICD-10-CM | POA: Diagnosis not present

## 2022-09-18 NOTE — Patient Instructions (Signed)

## 2022-09-18 NOTE — Progress Notes (Signed)
Juanita Craver presents today for phlebotomy per MD orders. Phlebotomy procedure started at 0918 and ended at 0925. 520 grams removed via 16 gauge needle to right AC using phlebotomy kit. Patient declined to stay for 30 minute observation period stating he has tolerated procedure before well without difficulty. Pt states he ate breakfast. Pt given water during procedure. Patient tolerated procedure well. IV needle removed intact.

## 2022-09-22 ENCOUNTER — Encounter: Payer: Self-pay | Admitting: Family

## 2022-09-22 ENCOUNTER — Other Ambulatory Visit: Payer: Self-pay

## 2022-09-22 ENCOUNTER — Inpatient Hospital Stay: Payer: 59 | Attending: Hematology & Oncology

## 2022-09-22 ENCOUNTER — Inpatient Hospital Stay: Payer: 59 | Admitting: Family

## 2022-09-22 ENCOUNTER — Inpatient Hospital Stay: Payer: 59

## 2022-09-22 VITALS — BP 130/92 | HR 105 | Temp 98.0°F | Resp 18 | Ht 70.0 in | Wt 197.1 lb

## 2022-09-22 DIAGNOSIS — D509 Iron deficiency anemia, unspecified: Secondary | ICD-10-CM

## 2022-09-22 DIAGNOSIS — D751 Secondary polycythemia: Secondary | ICD-10-CM | POA: Insufficient documentation

## 2022-09-22 DIAGNOSIS — Z79890 Hormone replacement therapy (postmenopausal): Secondary | ICD-10-CM | POA: Diagnosis not present

## 2022-09-22 DIAGNOSIS — D508 Other iron deficiency anemias: Secondary | ICD-10-CM | POA: Insufficient documentation

## 2022-09-22 LAB — CBC
HCT: 52.2 % — ABNORMAL HIGH (ref 39.0–52.0)
Hemoglobin: 17.7 g/dL — ABNORMAL HIGH (ref 13.0–17.0)
MCH: 31.2 pg (ref 26.0–34.0)
MCHC: 33.9 g/dL (ref 30.0–36.0)
MCV: 91.9 fL (ref 80.0–100.0)
Platelets: 184 10*3/uL (ref 150–400)
RBC: 5.68 MIL/uL (ref 4.22–5.81)
RDW: 12.5 % (ref 11.5–15.5)
WBC: 9.1 10*3/uL (ref 4.0–10.5)
nRBC: 0 % (ref 0.0–0.2)

## 2022-09-22 LAB — FERRITIN: Ferritin: 13 ng/mL — ABNORMAL LOW (ref 24–336)

## 2022-09-22 NOTE — Progress Notes (Signed)
Hematology and Oncology Follow Up Visit  Dale [name] [id number] 02-06-1960 62 y.o. 09/22/2022   Principle Diagnosis:  Iron deficiency anemia after frequent phlebotomy for elevated Hgb with testosterone use   Current Therapy:        IV iron as indicated in past now on oral iron every other day Now observation and phlebotomy PRN for elevated Hgb   Interim History:  Dale [name] is here today for follow-up. He had a phlebotomy last week per MD for Hgb 18.6 and Hct 56%. We go back and forth with iron deficiency vs erythrocytosis with oral iron and phlebotomy when needed.   He continues his weekly testosterone injection which has helped his quality of life.  He takes 1 baby aspirin daily.  No abnormal blood loss, bruising or petechiae.  He has not followed up with GI yet and states that he has not noted any blood loss.  He has untreated sleep apnea which is likely also a contributing factor to his erythrocytosis. He is considering following up with the sleep clinic to see what other mask options there are.  No fever, chills, n/v, cough, rash, dizziness, SOB, chest pain, palpitations, abdominal pain or changes in bladder habits at this time.  He notes several episodes of diarrhea in the mornings. He does drink coffee at that time. He states that he noted improvement with taking a probiotic in the past and will try this again.  No swelling in his extremities.  No falls or syncope reported.  Appetite and hydration are good. Weight is 197 lbs.   ECOG Performance Status: 1 - Symptomatic but completely ambulatory  Medications:  Allergies as of 09/22/2022       Reactions   Lisinopril Cough        Medication List        Accurate as of September 22, 2022  1:45 PM. If you have any questions, ask your nurse or doctor.          allopurinol 300 MG tablet Commonly known as: ZYLOPRIM TAKE 1 TABLET DAILY   amLODipine 5 MG tablet Commonly known as: NORVASC TAKE 1 TABLET DAILY    anastrozole 1 MG tablet Commonly known as: ARIMIDEX Take 0.5 mg by mouth once a week.   atorvastatin 10 MG tablet Commonly known as: LIPITOR TAKE 1 TABLET EVERY MORNING   cyanocobalamin 1000 MCG tablet Commonly known as: VITAMIN B12 Take 1,000 mcg by mouth once a week.   GLUCOSAMINE CHONDROITIN TRIPLE PO Take by mouth daily. With Tumeric   HYDROcodone-acetaminophen 5-325 MG tablet Commonly known as: NORCO/VICODIN Take 1 tablet by mouth every 4 (four) hours as needed for moderate pain.   hydrocortisone cream 1 % Apply 1 application. topically as needed for itching.   Iron (Ferrous Sulfate) 325 (65 Fe) MG Tabs Take 1 tablet my mouth daily.   Jublia 10 % Soln Generic drug: Efinaconazole Apply topically daily.   LORazepam 2 MG tablet Commonly known as: ATIVAN TAKE 1 TABLET BY MOUTH EVERY 6 HOURS AS NEEDED   MAGNESIUM GLYCINATE PO Take by mouth.   metoprolol tartrate 100 MG tablet Commonly known as: LOPRESSOR Take 100 mg 2 hours before Coronary CT   omeprazole 20 MG capsule Commonly known as: PRILOSEC Take 20 mg by mouth daily.   ONE-A-DAY MENS PO Take 1 capsule by mouth daily.   oxyCODONE-acetaminophen 5-325 MG tablet Commonly known as: PERCOCET/ROXICET Take 1 tablet by mouth every 4 (four) hours as needed for severe pain.   Pregnyl 59563  units injection Generic drug: human chorionic gonadotropin 0.4 ml ( 400 iu) Sub Q on day 3 and day 5 post testosterone injection   sildenafil 100 MG tablet Commonly known as: VIAGRA TAKE 1 TABLET BY MOUTH DAILY AS NEEDED FOR ERECTILE DYSFUNCTION   testosterone cypionate 100 MG/ML injection Commonly known as: DEPOTESTOTERONE CYPIONATE Inject into the muscle once a week. For IM use only- Takes 0.7 ml/ weekly.   valACYclovir 500 MG tablet Commonly known as: VALTREX TAKE 1 TABLET DAILY   VITAMIN K2-VITAMIN D3 PO Take by mouth daily.        Allergies:  Allergies  Allergen Reactions   Lisinopril Cough    Past  Medical History, Surgical history, Social history, and Family History were reviewed and updated.  Review of Systems: All other 10 point review of systems is negative.   Physical Exam:  vitals were not taken for this visit.   Wt Readings from Last 3 Encounters:  09/03/22 199 lb (90.3 kg)  08/24/22 202 lb 12.8 oz (92 kg)  07/13/22 201 lb 0.6 oz (91.2 kg)    Ocular: Sclerae unicteric, pupils equal, round and reactive to light Ear-nose-throat: Oropharynx clear, dentition fair Lymphatic: No cervical or supraclavicular adenopathy Lungs no rales or rhonchi, good excursion bilaterally Heart regular rate and rhythm, no murmur appreciated Abd soft, nontender, positive bowel sounds MSK no focal spinal tenderness, no joint edema Neuro: non-focal, well-oriented, appropriate affect Breasts: Deferred   Lab Results  Component Value Date   WBC 9.1 09/22/2022   HGB 17.7 (H) 09/22/2022   HCT 52.2 (H) 09/22/2022   MCV 91.9 09/22/2022   PLT 184 09/22/2022   Lab Results  Component Value Date   FERRITIN 20 (L) 08/24/2022   IRON 134 08/24/2022   TIBC 438 08/24/2022   UIBC 304 08/24/2022   IRONPCTSAT 31 08/24/2022   Lab Results  Component Value Date   RETICCTPCT 2.3 08/24/2022   RBC 5.68 09/22/2022   No results found for: "KPAFRELGTCHN", "LAMBDASER", "KAPLAMBRATIO" No results found for: "IGGSERUM", "IGA", "IGMSERUM" No results found for: "TOTALPROTELP", "ALBUMINELP", "A1GS", "A2GS", "BETS", "BETA2SER", "GAMS", "MSPIKE", "SPEI"   Chemistry      Component Value Date/Time   NA 138 09/14/2022 1047   NA 141 09/03/2022 1404   K 4.0 09/14/2022 1047   CL 104 09/14/2022 1047   CO2 26 09/14/2022 1047   BUN 10 09/14/2022 1047   BUN 16 09/03/2022 1404   CREATININE 0.87 09/14/2022 1047   CREATININE 0.97 12/03/2021 1139      Component Value Date/Time   CALCIUM 9.4 09/14/2022 1047   ALKPHOS 74 09/14/2022 1047   AST 25 09/14/2022 1047   ALT 44 09/14/2022 1047   BILITOT 2.1 (H) 09/14/2022  1047       Impression and Plan: Dale [name] is a 62 yo gentleman with history of iron deficiency anemia after agressive phlebotomizing at an outside office as well as likely malabsorption. 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  We will have him hold his iron supplement for now.  Lab check and phlebotomy every 6 weeks and follow-up in 3 months.   Eileen Stanford, NP 9/3/20241:45 PM

## 2022-09-23 ENCOUNTER — Encounter: Payer: Self-pay | Admitting: Family

## 2022-09-23 ENCOUNTER — Encounter: Payer: Self-pay | Admitting: Gastroenterology

## 2022-09-23 LAB — IRON AND IRON BINDING CAPACITY (CC-WL,HP ONLY)
Iron: 35 ug/dL — ABNORMAL LOW (ref 45–182)
Saturation Ratios: 8 % — ABNORMAL LOW (ref 17.9–39.5)
TIBC: 435 ug/dL (ref 250–450)
UIBC: 400 ug/dL — ABNORMAL HIGH (ref 117–376)

## 2022-09-24 ENCOUNTER — Encounter: Payer: Self-pay | Admitting: *Deleted

## 2022-09-28 ENCOUNTER — Ambulatory Visit (HOSPITAL_COMMUNITY): Payer: 59 | Attending: Cardiovascular Disease

## 2022-09-28 DIAGNOSIS — R0602 Shortness of breath: Secondary | ICD-10-CM | POA: Insufficient documentation

## 2022-09-28 DIAGNOSIS — E782 Mixed hyperlipidemia: Secondary | ICD-10-CM | POA: Diagnosis present

## 2022-09-28 DIAGNOSIS — I2510 Atherosclerotic heart disease of native coronary artery without angina pectoris: Secondary | ICD-10-CM | POA: Insufficient documentation

## 2022-09-28 LAB — ECHOCARDIOGRAM COMPLETE
Area-P 1/2: 4.21 cm2
P 1/2 time: 304 ms
S' Lateral: 3.2 cm

## 2022-10-05 ENCOUNTER — Other Ambulatory Visit: Payer: 59

## 2022-10-05 ENCOUNTER — Ambulatory Visit: Payer: 59 | Admitting: Medical Oncology

## 2022-10-17 ENCOUNTER — Other Ambulatory Visit: Payer: Self-pay | Admitting: Family Medicine

## 2022-10-17 DIAGNOSIS — M1A0620 Idiopathic chronic gout, left knee, without tophus (tophi): Secondary | ICD-10-CM

## 2022-11-04 ENCOUNTER — Inpatient Hospital Stay: Payer: 59 | Attending: Hematology & Oncology

## 2022-11-04 ENCOUNTER — Inpatient Hospital Stay: Payer: 59

## 2022-11-04 DIAGNOSIS — D508 Other iron deficiency anemias: Secondary | ICD-10-CM | POA: Diagnosis present

## 2022-11-04 DIAGNOSIS — D751 Secondary polycythemia: Secondary | ICD-10-CM | POA: Insufficient documentation

## 2022-11-04 DIAGNOSIS — D509 Iron deficiency anemia, unspecified: Secondary | ICD-10-CM

## 2022-11-04 LAB — CMP (CANCER CENTER ONLY)
ALT: 35 U/L (ref 0–44)
AST: 25 U/L (ref 15–41)
Albumin: 4.4 g/dL (ref 3.5–5.0)
Alkaline Phosphatase: 76 U/L (ref 38–126)
Anion gap: 9 (ref 5–15)
BUN: 12 mg/dL (ref 6–20)
CO2: 26 mmol/L (ref 22–32)
Calcium: 9.3 mg/dL (ref 8.9–10.3)
Chloride: 103 mmol/L (ref 98–111)
Creatinine: 0.82 mg/dL (ref 0.61–1.24)
GFR, Estimated: >60 mL/min (ref >60)
Glucose, Bld: 119 mg/dL — ABNORMAL HIGH (ref 70–99)
Potassium: 4.1 mmol/L (ref 3.5–5.1)
Sodium: 138 mmol/L (ref 135–145)
Total Bilirubin: 1.3 mg/dL — ABNORMAL HIGH (ref 0.3–1.2)
Total Protein: 7 g/dL (ref 6.5–8.1)

## 2022-11-04 LAB — CBC WITH DIFFERENTIAL (CANCER CENTER ONLY)
Abs Immature Granulocytes: 0.01 10*3/uL (ref 0.00–0.07)
Basophils Absolute: 0.1 10*3/uL (ref 0.0–0.1)
Basophils Relative: 1 %
Eosinophils Absolute: 0.1 10*3/uL (ref 0.0–0.5)
Eosinophils Relative: 1 %
HCT: 53 % — ABNORMAL HIGH (ref 39.0–52.0)
Hemoglobin: 17.8 g/dL — ABNORMAL HIGH (ref 13.0–17.0)
Immature Granulocytes: 0 %
Lymphocytes Relative: 18 %
Lymphs Abs: 1.2 10*3/uL (ref 0.7–4.0)
MCH: 29.6 pg (ref 26.0–34.0)
MCHC: 33.6 g/dL (ref 30.0–36.0)
MCV: 88 fL (ref 80.0–100.0)
Monocytes Absolute: 0.9 10*3/uL (ref 0.1–1.0)
Monocytes Relative: 13 %
Neutro Abs: 4.6 10*3/uL (ref 1.7–7.7)
Neutrophils Relative %: 67 %
Platelet Count: 184 10*3/uL (ref 150–400)
RBC: 6.02 MIL/uL — ABNORMAL HIGH (ref 4.22–5.81)
RDW: 12.8 % (ref 11.5–15.5)
WBC Count: 6.8 10*3/uL (ref 4.0–10.5)
nRBC: 0 % (ref 0.0–0.2)

## 2022-11-04 LAB — IRON AND IRON BINDING CAPACITY (CC-WL,HP ONLY)
Iron: 45 ug/dL (ref 45–182)
Saturation Ratios: 11 % — ABNORMAL LOW (ref 17.9–39.5)
TIBC: 426 ug/dL (ref 250–450)
UIBC: 381 ug/dL — ABNORMAL HIGH (ref 117–376)

## 2022-11-04 LAB — FERRITIN: Ferritin: 15 ng/mL — ABNORMAL LOW (ref 24–336)

## 2022-11-11 ENCOUNTER — Ambulatory Visit: Payer: 59 | Attending: Physician Assistant | Admitting: Physician Assistant

## 2022-11-11 ENCOUNTER — Encounter: Payer: Self-pay | Admitting: Physician Assistant

## 2022-11-11 VITALS — BP 122/80 | HR 90 | Ht 70.0 in | Wt 199.0 lb

## 2022-11-11 DIAGNOSIS — Z79899 Other long term (current) drug therapy: Secondary | ICD-10-CM

## 2022-11-11 DIAGNOSIS — I2510 Atherosclerotic heart disease of native coronary artery without angina pectoris: Secondary | ICD-10-CM | POA: Diagnosis not present

## 2022-11-11 DIAGNOSIS — E782 Mixed hyperlipidemia: Secondary | ICD-10-CM | POA: Diagnosis not present

## 2022-11-11 DIAGNOSIS — I10 Essential (primary) hypertension: Secondary | ICD-10-CM

## 2022-11-11 MED ORDER — ATORVASTATIN CALCIUM 10 MG PO TABS: 40.0000 mg | ORAL_TABLET | Freq: Every day | ORAL | 3 refills | Status: DC

## 2022-11-11 NOTE — Progress Notes (Signed)
Cardiology Office Note:  .   Date:  11/13/2022  ID:  Juanita Craver, DOB 1961/01/06, MRN [id number] PCP: Nelwyn Salisbury, MD  [facility] Providers Cardiologist:  Reatha Harps, MD     History of Present Illness: .   MARIAH RHEINHEIMER is a 62 y.o. male with PMH of HTN, HLD, polycythemia, OSA and CAD.  Patient was referred to cardiology service for evaluation of coronary calcium at the request of Dr. Clent Ridges.  He recently underwent CT scan for lung nodule follow-up and found to have mild LAD calcification.  He reported strong family history of heart disease in his father.  His father eventually passed away from congestive heart failure.  He also had grandparents with stroke and heart attacks.  Patient had shortness of breath with activity.  Hemoglobin was 18.7.  Dr. Flora Lipps recommended echocardiogram and coronary CT.  Echocardiogram obtained on 09/28/2022 showed EF 60 to 65%, trivial MR, mild AI.  Coronary CT obtained on 09/09/2022 showed stable 2 mm right middle lobe lung nodule, 25 to 49% mid LAD lesion, coronary calcium score of 347 which placed the patient 56 percentile for age and sex matched control.  His Lipitor was increased to 40 mg daily.  Patient presents today for follow-up.  He is due for fasting lipid panel and LFT, he is tolerating the 40 mg daily of Lipitor.  Unfortunately he is not fasting today, I asked him to come back in the next few weeks at any time to get the fasting blood work.  He denies any recent chest pain or shortness of breath.  He is taking aspirin every other day.  He cannot tolerate daily dosing of aspirin given easy bruising and occasional blood in the urine.  We reviewed the recent echocardiogram and a coronary CT.  Patient can follow-up with Dr. Flora Lipps in 6 months.  ROS:   He denies chest pain, palpitations, dyspnea, pnd, orthopnea, n, v, dizziness, syncope, edema, weight gain, or early satiety. All other systems reviewed and are otherwise negative except as noted  above.    Studies Reviewed: .        Cardiac Studies & Procedures       ECHOCARDIOGRAM  ECHOCARDIOGRAM COMPLETE 09/28/2022  Narrative ECHOCARDIOGRAM REPORT    Patient Name:   TONE JEPPERSON Date of Exam: 09/28/2022 Medical Rec #:  [id number]     Height:       70.0 in Accession #:    [id number]    Weight:       197.1 lb Date of Birth:  Apr 05, 1960     BSA:          2.074 m Patient Age:    62 years      BP:           130/92 mmHg Patient Gender: M             HR:           108 bpm. Exam Location:  [address]  Procedure: 2D Echo, Cardiac Doppler, Color Doppler and 3D Echo  Indications:    Dyspnea R06.00  History:        Patient has no prior history of Echocardiogram examinations. Risk Factors:Hypertension and Dyslipidemia.  Sonographer:    Thurman Coyer RDCS Referring Phys: [id number] [facility]  IMPRESSIONS   1. Left ventricular ejection fraction, by estimation, is 60 to 65%. The left ventricle has normal function. The left ventricle has no regional wall motion abnormalities. Left  ventricular diastolic parameters were normal. 2. Right ventricular systolic function is normal. The right ventricular size is normal. 3. Trivial mitral valve regurgitation. 4. MIld aortic valve regurgitation. 5. The inferior vena cava is dilated in size with <50% respiratory variability, suggesting right atrial pressure of 15 mmHg.  FINDINGS Left Ventricle: Left ventricular ejection fraction, by estimation, is 60 to 65%. The left ventricle has normal function. The left ventricle has no regional wall motion abnormalities. The left ventricular internal cavity size was normal in size. There is no left ventricular hypertrophy. Left ventricular diastolic parameters were normal.  Right Ventricle: The right ventricular size is normal. Right vetricular wall thickness was not assessed. Right ventricular systolic function is normal.  Left Atrium: Left atrial size was normal in size.  Right  Atrium: Right atrial size was normal in size.  Pericardium: There is no evidence of pericardial effusion.  Mitral Valve: The mitral valve is normal in structure. Trivial mitral valve regurgitation.  Tricuspid Valve: The tricuspid valve is normal in structure. Tricuspid valve regurgitation is trivial.  Aortic Valve: The aortic valve is tricuspid. Aortic valve regurgitation is mild. Aortic regurgitation PHT measures 304 msec. Aortic valve sclerosis is present, with no evidence of aortic valve stenosis.  Pulmonic Valve: The pulmonic valve was normal in structure. Pulmonic valve regurgitation is mild.  Aorta: The aortic root and ascending aorta are structurally normal, with no evidence of dilitation.  Venous: The inferior vena cava is dilated in size with less than 50% respiratory variability, suggesting right atrial pressure of 15 mmHg.  IAS/Shunts: No atrial level shunt detected by color flow Doppler.   LEFT VENTRICLE PLAX 2D LVIDd:         4.40 cm   Diastology LVIDs:         3.20 cm   LV e' medial:    8.70 cm/s LV PW:         1.10 cm   LV E/e' medial:  7.0 LV IVS:        1.10 cm   LV e' lateral:   13.90 cm/s LVOT diam:     2.40 cm   LV E/e' lateral: 4.4 LV SV:         99 LV SV Index:   48 LVOT Area:     4.52 cm   RIGHT VENTRICLE             IVC RV Basal diam:  3.20 cm     IVC diam: 2.20 cm RV S prime:     14.70 cm/s TAPSE (M-mode): 2.3 cm  LEFT ATRIUM             Index        RIGHT ATRIUM           Index LA diam:        4.40 cm 2.12 cm/m   RA Area:     14.80 cm LA Vol (A2C):   68.9 ml 33.21 ml/m  RA Volume:   32.40 ml  15.62 ml/m LA Vol (A4C):   59.5 ml 28.68 ml/m LA Biplane Vol: 63.9 ml 30.80 ml/m AORTIC VALVE LVOT Vmax:   148.00 cm/s LVOT Vmean:  98.500 cm/s LVOT VTI:    0.218 m AI PHT:      304 msec  AORTA Ao Root diam: 3.70 cm Ao Asc diam:  4.00 cm  MITRAL VALVE MV Area (PHT): 4.21 cm    SHUNTS MV Decel Time: 180 msec    Systemic VTI:  0.22 m  MV E  velocity: 61.30 cm/s  Systemic Diam: 2.40 cm MV A velocity: 88.80 cm/s MV E/A ratio:  0.69  Dietrich Pates MD Electronically signed by Dietrich Pates MD Signature Date/Time: 09/28/2022/5:29:17 PM    Final     CT SCANS  CT CORONARY MORPH W/CTA COR W/SCORE 09/09/2022  Addendum 09/16/2022  2:10 AM ADDENDUM REPORT: 09/16/2022 02:08  EXAM: OVER-READ INTERPRETATION  CT CHEST  The following report is an over-read performed by radiologist Dr. Aram Candela of [facility] on 09/16/2022. This over-read does not include interpretation of cardiac or coronary anatomy or pathology. The coronary calcium score/coronary CTA interpretation by the cardiologist is attached.  COMPARISON:  June 30, 2022  FINDINGS: Cardiovascular: There are no significant extracardiac vascular findings.  Mediastinum/Nodes: There are no enlarged lymph nodes within the visualized mediastinum.  Lungs/Pleura: There is no pleural effusion. A stable 2 mm noncalcified lung nodule is seen within the anterior aspect of the right middle lobe (axial CT image 14, CT series 11).  Upper abdomen: There is diffuse fatty infiltration of the liver parenchyma.  Musculoskeletal/Chest wall: No chest wall mass or suspicious osseous findings within the visualized chest.  IMPRESSION: 1. Stable 2 mm right middle lobe lung nodule. No follow-up needed if patient is low-risk.This recommendation follows the consensus statement: Guidelines for Management of Incidental Pulmonary Nodules Detected on CT Images: From the Fleischner Society 2017; Radiology 2017; 284:228-243. 2. Fatty liver.   Electronically Signed By: Aram Candela M.D. On: 09/16/2022 02:08  Narrative HISTORY: Dyspnea on exertion (DOE), sob, coronary artery calcifications  EXAM: Cardiac/Coronary  CT  TECHNIQUE: The patient was scanned on a [employer].  PROTOCOL: A 120 kV prospective scan was triggered in the descending thoracic aorta  at 111 HU's. Axial non-contrast 3 mm slices were carried out through the heart. The data set was analyzed on a dedicated work station and scored using the Agatston method. Gantry rotation speed was 250 msecs and collimation was .6 mm. Beta blockade and 0.8 mg of sl NTG was given. The 3D data set was reconstructed in 5% intervals of the 35-75 % of the R-R cycle. Systolic and diastolic phases were analyzed on a dedicated work station using MPR, MIP and VRT modes. The patient received contrast: 95mL OMNIPAQUE IOHEXOL 350 MG/ML SOLN.  FINDINGS: Image quality: Good  Noise artifact is: Limited  Coronary calcium score is 239, which places the patient in the 82nd percentile for age and sex matched control.  Coronary arteries: Normal coronary origins.  Right dominance.  Right Coronary Artery: Minimal mixed atherosclerotic plaque in the proximal and mid RCA, <25% stenosis.  Left Main Coronary Artery: Minimal mixed atherosclerotic plaque in the proximal LM, <25% stenosis.  Left Anterior Descending Coronary Artery: Mixed atherosclerotic plaque scattered in proximal LAD with minimal stenosis, <25% stenosis. Mild atherosclerotic plaque in the mid LAD, 25-49% stenosis. Patent diagonal branches with no significant plaque or stenosis.  Ramus intermedius: No detectable plaque or stenosis.  Left Circumflex Artery: No detectable plaque or stenosis.  Aorta:  Upper limit of normal sinus of Valsalva dimensions when indexed to BSA for age (BSA 2.08).  Sinus measurements: R-L: 41 mm, R-Non:38 mm, L-Non: 39 mm. Sinus to sinus technique.  Normal caliber mid ascending aorta, 38 mm at the mid ascending aorta (level of the PA bifurcation) measured double oblique.  Aortic Valve: No calcifications.  Other findings:  Normal pulmonary vein drainage into the left atrium.  Normal left atrial appendage without thrombus.  Mild dilation of  main pulmonary artery, 31 mm.  Please see separate report  from [facility] for non-cardiac findings.  IMPRESSION: 1. Mild CAD in the mid LAD, 25-49% stenosis, CADRADS 2.  2. Total plaque volume 347 mm3 which is 58th percentile for age- and sex-matched controls (calcified plaque 56 mm3; non-calcified plaque 291 mm3). TPV is severe.  3. Coronary calcium score is 239, which places the patient in the 82nd percentile for age and sex matched control.  4. Normal coronary origins with right dominance.  5. Upper limit of normal sinus of Valsalva dimensions when indexed to BSA. 41 x 39 x 38 mm.  6. Mild dilation of main pulmonary artery, 31 mm, may indicate increased pulmonary pressures.  RECOMMENDATIONS: CAD-RADS 2. Mild non-obstructive CAD (25-49%). Consider non-atherosclerotic causes of chest pain. Consider preventive therapy and risk factor modification.  Electronically Signed: By: Weston Brass M.D. On: 09/09/2022 16:00          Risk Assessment/Calculations:            Physical Exam:   VS:  BP 122/80 (BP Location: Left Arm, Patient Position: Sitting, Cuff Size: Normal)   Pulse 90   Ht 5\' 10"  (1.778 m)   Wt 199 lb (90.3 kg)   SpO2 99%   BMI 28.55 kg/m    Wt Readings from Last 3 Encounters:  11/11/22 199 lb (90.3 kg)  09/22/22 197 lb 1.9 oz (89.4 kg)  09/03/22 199 lb (90.3 kg)    GEN: Well nourished, well developed in no acute distress NECK: No JVD; No carotid bruits CARDIAC: RRR, no murmurs, rubs, gallops RESPIRATORY:  Clear to auscultation without rales, wheezing or rhonchi  ABDOMEN: Soft, non-tender, non-distended EXTREMITIES:  No edema; No deformity   ASSESSMENT AND PLAN: .    CAD: Mild mid LAD lesion on recent coronary CT.  Patient denies any significant chest pain  Hypertension: Blood pressure well-controlled on current therapy  Hyperlipidemia: On Lipitor 40 mg daily.       Dispo: Follow-up in 6 months  Signed, Azalee Course, [place]

## 2022-11-11 NOTE — Patient Instructions (Signed)
Medication Instructions:  NO CHANGES *If you need a refill on your cardiac medications before your next appointment, please call your pharmacy*   Lab Work: FASTING LIPIDS AND LFT WITHIN 3 WEEKS If you have labs (blood work) drawn today and your tests are completely normal, you will receive your results only by: MyChart Message (if you have MyChart) OR A paper copy in the mail If you have any lab test that is abnormal or we need to change your treatment, we will call you to review the results.   Testing/Procedures: NO TESTING   Follow-Up: At [facility], you and your health needs are our priority.  As part of our continuing mission to provide you with exceptional heart care, we have created designated Provider Care Teams.  These Care Teams include your primary Cardiologist (physician) and Advanced Practice Providers (APPs -  Physician Assistants and Nurse Practitioners) who all work together to provide you with the care you need, when you need it.    Your next appointment:   6 month(s)  Provider:   Lennie Odor, MD

## 2022-11-13 MED ORDER — ATORVASTATIN CALCIUM 40 MG PO TABS: 40.0000 mg | ORAL_TABLET | Freq: Every day | ORAL | 3 refills | Status: DC

## 2022-11-16 ENCOUNTER — Inpatient Hospital Stay: Payer: 59

## 2022-11-16 DIAGNOSIS — D751 Secondary polycythemia: Secondary | ICD-10-CM | POA: Diagnosis not present

## 2022-11-16 NOTE — Progress Notes (Signed)
Dale [name] presents today for phlebotomy per MD orders. Phlebotomy procedure started at 1046 and ended at 1059. 527 grams removed from rt [facility] using 16g phlebotomy kit.  Patient declined post observation period.  Patient tolerated procedure well. IV needle removed intact.

## 2022-11-16 NOTE — Patient Instructions (Signed)

## 2022-12-03 ENCOUNTER — Encounter: Payer: Self-pay | Admitting: Gastroenterology

## 2022-12-03 ENCOUNTER — Ambulatory Visit: Payer: 59 | Admitting: Gastroenterology

## 2022-12-03 ENCOUNTER — Other Ambulatory Visit: Payer: 59

## 2022-12-03 VITALS — BP 130/68 | HR 111 | Ht 70.0 in | Wt 199.0 lb

## 2022-12-03 DIAGNOSIS — K529 Noninfective gastroenteritis and colitis, unspecified: Secondary | ICD-10-CM | POA: Insufficient documentation

## 2022-12-03 MED ORDER — NA SULFATE-K SULFATE-MG SULF 17.5-3.13-1.6 GM/177ML PO SOLN: 1.0000 | Freq: Once | ORAL | 0 refills | Status: AC

## 2022-12-03 NOTE — Patient Instructions (Addendum)
Your provider has requested that you go to the basement level for lab work before leaving today. Press "B" on the elevator. The lab is located at the first door on the left as you exit the elevator.  You have been scheduled for a colonoscopy. Please follow written instructions given to you at your visit today.   Please pick up your prep supplies at the pharmacy within the next 1-3 days.  If you use inhalers (even only as needed), please bring them with you on the day of your procedure.  DO NOT TAKE 7 DAYS PRIOR TO TEST- Trulicity (dulaglutide) Ozempic, Wegovy (semaglutide) Mounjaro (tirzepatide) Bydureon Bcise (exanatide extended release)  DO NOT TAKE 1 DAY PRIOR TO YOUR TEST Rybelsus (semaglutide) Adlyxin (lixisenatide) Victoza (liraglutide) Byetta (exanatide) ________________________________________________________________  _______________________________________________________  If your blood pressure at your visit was 140/90 or greater, please contact your primary care physician to follow up on this.  _______________________________________________________  If you are age 60 or older, your body mass index should be between 23-30. Your Body mass index is 28.55 kg/m. If this is out of the aforementioned range listed, please consider follow up with your Primary Care Provider.  If you are age 77 or younger, your body mass index should be between 19-25. Your Body mass index is 28.55 kg/m. If this is out of the aformentioned range listed, please consider follow up with your Primary Care Provider.   ________________________________________________________  The [facility] providers would like to encourage you to use [facility] to communicate with providers for non-urgent requests or questions.  Due to long hold times on the telephone, sending your provider a message by [facility] may be a faster and more efficient way to get a response.  Please allow 48 business hours for a response.  Please  remember that this is for non-urgent requests.  _______________________________________________________

## 2022-12-03 NOTE — Progress Notes (Signed)
12/03/2022 Dale [name] [id number] 1960-05-30   HISTORY OF PRESENT ILLNESS: This is a 62 year old male who is a patient of Dr. Lauro Franklin.  He is here today for evaluation of chronic diarrhea/loose stools.  He tells me that he has been having about 2-3 loose stools every day for about the past year.  They do not really have any form or consistency to them.  He denies seeing any blood in his stools.  He denies any abdominal pain.  He tells me that none of his medications are new within the past year or so, although he is on magnesium and allopurinol.  He does not wake up at night to have bowel movements.  Feels like historically he probably has had some lactose intolerance and does not do a lot of dairy, but does use a muscle milk protein powder that he believes is milk-based.  Does not use artificial sweeteners.  Says that he does eat some yogurt and stuff as well.  TSH in November 2023 was normal.  He follows with hematology for erythrocytosis with low iron levels.  He is on iron supplementation, but gets periodic phlebotomies.  Some of this is thought to be due to his testosterone therapy.  Last colonoscopy was in June 2020 which time he had diverticulosis in the sigmoid, descending, and ascending colon.  Had a 4 mm polyp that was removed that was a tubular adenoma.  Repeat recommended in 7 years.  Past Medical History:  Diagnosis Date   Allergic rhinitis, seasonal    Anxiety    Arthritis    back   Depression    Genital herpes    GERD (gastroesophageal reflux disease)    Gross hematuria    Hiatal hernia    History of chest pain    s/p  nuclear stress test, 08-26-2009, normal perfusion without ischemia , ef 58%, stated non-cardiac chest pain   History of kidney stones    History of nonmelanoma skin cancer    excision left leg SCC;  excision of chest BCC    Hyperlipidemia    Hypertension    followed by pcp    Nocturia    OSA (obstructive sleep apnea)    Urethral lesion     prostatic   Wears glasses    Past Surgical History:  Procedure Laterality Date   COLONOSCOPY Bilateral 07/01/2018   per Dr. Rhea Belton, adenomatous polyp, repeat in 7 yrs   CYSTOSCOPY N/A 08/23/2019   Procedure: CYSTOSCOPY WITH TRANSURETHRAL BIOPSY;  Surgeon: Rene Paci, MD;  Location: [facility];  Service: Urology;  Laterality: N/A;   ESOPHAGOGASTRODUODENOSCOPY  05/28/2017   KNEE ARTHROSCOPY Right 01/09/2013   Procedure: ARTHROSCOPY KNEE;  Surgeon: Eldred Manges, MD;  Location: [facility];  Service: Orthopedics;  Laterality: Right;  Right knee scope, partial medial menisectomy   KNEE ARTHROSCOPY Left 2007   KNEE SURGERY Right 1983;  1989;  1990; 2001; last one @ [facility] 06-10-2000   LUMBAR DISC SURGERY  2018   POSTERIOR LUMBAR FUSION  12-21-2011  @[facility]    L4--5   ROTATOR CUFF REPAIR Left 2003   TOTAL KNEE ARTHROPLASTY Right 07/10/2013   Procedure: RIGHT TOTAL KNEE ARTHROPLASTY;  Surgeon: Loanne Drilling, MD;  Location: [facility];  Service: Orthopedics;  Laterality: Right;   VASECTOMY      reports that he has been smoking cigarettes. He has never used smokeless tobacco. He reports current alcohol use. He reports that he does not use drugs. family  history includes Coronary artery disease in his father; Depression in an other family member; Heart attack in his father; Heart disease in his father; Hyperlipidemia in his mother and another family member; Hypertension in his mother and another family member; Lung cancer in an other family member; Stroke in an other family member. Allergies  Allergen Reactions   Lisinopril Cough      Outpatient Encounter Medications as of 12/03/2022  Medication Sig   allopurinol (ZYLOPRIM) 300 MG tablet TAKE 1 TABLET DAILY   amLODipine (NORVASC) 5 MG tablet TAKE 1 TABLET DAILY   anastrozole (ARIMIDEX) 1 MG tablet Take 0.5 mg by mouth once a week.   aspirin EC 81 MG tablet Take 81 mg by mouth every other day. Swallow whole.   atorvastatin  (LIPITOR) 40 MG tablet Take 1 tablet (40 mg total) by mouth daily.   human chorionic gonadotropin (PREGNYL) 95621 units injection 0.4 ml ( 400 iu) Sub Q on day 3 and day 5 post testosterone injection   hydrocortisone cream 1 % Apply 1 application. topically as needed for itching.   Iron, Ferrous Sulfate, 325 (65 Fe) MG TABS Take 1 tablet my mouth daily.   JUBLIA 10 % SOLN Apply topically daily.   LORazepam (ATIVAN) 2 MG tablet TAKE 1 TABLET BY MOUTH EVERY 6 HOURS AS NEEDED   MAGNESIUM GLYCINATE PO Take by mouth.   Misc Natural Products (GLUCOSAMINE CHONDROITIN TRIPLE PO) Take by mouth daily. With Tumeric   Multiple Vitamin (ONE-A-DAY MENS PO) Take 1 capsule by mouth daily.   omeprazole (PRILOSEC) 20 MG capsule Take 20 mg by mouth daily.   sildenafil (VIAGRA) 100 MG tablet TAKE 1 TABLET BY MOUTH DAILY AS NEEDED FOR ERECTILE DYSFUNCTION   testosterone cypionate (DEPOTESTOTERONE CYPIONATE) 100 MG/ML injection Inject into the muscle once a week. For IM use only- Takes 0.7 ml/ weekly.   valACYclovir (VALTREX) 500 MG tablet TAKE 1 TABLET DAILY   vitamin B-12 (CYANOCOBALAMIN) 1000 MCG tablet Take 1,000 mcg by mouth once a week.   Vitamin D-Vitamin K (VITAMIN K2-VITAMIN D3 PO) Take by mouth daily.   No facility-administered encounter medications on file as of 12/03/2022.    REVIEW OF SYSTEMS  : All other systems reviewed and negative except where noted in the History of Present Illness.   PHYSICAL EXAM: BP 130/68   Pulse (!) 111   Ht 5\' 10"  (1.778 m)   Wt 199 lb (90.3 kg)   SpO2 95%   BMI 28.55 kg/m  General: Well developed white male in no acute distress Head: Normocephalic and atraumatic Eyes:  Sclerae anicteric, conjunctiva pink. Ears: Normal auditory acuity Lungs: Clear throughout to auscultation; no W/R/R. Heart: Tachycardic but regular rhythm; no M/R/G. Abdomen: Soft, non-distended.  BS present.  Non-tender. Rectal:  Will be done at the time of colonoscopy. Musculoskeletal:  Symmetrical with no gross deformities  Skin: No lesions on visible extremities Neurological: Alert oriented x 4, grossly non-focal Psychological:  Alert and cooperative. Normal mood and affect  ASSESSMENT AND PLAN: *Chronic diarrhea: Describes having 2-3 bowel movements a day, but are always loose in consistency, no solid stools.  He says this has been going on probably for about the past year.  None of his medications are new within the past year or so, although allopurinol and magnesium can cause loose stools.  Feels like historically he probably has had some lactose intolerance and does not do a lot of dairy, but does use a muscle milk protein powder that he believes is  milk-based.  Says that he does eat some yogurt and stuff as well.  Will check celiac labs.  Will check a C. difficile, fecal calprotectin, pancreatic fecal elastase.  Will schedule for colonoscopy as I think that will be the next step if all these other things come back normal and then if colonoscopy is normal may need to focus more on dietary changes.  Schedule with Dr. Rhea Belton.  The risks, benefits, and alternatives to colonoscopy were discussed with the patient and he consents to proceed.   CC:  Nelwyn Salisbury, MD

## 2022-12-04 ENCOUNTER — Other Ambulatory Visit: Payer: 59

## 2022-12-04 DIAGNOSIS — K529 Noninfective gastroenteritis and colitis, unspecified: Secondary | ICD-10-CM

## 2022-12-04 LAB — TISSUE TRANSGLUTAMINASE ABS,IGG,IGA
(tTG) Ab, IgA: <1 U/mL
(tTG) Ab, IgG: <1 U/mL

## 2022-12-04 LAB — IGA: Immunoglobulin A: 263 mg/dL (ref 47–310)

## 2022-12-07 LAB — CLOSTRIDIUM DIFFICILE TOXIN B, QUALITATIVE, REAL-TIME PCR: Toxigenic C. Difficile by PCR: NOT DETECTED

## 2022-12-08 LAB — CALPROTECTIN, FECAL: Calprotectin, Fecal: 118 ug/g (ref 0–120)

## 2022-12-09 ENCOUNTER — Other Ambulatory Visit: Payer: Self-pay | Admitting: Family Medicine

## 2022-12-11 NOTE — Progress Notes (Signed)
Addendum: Reviewed and agree with assessment and management plan. Kadijah Shamoon M, MD  

## 2022-12-12 LAB — PANCREATIC ELASTASE, FECAL: Pancreatic Elastase-1, Stool: >800 ug/g (ref >200)

## 2022-12-14 ENCOUNTER — Ambulatory Visit: Payer: 59

## 2022-12-22 ENCOUNTER — Encounter: Payer: Self-pay | Admitting: Medical Oncology

## 2022-12-22 ENCOUNTER — Inpatient Hospital Stay: Payer: 59 | Attending: Medical Oncology | Admitting: Medical Oncology

## 2022-12-22 ENCOUNTER — Inpatient Hospital Stay: Payer: 59

## 2022-12-22 VITALS — BP 142/84 | HR 99 | Temp 98.2°F | Resp 18 | Ht 70.0 in | Wt 201.0 lb

## 2022-12-22 DIAGNOSIS — D751 Secondary polycythemia: Secondary | ICD-10-CM | POA: Insufficient documentation

## 2022-12-22 DIAGNOSIS — Z7982 Long term (current) use of aspirin: Secondary | ICD-10-CM | POA: Diagnosis not present

## 2022-12-22 DIAGNOSIS — D509 Iron deficiency anemia, unspecified: Secondary | ICD-10-CM

## 2022-12-22 DIAGNOSIS — G4730 Sleep apnea, unspecified: Secondary | ICD-10-CM | POA: Diagnosis not present

## 2022-12-22 DIAGNOSIS — D508 Other iron deficiency anemias: Secondary | ICD-10-CM | POA: Diagnosis present

## 2022-12-22 LAB — FERRITIN: Ferritin: 26 ng/mL (ref 24–336)

## 2022-12-22 LAB — CMP (CANCER CENTER ONLY)
ALT: 44 U/L (ref 0–44)
AST: 33 U/L (ref 15–41)
Albumin: 4.4 g/dL (ref 3.5–5.0)
Alkaline Phosphatase: 78 U/L (ref 38–126)
Anion gap: 9 (ref 5–15)
BUN: 12 mg/dL (ref 6–20)
CO2: 27 mmol/L (ref 22–32)
Calcium: 9.5 mg/dL (ref 8.9–10.3)
Chloride: 103 mmol/L (ref 98–111)
Creatinine: 0.88 mg/dL (ref 0.61–1.24)
GFR, Estimated: >60 mL/min (ref >60)
Glucose, Bld: 128 mg/dL — ABNORMAL HIGH (ref 70–99)
Potassium: 4 mmol/L (ref 3.5–5.1)
Sodium: 139 mmol/L (ref 135–145)
Total Bilirubin: 2.1 mg/dL — ABNORMAL HIGH (ref <1.2)
Total Protein: 7.3 g/dL (ref 6.5–8.1)

## 2022-12-22 LAB — CBC WITH DIFFERENTIAL (CANCER CENTER ONLY)
Abs Immature Granulocytes: 0.01 10*3/uL (ref 0.00–0.07)
Basophils Absolute: 0.1 10*3/uL (ref 0.0–0.1)
Basophils Relative: 2 %
Eosinophils Absolute: 0.2 10*3/uL (ref 0.0–0.5)
Eosinophils Relative: 3 %
HCT: 53.9 % — ABNORMAL HIGH (ref 39.0–52.0)
Hemoglobin: 18 g/dL — ABNORMAL HIGH (ref 13.0–17.0)
Immature Granulocytes: 0 %
Lymphocytes Relative: 27 %
Lymphs Abs: 1.4 10*3/uL (ref 0.7–4.0)
MCH: 29.2 pg (ref 26.0–34.0)
MCHC: 33.4 g/dL (ref 30.0–36.0)
MCV: 87.5 fL (ref 80.0–100.0)
Monocytes Absolute: 0.8 10*3/uL (ref 0.1–1.0)
Monocytes Relative: 16 %
Neutro Abs: 2.8 10*3/uL (ref 1.7–7.7)
Neutrophils Relative %: 52 %
Platelet Count: 180 10*3/uL (ref 150–400)
RBC: 6.16 MIL/uL — ABNORMAL HIGH (ref 4.22–5.81)
RDW: 14.6 % (ref 11.5–15.5)
WBC Count: 5.3 10*3/uL (ref 4.0–10.5)
nRBC: 0 % (ref 0.0–0.2)

## 2022-12-22 LAB — IRON AND IRON BINDING CAPACITY (CC-WL,HP ONLY)
Iron: 225 ug/dL — ABNORMAL HIGH (ref 45–182)
Saturation Ratios: 53 % — ABNORMAL HIGH (ref 17.9–39.5)
TIBC: 424 ug/dL (ref 250–450)
UIBC: 199 ug/dL (ref 117–376)

## 2022-12-22 NOTE — Progress Notes (Signed)
Hematology and Oncology Follow Up Visit  CLOID VOSSLER [id number] January 17, 1961 62 y.o. 12/22/2022   Principle Diagnosis:  Iron deficiency anemia after frequent phlebotomy for elevated Hgb with testosterone use   Current Therapy:        IV iron as indicated in past now on oral iron every other day Now observation and phlebotomy PRN for elevated Hgb   Interim History:  Mr. Stavros is here today for follow-up.   He reports that he is doing well. He is currently taking oral iron to help with fatigue associated with his iron deficiency secondary to aggressive phlebotomies.   He is on 0.5 of his testosterone injections. He is hesitant to reduce further.  He does not smoke.  He does take an 81 mg asa daily  We go back and forth with iron deficiency vs erythrocytosis with oral iron and phlebotomy when needed.   He continues his weekly testosterone injection which has helped his quality of life.  No abnormal blood loss, bruising or petechiae.  He has an upcoming colonoscopy in January He has untreated sleep apnea which is likely also a contributing factor to his erythrocytosis. He is considering following up with the sleep clinic to see what other mask options there are.  No fever, chills, n/v, cough, rash, dizziness, SOB, chest pain, palpitations, abdominal pain or changes in bladder habits at this time.  He notes several episodes of diarrhea in the mornings. He does drink coffee at that time. He states that he noted improvement with taking a probiotic in the past and will try this again.  No swelling in his extremities.  No falls or syncope reported.  Appetite and hydration are good.   ECOG Performance Status: 1 - Symptomatic but completely ambulatory  Medications:  Allergies as of 12/22/2022       Reactions   Lisinopril Cough        Medication List        Accurate as of December 22, 2022  2:55 PM. If you have any questions, ask your nurse or doctor.          allopurinol 300  MG tablet Commonly known as: ZYLOPRIM TAKE 1 TABLET DAILY   amLODipine 5 MG tablet Commonly known as: NORVASC TAKE 1 TABLET DAILY   anastrozole 1 MG tablet Commonly known as: ARIMIDEX Take 0.5 mg by mouth once a week.   aspirin EC 81 MG tablet Take 81 mg by mouth every other day. Swallow whole.   atorvastatin 40 MG tablet Commonly known as: LIPITOR Take 1 tablet (40 mg total) by mouth daily.   cyanocobalamin 1000 MCG tablet Commonly known as: VITAMIN B12 Take 1,000 mcg by mouth once a week.   GLUCOSAMINE CHONDROITIN TRIPLE PO Take by mouth daily. With Tumeric   hydrocortisone cream 1 % Apply 1 application. topically as needed for itching.   Iron (Ferrous Sulfate) 325 (65 Fe) MG Tabs Take 1 tablet my mouth daily.   Jublia 10 % Soln Generic drug: Efinaconazole Apply topically daily.   LORazepam 2 MG tablet Commonly known as: ATIVAN TAKE 1 TABLET BY MOUTH EVERY 6 HOURS AS NEEDED   MAGNESIUM GLYCINATE PO Take by mouth.   Na Sulfate-K Sulfate-Mg Sulf 17.5-3.13-1.6 GM/177ML Soln Take by mouth See admin instructions.   omeprazole 20 MG capsule Commonly known as: PRILOSEC Take 20 mg by mouth daily.   ONE-A-DAY MENS PO Take 1 capsule by mouth daily.   Pregnyl 40981 units injection Generic drug: human chorionic gonadotropin 0.4 ml (  400 iu) Sub Q on day 3 and day 5 post testosterone injection   sildenafil 100 MG tablet Commonly known as: VIAGRA TAKE 1 TABLET BY MOUTH DAILY AS NEEDED FOR ERECTILE DYSFUNCTION   testosterone cypionate 100 MG/ML injection Commonly known as: DEPOTESTOTERONE CYPIONATE Inject into the muscle once a week. For IM use only- Takes 0.7 ml/ weekly.   valACYclovir 500 MG tablet Commonly known as: VALTREX TAKE 1 TABLET DAILY   VITAMIN K2-VITAMIN D3 PO Take by mouth daily.        Allergies:  Allergies  Allergen Reactions   Lisinopril Cough    Past Medical History, Surgical history, Social history, and Family History were  reviewed and updated.  Review of Systems: All other 10 point review of systems is negative.   Physical Exam:  height is 5\' 10"  (1.778 m) and weight is 201 lb (91.2 kg). His oral temperature is 98.2 F (36.8 C). His blood pressure is 142/84 (abnormal) and his pulse is 99. His respiration is 18 and oxygen saturation is 98%.   Wt Readings from Last 3 Encounters:  12/22/22 201 lb (91.2 kg)  12/03/22 199 lb (90.3 kg)  11/11/22 199 lb (90.3 kg)    Ocular: Sclerae unicteric, pupils equal, round and reactive to light Ear-nose-throat: Oropharynx clear, dentition fair Lymphatic: No cervical or supraclavicular adenopathy Lungs no rales or rhonchi, good excursion bilaterally Heart regular rate and rhythm, no murmur appreciated Abd soft, nontender, positive bowel sounds MSK no focal spinal tenderness, no joint edema Neuro: non-focal, well-oriented, appropriate affect  Lab Results  Component Value Date   WBC 5.3 12/22/2022   HGB 18.0 (H) 12/22/2022   HCT 53.9 (H) 12/22/2022   MCV 87.5 12/22/2022   PLT 180 12/22/2022   Lab Results  Component Value Date   FERRITIN 15 (L) 11/04/2022   IRON 45 11/04/2022   TIBC 426 11/04/2022   UIBC 381 (H) 11/04/2022   IRONPCTSAT 11 (L) 11/04/2022   Lab Results  Component Value Date   RETICCTPCT 2.3 08/24/2022   RBC 6.16 (H) 12/22/2022   No results found for: "KPAFRELGTCHN", "LAMBDASER", "KAPLAMBRATIO" No results found for: "IGGSERUM", "IGA", "IGMSERUM" No results found for: "TOTALPROTELP", "ALBUMINELP", "A1GS", "A2GS", "BETS", "BETA2SER", "GAMS", "MSPIKE", "SPEI"   Chemistry      Component Value Date/Time   NA 139 12/22/2022 1124   NA 141 09/03/2022 1404   K 4.0 12/22/2022 1124   CL 103 12/22/2022 1124   CO2 27 12/22/2022 1124   BUN 12 12/22/2022 1124   BUN 16 09/03/2022 1404   CREATININE 0.88 12/22/2022 1124   CREATININE 0.97 12/03/2021 1139      Component Value Date/Time   CALCIUM 9.5 12/22/2022 1124   ALKPHOS 78 12/22/2022 1124    AST 33 12/22/2022 1124   ALT 44 12/22/2022 1124   BILITOT 2.1 (H) 12/22/2022 1124     Encounter Diagnoses  Name Primary?   Iron deficiency anemia, unspecified iron deficiency anemia type Yes   Secondary erythrocytosis     Impression and Plan: Mr. Weideman is a 62 yo gentleman with history of iron deficiency anemia after agressive phlebotomizing at an outside office as well as likely malabsorption. He also has had erythrocytosis secondary to testosterone use and suspected sleep apnea.   I have encouraged a phlebotomy today given his Hgb and HCT levels. He declines phlebotomy at this time Sleep study and dose reduction of his testosterone encouraged  Iron studies pending.  Encouraged him to continue his asa as well as hydration  with water.   No phlebotomy today per patient request  RTC 6 weeks APP, labs(CBC, CMP, iron, ferritin), +- phlebotomy   [name], PA-C 12/3/20242:55 PM

## 2022-12-25 ENCOUNTER — Inpatient Hospital Stay: Payer: 59

## 2022-12-25 ENCOUNTER — Other Ambulatory Visit: Payer: Self-pay | Admitting: Oncology

## 2022-12-25 VITALS — BP 133/75 | HR 96 | Resp 18

## 2022-12-25 DIAGNOSIS — D751 Secondary polycythemia: Secondary | ICD-10-CM

## 2022-12-25 DIAGNOSIS — D509 Iron deficiency anemia, unspecified: Secondary | ICD-10-CM

## 2022-12-25 NOTE — Patient Instructions (Signed)

## 2022-12-25 NOTE — Progress Notes (Signed)
Dale [name] presents today for phlebotomy per MD orders. Phlebotomy procedure started at 1215, ended at 1225 and it was performed by Shellee Milo, RN. 570 cc removed via 16 G needle at [facility] site. Patient tolerated procedure well. Patient refused to wait 30 minutes post phlebotomy. Released stable and ASX.

## 2022-12-29 ENCOUNTER — Other Ambulatory Visit: Payer: Self-pay

## 2022-12-29 ENCOUNTER — Telehealth: Payer: Self-pay | Admitting: Gastroenterology

## 2022-12-29 DIAGNOSIS — K219 Gastro-esophageal reflux disease without esophagitis: Secondary | ICD-10-CM

## 2022-12-29 NOTE — Telephone Encounter (Signed)
Dale [name] the pt would like to have an EGD added to colon per the request of PCP for IDA.  Ok to add? He will need to be moved due to length of procedure

## 2022-12-29 NOTE — Telephone Encounter (Signed)
The pt has been advised and states he will have the MD fax over a request if they still would like to have the EGD completed.

## 2022-12-29 NOTE — Progress Notes (Signed)
Patient called requesting [place] to send the request for his EGD to GI to be added to his colonoscopy. Referral sent per Melody Haver.

## 2022-12-29 NOTE — Telephone Encounter (Signed)
PT wants to add an EGD to colonoscopy in 1/27. His hematologist feels it is necessary. Please advise.

## 2023-01-08 ENCOUNTER — Other Ambulatory Visit: Payer: Self-pay | Admitting: Family Medicine

## 2023-01-08 DIAGNOSIS — M1A0620 Idiopathic chronic gout, left knee, without tophus (tophi): Secondary | ICD-10-CM

## 2023-01-19 NOTE — Telephone Encounter (Signed)
Patient calling in regards to previous note. Please advise.

## 2023-01-21 NOTE — Telephone Encounter (Signed)
 Left message on machine to call back

## 2023-01-21 NOTE — Telephone Encounter (Signed)
 Heather see message from Shanda Bumps pt needs to have EGD added to the colon that was set up while in the office.  Thank you

## 2023-01-21 NOTE — Telephone Encounter (Signed)
 Can I add this EGD to patient colonoscopy on 02/15/23. This is will an overbook for you. Please advise.

## 2023-01-21 NOTE — Telephone Encounter (Signed)
 Dale [name] have you seen any notes about pt having EGD per hematologist. The pt is calling to discuss.

## 2023-01-22 ENCOUNTER — Other Ambulatory Visit: Payer: Self-pay | Admitting: *Deleted

## 2023-01-22 DIAGNOSIS — D509 Iron deficiency anemia, unspecified: Secondary | ICD-10-CM

## 2023-01-22 DIAGNOSIS — K529 Noninfective gastroenteritis and colitis, unspecified: Secondary | ICD-10-CM

## 2023-01-22 NOTE — Telephone Encounter (Signed)
 Ok to add, will overbook that afternoon, but I will notify [facility] Indication: IDA JMP

## 2023-01-22 NOTE — Telephone Encounter (Signed)
 EGD added and new ambulatory referral sent.

## 2023-02-03 ENCOUNTER — Inpatient Hospital Stay: Payer: 59 | Attending: Medical Oncology

## 2023-02-03 ENCOUNTER — Inpatient Hospital Stay (HOSPITAL_BASED_OUTPATIENT_CLINIC_OR_DEPARTMENT_OTHER): Payer: 59 | Admitting: Medical Oncology

## 2023-02-03 ENCOUNTER — Inpatient Hospital Stay: Payer: 59

## 2023-02-03 VITALS — BP 150/83 | HR 94 | Temp 98.1°F | Ht 70.0 in | Wt 201.0 lb

## 2023-02-03 DIAGNOSIS — D751 Secondary polycythemia: Secondary | ICD-10-CM | POA: Insufficient documentation

## 2023-02-03 DIAGNOSIS — Z79890 Hormone replacement therapy (postmenopausal): Secondary | ICD-10-CM | POA: Insufficient documentation

## 2023-02-03 DIAGNOSIS — D508 Other iron deficiency anemias: Secondary | ICD-10-CM | POA: Diagnosis present

## 2023-02-03 DIAGNOSIS — D509 Iron deficiency anemia, unspecified: Secondary | ICD-10-CM

## 2023-02-03 LAB — FERRITIN: Ferritin: 23 ng/mL — ABNORMAL LOW (ref 24–336)

## 2023-02-03 LAB — CBC
HCT: 54 % — ABNORMAL HIGH (ref 39.0–52.0)
Hemoglobin: 18.1 g/dL — ABNORMAL HIGH (ref 13.0–17.0)
MCH: 28.7 pg (ref 26.0–34.0)
MCHC: 33.5 g/dL (ref 30.0–36.0)
MCV: 85.6 fL (ref 80.0–100.0)
Platelets: 190 10*3/uL (ref 150–400)
RBC: 6.31 MIL/uL — ABNORMAL HIGH (ref 4.22–5.81)
RDW: 14.6 % (ref 11.5–15.5)
WBC: 6.9 10*3/uL (ref 4.0–10.5)
nRBC: 0 % (ref 0.0–0.2)

## 2023-02-03 LAB — CMP (CANCER CENTER ONLY)
ALT: 40 U/L (ref 0–44)
AST: 33 U/L (ref 15–41)
Albumin: 4.7 g/dL (ref 3.5–5.0)
Alkaline Phosphatase: 82 U/L (ref 38–126)
Anion gap: 10 (ref 5–15)
BUN: 18 mg/dL (ref 6–20)
CO2: 24 mmol/L (ref 22–32)
Calcium: 9.7 mg/dL (ref 8.9–10.3)
Chloride: 104 mmol/L (ref 98–111)
Creatinine: 0.92 mg/dL (ref 0.61–1.24)
GFR, Estimated: >60 mL/min (ref >60)
Glucose, Bld: 116 mg/dL — ABNORMAL HIGH (ref 70–99)
Potassium: 4 mmol/L (ref 3.5–5.1)
Sodium: 138 mmol/L (ref 135–145)
Total Bilirubin: 1.6 mg/dL — ABNORMAL HIGH (ref 0.0–1.2)
Total Protein: 7.4 g/dL (ref 6.5–8.1)

## 2023-02-03 LAB — IRON AND IRON BINDING CAPACITY (CC-WL,HP ONLY)
Iron: 51 ug/dL (ref 45–182)
Saturation Ratios: 11 % — ABNORMAL LOW (ref 17.9–39.5)
TIBC: 449 ug/dL (ref 250–450)
UIBC: 398 ug/dL — ABNORMAL HIGH (ref 117–376)

## 2023-02-03 NOTE — Progress Notes (Signed)
 Hematology and Oncology Follow Up Visit  Dale [name] [id number] 12/03/1960 62 y.o. 02/03/2023   Principle Diagnosis:  Iron  deficiency anemia after frequent phlebotomy for elevated Hgb with testosterone  use   Current Therapy:        IV iron  as indicated in past now on oral iron  every other day Now observation and phlebotomy PRN for elevated Hgb   Interim History:  Dale [name] is here today for follow-up.   He reports that he is doing well.  His last phlebotomy was on 12/25/2022. He is on 0.5 of his testosterone  injections. He is still hesitant to reduce further.  He does not smoke.  He does take an 81 mg asa daily  We go back and forth with iron  deficiency vs erythrocytosis with oral iron  and phlebotomy when needed.   He continues his weekly testosterone  injection which has helped his quality of life.  No abnormal blood loss, bruising or petechiae.  He has an upcoming colonoscopy/endoscopy in Feb (03/17/2023) He has untreated sleep apnea- he is now wearing a mouth device which helps some but he does not wear it every night.  No fever, chills, n/v, cough, rash, dizziness, SOB, chest pain, palpitations, abdominal pain or changes in bladder habits at this time.  No swelling in his extremities.  No falls or syncope reported.  Appetite and hydration are good.  Wt Readings from Last 3 Encounters:  02/03/23 201 lb (91.2 kg)  12/22/22 201 lb (91.2 kg)  12/03/22 199 lb (90.3 kg)   ECOG Performance Status: 1 - Symptomatic but completely ambulatory  Medications:  Allergies as of 02/03/2023       Reactions   Lisinopril  Cough        Medication List        Accurate as of February 03, 2023 12:09 PM. If you have any questions, ask your nurse or doctor.          allopurinol  300 MG tablet Commonly known as: ZYLOPRIM  TAKE 1 TABLET DAILY   amLODipine  5 MG tablet Commonly known as: NORVASC  TAKE 1 TABLET DAILY   anastrozole 1 MG tablet Commonly known as: ARIMIDEX Take 0.5 mg  by mouth once a week.   aspirin EC 81 MG tablet Take 81 mg by mouth every other day. Swallow whole.   atorvastatin  40 MG tablet Commonly known as: LIPITOR Take 1 tablet (40 mg total) by mouth daily.   cyanocobalamin 1000 MCG tablet Commonly known as: VITAMIN B12 Take 1,000 mcg by mouth once a week.   GLUCOSAMINE CHONDROITIN TRIPLE PO Take by mouth daily. With Tumeric   hydrocortisone cream 1 % Apply 1 application. topically as needed for itching.   Iron  (Ferrous Sulfate ) 325 (65 Fe) MG Tabs Take 1 tablet my mouth daily.   Jublia 10 % Soln Generic drug: Efinaconazole Apply topically daily.   LORazepam  2 MG tablet Commonly known as: ATIVAN  TAKE 1 TABLET BY MOUTH EVERY 6 HOURS AS NEEDED   MAGNESIUM GLYCINATE PO Take by mouth.   Na Sulfate-K Sulfate-Mg Sulfate concentrate 17.5-3.13-1.6 GM/177ML Soln Take by mouth See admin instructions.   omeprazole  20 MG capsule Commonly known as: PRILOSEC Take 20 mg by mouth daily.   ONE-A-DAY MENS PO Take 1 capsule by mouth daily.   Pregnyl 10000 units injection Generic drug: human chorionic gonadotropin 0.4 ml ( 400 iu) Sub Q on day 3 and day 5 post testosterone  injection   sildenafil  100 MG tablet Commonly known as: VIAGRA  TAKE 1 TABLET BY MOUTH DAILY AS  NEEDED FOR ERECTILE DYSFUNCTION   testosterone  cypionate 100 MG/ML injection Commonly known as: DEPOTESTOTERONE CYPIONATE Inject into the muscle once a week. For IM use only- Takes 0.7 ml/ weekly.   valACYclovir  500 MG tablet Commonly known as: VALTREX  TAKE 1 TABLET DAILY   VITAMIN K2-VITAMIN D3 PO Take by mouth daily.        Allergies:  Allergies  Allergen Reactions   Lisinopril  Cough    Past Medical History, Surgical history, Social history, and Family History were reviewed and updated.  Review of Systems: All other 10 point review of systems is negative.   Physical Exam:  height is 5\' 10"  (1.778 m) and weight is 201 lb (91.2 kg). His oral temperature  is 98.1 F (36.7 C). His blood pressure is 150/83 (abnormal) and his pulse is 94. His oxygen saturation is 100%.   Wt Readings from Last 3 Encounters:  02/03/23 201 lb (91.2 kg)  12/22/22 201 lb (91.2 kg)  12/03/22 199 lb (90.3 kg)   Ocular: Sclerae unicteric, pupils equal, round and reactive to light Ear-nose-throat: Oropharynx clear, dentition fair Lymphatic: No cervical or supraclavicular adenopathy Lungs no rales or rhonchi, good excursion bilaterally Heart regular rate and rhythm, no murmur appreciated Abd soft, nontender, positive bowel sounds MSK no focal spinal tenderness, no joint edema Neuro: non-focal, well-oriented, appropriate affect  Lab Results  Component Value Date   WBC 5.3 12/22/2022   HGB 18.0 (H) 12/22/2022   HCT 53.9 (H) 12/22/2022   MCV 87.5 12/22/2022   PLT 180 12/22/2022   Lab Results  Component Value Date   FERRITIN 26 12/22/2022   IRON  225 (H) 12/22/2022   TIBC 424 12/22/2022   UIBC 199 12/22/2022   IRONPCTSAT 53 (H) 12/22/2022   Lab Results  Component Value Date   RETICCTPCT 2.3 08/24/2022   RBC 6.16 (H) 12/22/2022   No results found for: "KPAFRELGTCHN", "LAMBDASER", "KAPLAMBRATIO" No results found for: "IGGSERUM", "IGA", "IGMSERUM" No results found for: "TOTALPROTELP", "ALBUMINELP", "A1GS", "A2GS", "BETS", "BETA2SER", "GAMS", "MSPIKE", "SPEI"   Chemistry      Component Value Date/Time   NA 139 12/22/2022 1124   NA 141 09/03/2022 1404   K 4.0 12/22/2022 1124   CL 103 12/22/2022 1124   CO2 27 12/22/2022 1124   BUN 12 12/22/2022 1124   BUN 16 09/03/2022 1404   CREATININE 0.88 12/22/2022 1124   CREATININE 0.97 12/03/2021 1139      Component Value Date/Time   CALCIUM  9.5 12/22/2022 1124   ALKPHOS 78 12/22/2022 1124   AST 33 12/22/2022 1124   ALT 44 12/22/2022 1124   BILITOT 2.1 (H) 12/22/2022 1124     Encounter Diagnoses  Name Primary?   Secondary erythrocytosis Yes   Iron  deficiency anemia, unspecified iron  deficiency anemia  type     Impression and Plan: Dale [name] is a 63 yo gentleman with history of iron  deficiency anemia after agressive phlebotomizing at an outside office as well as likely malabsorption. He also has had erythrocytosis secondary to testosterone  use and untreated sleep apnea.   Today Hgb is 18.1, HCT is 54 Dose reduction of his testosterone  encouraged  Hydration with water and continuation of his ASA Phlebotomy is needed today Encouraged him to wear his sleep apnea oral device to help as well Iron  studies pending.   RTC 4 weeks APP, labs(CBC, CMP, iron , ferritin), +- phlebotomy   Sharla Davis, PA-C 1/15/202512:09 PM

## 2023-02-03 NOTE — Progress Notes (Signed)
 Dale [name] presents today for phlebotomy per MD orders. Phlebotomy procedure started at 1225 and ended at 1234. 548 grams removed. Patient observed for 30 minutes after procedure without any incident. Patient tolerated procedure well. IV needle removed intact. Patient opted not to wait 30 minutes post phlebotomy

## 2023-02-03 NOTE — Patient Instructions (Signed)

## 2023-02-07 ENCOUNTER — Other Ambulatory Visit: Payer: Self-pay | Admitting: Family Medicine

## 2023-02-15 ENCOUNTER — Encounter: Payer: 59 | Admitting: Internal Medicine

## 2023-03-03 ENCOUNTER — Inpatient Hospital Stay: Payer: 59

## 2023-03-03 ENCOUNTER — Encounter: Payer: Self-pay | Admitting: Medical Oncology

## 2023-03-03 ENCOUNTER — Inpatient Hospital Stay: Payer: 59 | Attending: Medical Oncology

## 2023-03-03 ENCOUNTER — Inpatient Hospital Stay: Payer: 59 | Admitting: Medical Oncology

## 2023-03-03 VITALS — BP 134/86 | HR 84 | Resp 17

## 2023-03-03 VITALS — BP 125/95 | HR 91 | Temp 97.8°F | Resp 19 | Ht 70.0 in | Wt 199.1 lb

## 2023-03-03 DIAGNOSIS — D509 Iron deficiency anemia, unspecified: Secondary | ICD-10-CM

## 2023-03-03 DIAGNOSIS — D508 Other iron deficiency anemias: Secondary | ICD-10-CM | POA: Diagnosis present

## 2023-03-03 DIAGNOSIS — Z79890 Hormone replacement therapy (postmenopausal): Secondary | ICD-10-CM | POA: Insufficient documentation

## 2023-03-03 DIAGNOSIS — G4730 Sleep apnea, unspecified: Secondary | ICD-10-CM | POA: Insufficient documentation

## 2023-03-03 DIAGNOSIS — D751 Secondary polycythemia: Secondary | ICD-10-CM

## 2023-03-03 DIAGNOSIS — E291 Testicular hypofunction: Secondary | ICD-10-CM | POA: Diagnosis not present

## 2023-03-03 LAB — CBC
HCT: 55.3 % — ABNORMAL HIGH (ref 39.0–52.0)
Hemoglobin: 18.6 g/dL — ABNORMAL HIGH (ref 13.0–17.0)
MCH: 29.2 pg (ref 26.0–34.0)
MCHC: 33.6 g/dL (ref 30.0–36.0)
MCV: 86.7 fL (ref 80.0–100.0)
Platelets: 189 10*3/uL (ref 150–400)
RBC: 6.38 MIL/uL — ABNORMAL HIGH (ref 4.22–5.81)
RDW: 15 % (ref 11.5–15.5)
WBC: 5.1 10*3/uL (ref 4.0–10.5)
nRBC: 0 % (ref 0.0–0.2)

## 2023-03-03 LAB — CMP (CANCER CENTER ONLY)
ALT: 42 U/L (ref 0–44)
AST: 34 U/L (ref 15–41)
Albumin: 4.7 g/dL (ref 3.5–5.0)
Alkaline Phosphatase: 77 U/L (ref 38–126)
Anion gap: 9 (ref 5–15)
BUN: 15 mg/dL (ref 8–23)
CO2: 26 mmol/L (ref 22–32)
Calcium: 10 mg/dL (ref 8.9–10.3)
Chloride: 104 mmol/L (ref 98–111)
Creatinine: 0.88 mg/dL (ref 0.61–1.24)
GFR, Estimated: >60 mL/min (ref >60)
Glucose, Bld: 118 mg/dL — ABNORMAL HIGH (ref 70–99)
Potassium: 4.2 mmol/L (ref 3.5–5.1)
Sodium: 139 mmol/L (ref 135–145)
Total Bilirubin: 1.9 mg/dL — ABNORMAL HIGH (ref 0.0–1.2)
Total Protein: 7.4 g/dL (ref 6.5–8.1)

## 2023-03-03 LAB — IRON AND IRON BINDING CAPACITY (CC-WL,HP ONLY)
Iron: 70 ug/dL (ref 45–182)
Saturation Ratios: 15 % — ABNORMAL LOW (ref 17.9–39.5)
TIBC: 466 ug/dL — ABNORMAL HIGH (ref 250–450)
UIBC: 396 ug/dL — ABNORMAL HIGH (ref 117–376)

## 2023-03-03 LAB — FERRITIN: Ferritin: 23 ng/mL — ABNORMAL LOW (ref 24–336)

## 2023-03-03 NOTE — Patient Instructions (Signed)

## 2023-03-03 NOTE — Progress Notes (Signed)
Hematology and Oncology Follow Up Visit  Dale [name] [id number] 1960-04-02 63 y.o. 03/03/2023   Principle Diagnosis:  Iron deficiency anemia after frequent phlebotomy for elevated Hgb with testosterone use and untreated sleep apnea.   Current Therapy:        IV iron as indicated in past now on oral iron every other day Now observation and phlebotomy PRN for elevated Hgb   Interim History:  Dale [name] is here today for follow-up.   He reports that he is doing well overall. He has an colonoscopy scheduled for 26th 2025.  His last phlebotomy was on 12/25/2022. He is on 0.5 of his testosterone injections. He is still hesitant to reduce further but is open to discussing this with his prescriber.  He does not smoke.  He does take an 81 mg asa daily  He is also taking a iron supplement every other day to help with the fatigue associated with his iron deficiency secondary to the phlebotomies.  We go back and forth with iron deficiency vs erythrocytosis with oral iron and phlebotomy when needed.   He continues his weekly testosterone injection which has helped his quality of life.  No abnormal blood loss, bruising or petechiae.  He has untreated sleep apnea- he is now wearing a mouth device which helps some but he does not wear it every night.  No fever, chills, n/v, cough, rash, dizziness, SOB, chest pain, palpitations, abdominal pain or changes in bladder habits at this time.  No swelling in his extremities.  No falls or syncope reported.  Appetite and hydration are good.  Wt Readings from Last 3 Encounters:  03/03/23 199 lb 1.3 oz (90.3 kg)  02/03/23 201 lb (91.2 kg)  12/22/22 201 lb (91.2 kg)   ECOG Performance Status: 1 - Symptomatic but completely ambulatory  Medications:  Allergies as of 03/03/2023       Reactions   Lisinopril Cough        Medication List        Accurate as of March 03, 2023 11:53 AM. If you have any questions, ask your nurse or doctor.           allopurinol 300 MG tablet Commonly known as: ZYLOPRIM TAKE 1 TABLET DAILY   amLODipine 5 MG tablet Commonly known as: NORVASC TAKE 1 TABLET DAILY   anastrozole 1 MG tablet Commonly known as: ARIMIDEX Take 0.5 mg by mouth once a week.   aspirin EC 81 MG tablet Take 81 mg by mouth every other day. Swallow whole.   atorvastatin 40 MG tablet Commonly known as: LIPITOR Take 1 tablet (40 mg total) by mouth daily.   cyanocobalamin 1000 MCG tablet Commonly known as: VITAMIN B12 Take 1,000 mcg by mouth once a week.   GLUCOSAMINE CHONDROITIN TRIPLE PO Take by mouth daily. With Tumeric   hydrocortisone cream 1 % Apply 1 application. topically as needed for itching.   Iron (Ferrous Sulfate) 325 (65 Fe) MG Tabs Take 1 tablet my mouth daily.   Jublia 10 % Soln Generic drug: Efinaconazole Apply topically daily.   LORazepam 2 MG tablet Commonly known as: ATIVAN TAKE 1 TABLET BY MOUTH EVERY 6 HOURS AS NEEDED   MAGNESIUM GLYCINATE PO Take by mouth.   Na Sulfate-K Sulfate-Mg Sulfate concentrate 17.5-3.13-1.6 GM/177ML Soln Take by mouth See admin instructions.   omeprazole 20 MG capsule Commonly known as: PRILOSEC Take 20 mg by mouth daily.   ONE-A-DAY MENS PO Take 1 capsule by mouth daily.   Pregnyl  10000 units injection Generic drug: human chorionic gonadotropin 0.4 ml ( 400 iu) Sub Q on day 3 and day 5 post testosterone injection   sildenafil 100 MG tablet Commonly known as: VIAGRA TAKE 1 TABLET BY MOUTH DAILY AS NEEDED FOR ERECTILE DYSFUNCTION   testosterone cypionate 100 MG/ML injection Commonly known as: DEPOTESTOTERONE CYPIONATE Inject into the muscle once a week. For IM use only- Takes 0.7 ml/ weekly.   valACYclovir 500 MG tablet Commonly known as: VALTREX TAKE 1 TABLET DAILY   VITAMIN K2-VITAMIN D3 PO Take by mouth daily.        Allergies:  Allergies  Allergen Reactions   Lisinopril Cough    Past Medical History, Surgical history, Social  history, and Family History were reviewed and updated.  Review of Systems: All other 10 point review of systems is negative.   Physical Exam:  height is 5\' 10"  (1.778 m) and weight is 199 lb 1.3 oz (90.3 kg). His oral temperature is 97.8 F (36.6 C). His blood pressure is 125/95 (abnormal) and his pulse is 91. His respiration is 19 and oxygen saturation is 98%.   Wt Readings from Last 3 Encounters:  03/03/23 199 lb 1.3 oz (90.3 kg)  02/03/23 201 lb (91.2 kg)  12/22/22 201 lb (91.2 kg)   Ocular: Sclerae unicteric, pupils equal, round and reactive to light Ear-nose-throat: Oropharynx clear, dentition fair Lymphatic: No cervical or supraclavicular adenopathy Lungs no rales or rhonchi, good excursion bilaterally Heart regular rate and rhythm, no murmur appreciated Abd soft, nontender, positive bowel sounds MSK no focal spinal tenderness, no joint edema Neuro: non-focal, well-oriented, appropriate affect  Lab Results  Component Value Date   WBC 5.1 03/03/2023   HGB 18.6 (H) 03/03/2023   HCT 55.3 (H) 03/03/2023   MCV 86.7 03/03/2023   PLT 189 03/03/2023   Lab Results  Component Value Date   FERRITIN 23 (L) 02/03/2023   IRON 51 02/03/2023   TIBC 449 02/03/2023   UIBC 398 (H) 02/03/2023   IRONPCTSAT 11 (L) 02/03/2023   Lab Results  Component Value Date   RETICCTPCT 2.3 08/24/2022   RBC 6.38 (H) 03/03/2023   No results found for: "KPAFRELGTCHN", "LAMBDASER", "KAPLAMBRATIO" No results found for: "IGGSERUM", "IGA", "IGMSERUM" No results found for: "TOTALPROTELP", "ALBUMINELP", "A1GS", "A2GS", "BETS", "BETA2SER", "GAMS", "MSPIKE", "SPEI"   Chemistry      Component Value Date/Time   NA 139 03/03/2023 1100   NA 141 09/03/2022 1404   K 4.2 03/03/2023 1100   CL 104 03/03/2023 1100   CO2 26 03/03/2023 1100   BUN 15 03/03/2023 1100   BUN 16 09/03/2022 1404   CREATININE 0.88 03/03/2023 1100   CREATININE 0.97 12/03/2021 1139      Component Value Date/Time   CALCIUM 10.0  03/03/2023 1100   ALKPHOS 77 03/03/2023 1100   AST 34 03/03/2023 1100   ALT 42 03/03/2023 1100   BILITOT 1.9 (H) 03/03/2023 1100     Encounter Diagnoses  Name Primary?   Secondary erythrocytosis Yes   Iron deficiency anemia, unspecified iron deficiency anemia type    Impression and Plan: Dale [name] is a 63 yo gentleman with history of iron deficiency anemia after agressive phlebotomizing at an outside office as well as likely malabsorption. He also has had erythrocytosis secondary to testosterone use and untreated sleep apnea.   Today Hgb is 18.6, HCT is 55.3 We had a long discussion about his testosterone use and untreated sleep apnea. I am concerned with his risks of MI/stroke, etc.  I have heavily encouraged him to wean off of his testosterone and to use his sleep apnea oral device/consider a CPAP machine.  Hydration with water and continuation of his ASA Phlebotomy is needed today- he is hesitant as his iron deficiency causes him fatigue. Ideally he would have a return visit in 1-2 weeks however I respect his autonomy   RTC 4 weeks APP, labs(CBC, CMP, iron, ferritin), +- phlebotomy   [name], PA-C 2/12/202511:53 AM

## 2023-03-03 NOTE — Progress Notes (Signed)
Dale [name] presents today for phlebotomy per MD orders. Phlebotomy procedure started at 1142 and ended at 1146. 500 grams removed. Patient refused to be observed for 30 minutes after procedure. VS taken.Patient tolerated procedure well. IV needle removed intact.

## 2023-03-04 ENCOUNTER — Encounter: Payer: Self-pay | Admitting: Medical Oncology

## 2023-03-09 ENCOUNTER — Other Ambulatory Visit: Payer: Self-pay | Admitting: Family Medicine

## 2023-03-09 ENCOUNTER — Encounter: Payer: Self-pay | Admitting: Internal Medicine

## 2023-03-11 ENCOUNTER — Other Ambulatory Visit: Payer: Self-pay

## 2023-03-11 DIAGNOSIS — Z79899 Other long term (current) drug therapy: Secondary | ICD-10-CM

## 2023-03-11 LAB — HEPATIC FUNCTION PANEL
ALT: 45 [IU]/L — ABNORMAL HIGH (ref 0–44)
AST: 30 [IU]/L (ref 0–40)
Albumin: 4.6 g/dL (ref 3.9–4.9)
Alkaline Phosphatase: 101 [IU]/L (ref 44–121)
Bilirubin Total: 1.6 mg/dL — ABNORMAL HIGH (ref 0.0–1.2)
Bilirubin, Direct: 0.43 mg/dL — ABNORMAL HIGH (ref 0.00–0.40)
Total Protein: 7 g/dL (ref 6.0–8.5)

## 2023-03-11 LAB — LIPID PANEL
Chol/HDL Ratio: 3 {ratio} (ref 0.0–5.0)
Cholesterol, Total: 160 mg/dL (ref 100–199)
HDL: 53 mg/dL (ref >39)
LDL Chol Calc (NIH): 84 mg/dL (ref 0–99)
Triglycerides: 128 mg/dL (ref 0–149)
VLDL Cholesterol Cal: 23 mg/dL (ref 5–40)

## 2023-03-11 MED ORDER — EZETIMIBE 10 MG PO TABS: 10.0000 mg | ORAL_TABLET | Freq: Every day | ORAL | 3 refills | Status: DC

## 2023-03-17 ENCOUNTER — Ambulatory Visit: Payer: 59 | Admitting: Internal Medicine

## 2023-03-17 ENCOUNTER — Encounter: Payer: Self-pay | Admitting: Internal Medicine

## 2023-03-17 VITALS — BP 125/78 | HR 71 | Temp 98.3°F | Resp 15 | Ht 70.0 in | Wt 199.0 lb

## 2023-03-17 DIAGNOSIS — K529 Noninfective gastroenteritis and colitis, unspecified: Secondary | ICD-10-CM

## 2023-03-17 DIAGNOSIS — K2100 Gastro-esophageal reflux disease with esophagitis, without bleeding: Secondary | ICD-10-CM

## 2023-03-17 DIAGNOSIS — K5730 Diverticulosis of large intestine without perforation or abscess without bleeding: Secondary | ICD-10-CM | POA: Diagnosis not present

## 2023-03-17 DIAGNOSIS — D509 Iron deficiency anemia, unspecified: Secondary | ICD-10-CM | POA: Diagnosis present

## 2023-03-17 MED ORDER — SODIUM CHLORIDE 0.9 % IV SOLN: 500.0000 mL | Freq: Once | INTRAVENOUS | Status: DC

## 2023-03-17 NOTE — Progress Notes (Signed)
 Called to room to assist during endoscopic procedure.  Patient ID and intended procedure confirmed with present staff. Received instructions for my participation in the procedure from the performing physician.

## 2023-03-17 NOTE — Progress Notes (Signed)
 GASTROENTEROLOGY PROCEDURE H&P NOTE   Primary Care Physician: Nelwyn Salisbury, MD    Reason for Procedure:   iron deficiency anemia, chronic diarrhea, history of adenomatous colon polyps and GERD with esophagitis  Plan:    EGD and colonoscopy  Patient is appropriate for endoscopic procedure(s) in the ambulatory ([facility]) setting.  The nature of the procedure, as well as the risks, benefits, and alternatives were carefully and thoroughly reviewed with the patient. Ample time for discussion and questions allowed. The patient understood, was satisfied, and agreed to proceed.     HPI: Dale [name] is a 63 y.o. male who presents for EGD and colonoscopy.  Medical history as below.  Tolerated the prep.  No recent chest pain or shortness of breath.  No abdominal pain today.  Past Medical History:  Diagnosis Date   Allergic rhinitis, seasonal    Anxiety    Arthritis    back   Depression    Genital herpes    GERD (gastroesophageal reflux disease)    Gross hematuria    Hiatal hernia    History of chest pain    s/p  nuclear stress test, 08-26-2009, normal perfusion without ischemia , ef 58%, stated non-cardiac chest pain   History of kidney stones    History of nonmelanoma skin cancer    excision left leg SCC;  excision of chest BCC    Hyperlipidemia    Hypertension    followed by pcp    Nocturia    OSA (obstructive sleep apnea)    Urethral lesion    prostatic   Wears glasses     Past Surgical History:  Procedure Laterality Date   COLONOSCOPY Bilateral 07/01/2018   per Dr. Rhea Belton, adenomatous polyp, repeat in 7 yrs   CYSTOSCOPY N/A 08/23/2019   Procedure: CYSTOSCOPY WITH TRANSURETHRAL BIOPSY;  Surgeon: Rene Paci, MD;  Location: [facility];  Service: Urology;  Laterality: N/A;   ESOPHAGOGASTRODUODENOSCOPY  05/28/2017   KNEE ARTHROSCOPY Right 01/09/2013   Procedure: ARTHROSCOPY KNEE;  Surgeon: Eldred Manges, MD;  Location: [facility] OR;  Service:  Orthopedics;  Laterality: Right;  Right knee scope, partial medial menisectomy   KNEE ARTHROSCOPY Left 2007   KNEE SURGERY Right 1983;  1989;  1990; 2001; last one @ [facility] 06-10-2000   LUMBAR DISC SURGERY  2018   POSTERIOR LUMBAR FUSION  12-21-2011  @[facility]    L4--5   ROTATOR CUFF REPAIR Left 2003   TOTAL KNEE ARTHROPLASTY Right 07/10/2013   Procedure: RIGHT TOTAL KNEE ARTHROPLASTY;  Surgeon: Loanne Drilling, MD;  Location: [facility] ORS;  Service: Orthopedics;  Laterality: Right;   VASECTOMY      Prior to Admission medications   Medication Sig Start Date End Date Taking? Authorizing Provider  allopurinol (ZYLOPRIM) 300 MG tablet TAKE 1 TABLET DAILY 01/11/23  Yes Nelwyn Salisbury, MD  amLODipine (NORVASC) 5 MG tablet TAKE 1 TABLET DAILY 02/08/23  Yes Nelwyn Salisbury, MD  anastrozole (ARIMIDEX) 1 MG tablet Take 0.5 mg by mouth once a week.   Yes [provider]  aspirin EC 81 MG tablet Take 81 mg by mouth every other day. Swallow whole.   Yes [provider]  atorvastatin (LIPITOR) 40 MG tablet Take 1 tablet (40 mg total) by mouth daily. 11/13/22  Yes Azalee Course, PA  human chorionic gonadotropin (PREGNYL) 04540 units injection 0.4 ml ( 400 iu) Sub Q on day 3 and day 5 post testosterone injection 03/02/22  Yes [provider]  hydrocortisone cream 1 % Apply 1 application. topically as needed for itching.   Yes [provider]  JUBLIA 10 % SOLN Apply topically daily. 03/17/22  Yes [provider]  LORazepam (ATIVAN) 2 MG tablet TAKE 1 TABLET BY MOUTH EVERY 6 HOURS AS NEEDED 03/18/22  Yes Nelwyn Salisbury, MD  MAGNESIUM GLYCINATE PO Take by mouth.   Yes [provider]  Misc Natural Products (GLUCOSAMINE CHONDROITIN TRIPLE PO) Take by mouth daily. With Tumeric   Yes [provider]  omeprazole (PRILOSEC) 20 MG capsule Take 20 mg by mouth daily.   Yes [provider]  vitamin B-12 (CYANOCOBALAMIN) 1000 MCG tablet Take 1,000 mcg by mouth once a week.    Yes [provider]  Vitamin D-Vitamin K (VITAMIN K2-VITAMIN D3 PO) Take by mouth daily.   Yes [provider]  ezetimibe (ZETIA) 10 MG tablet Take 1 tablet (10 mg total) by mouth daily. 03/11/23 06/09/23  Azalee Course, PA  Iron, Ferrous Sulfate, 325 (65 Fe) MG TABS Take 1 tablet my mouth daily. 12/17/21   Nelwyn Salisbury, MD  Multiple Vitamin (ONE-A-DAY MENS PO) Take 1 capsule by mouth daily.    [provider]  sildenafil (VIAGRA) 100 MG tablet TAKE 1 TABLET BY MOUTH DAILY AS NEEDED FOR ERECTILE DYSFUNCTION 05/22/22   Nelwyn Salisbury, MD  testosterone cypionate (DEPOTESTOTERONE CYPIONATE) 100 MG/ML injection Inject into the muscle once a week. For IM use only- Takes 0.7 ml/ weekly.    [provider]  valACYclovir (VALTREX) 500 MG tablet TAKE 1 TABLET DAILY 03/10/23   Nelwyn Salisbury, MD    Current Outpatient Medications  Medication Sig Dispense Refill   allopurinol (ZYLOPRIM) 300 MG tablet TAKE 1 TABLET DAILY 90 tablet 0   amLODipine (NORVASC) 5 MG tablet TAKE 1 TABLET DAILY 90 tablet 1   anastrozole (ARIMIDEX) 1 MG tablet Take 0.5 mg by mouth once a week.     aspirin EC 81 MG tablet Take 81 mg by mouth every other day. Swallow whole.     atorvastatin (LIPITOR) 40 MG tablet Take 1 tablet (40 mg total) by mouth daily. 90 tablet 3   human chorionic gonadotropin (PREGNYL) 44010 units injection 0.4 ml ( 400 iu) Sub Q on day 3 and day 5 post testosterone injection     hydrocortisone cream 1 % Apply 1 application. topically as needed for itching.     JUBLIA 10 % SOLN Apply topically daily.     LORazepam (ATIVAN) 2 MG tablet TAKE 1 TABLET BY MOUTH EVERY 6 HOURS AS NEEDED 120 tablet 5   MAGNESIUM GLYCINATE PO Take by mouth.     Misc Natural Products (GLUCOSAMINE CHONDROITIN TRIPLE PO) Take by mouth daily. With Tumeric     omeprazole (PRILOSEC) 20 MG capsule Take 20 mg by mouth daily.     vitamin B-12 (CYANOCOBALAMIN) 1000 MCG tablet Take 1,000 mcg by mouth once a week.      Vitamin D-Vitamin K (VITAMIN K2-VITAMIN D3 PO) Take by mouth daily.     ezetimibe (ZETIA) 10 MG tablet Take 1 tablet (10 mg total) by mouth daily. 90 tablet 3   Iron, Ferrous Sulfate, 325 (65 Fe) MG TABS Take 1 tablet my mouth daily. 90 tablet 3   Multiple Vitamin (ONE-A-DAY MENS PO) Take 1 capsule by mouth daily.     sildenafil (VIAGRA) 100 MG tablet TAKE 1 TABLET BY MOUTH DAILY AS NEEDED FOR ERECTILE DYSFUNCTION 30 tablet 5   testosterone  cypionate (DEPOTESTOTERONE CYPIONATE) 100 MG/ML injection Inject into the muscle once a week. For IM use only- Takes 0.7 ml/ weekly.     valACYclovir (VALTREX) 500 MG tablet TAKE 1 TABLET DAILY 90 tablet 0   Current Facility-Administered Medications  Medication Dose Route Frequency Provider Last Rate Last Admin   0.9 %  sodium chloride infusion  500 mL Intravenous Once Keyarra Rendall, Carie Caddy, MD        Allergies as of 03/17/2023 - Review Complete 03/17/2023  Allergen Reaction Noted   Lisinopril Cough 10/12/2016    Family History  Problem Relation Age of Onset   Hypertension Mother    Hyperlipidemia Mother    Heart attack Father    Coronary artery disease Father    Heart disease Father    Depression Other        family hx   Hyperlipidemia Other        family hx   Hypertension Other        family hx   Lung cancer Other        family hx   Stroke Other        family hx   Colon cancer Neg Hx    Esophageal cancer Neg Hx    Rectal cancer Neg Hx    Stomach cancer Neg Hx    Colon polyps Neg Hx    Pancreatic cancer Neg Hx     Social History   Socioeconomic History   Marital status: Divorced    Spouse name: Not on file   Number of children: 1   Years of education: Not on file   Highest education level: Associate degree: occupational, Scientist, product/process development, or vocational program  Occupational History   Occupation: Retired - Systems analyst work  Tobacco Use   Smoking status: Some Days    Types: Cigarettes   Smokeless tobacco: Never   Tobacco comments:     09/03/2022 Patient smokes a cigar occasionaly  Vaping Use   Vaping status: Never Used  Substance and Sexual Activity   Alcohol use: Yes    Comment: occasional   Drug use: Never   Sexual activity: Yes    Comment: VASECTOMY  Other Topics Concern   Not on file  Social History Narrative   Not on file   Social Drivers of Health   Financial Resource Strain: Patient Declined (03/17/2022)   Overall Financial Resource Strain (CARDIA)    Difficulty of Paying Living Expenses: Patient declined  Food Insecurity: No Food Insecurity (03/31/2022)   Hunger Vital Sign    Worried About Running Out of Food in the Last Year: Never true    Ran Out of Food in the Last Year: Never true  Transportation Needs: No Transportation Needs (03/17/2022)   PRAPARE - Administrator, Civil Service (Medical): No    Lack of Transportation (Non-Medical): No  Physical Activity: Unknown (03/17/2022)   Exercise Vital Sign    Days of Exercise per Week: 3 days    Minutes of Exercise per Session: Patient declined  Stress: Patient Declined (03/17/2022)   [employer] of Occupational Health - Occupational Stress Questionnaire    Feeling of Stress : Patient declined  Social Connections: Unknown (03/17/2022)   Social Connection and Isolation Panel [NHANES]    Frequency of Communication with Friends and Family: Patient declined    Frequency of Social Gatherings with Friends and Family: Patient declined    Attends Religious Services: Patient declined    Database administrator or Organizations: Patient declined  Attends Banker Meetings: Not on file    Marital Status: Divorced  Intimate Partner Violence: Not At Risk (03/31/2022)   Humiliation, Afraid, Rape, and Kick questionnaire    Fear of Current or Ex-Partner: No    Emotionally Abused: No    Physically Abused: No    Sexually Abused: No    Physical Exam: Vital signs in last 24 hours: @BP  133/88   Pulse 97   Temp 98.3 F (36.8 C) (Skin)    Ht 5\' 10"  (1.778 m)   Wt 199 lb (90.3 kg)   SpO2 96%   BMI 28.55 kg/m  GEN: NAD EYE: Sclerae anicteric ENT: MMM CV: Non-tachycardic Pulm: CTA b/l GI: Soft, NT/ND NEURO:  Alert & Oriented x 3   Erick Blinks, MD [facility]  03/17/2023 2:51 PM

## 2023-03-17 NOTE — Op Note (Signed)
 [facility] Patient Name: Dale [name] Procedure Date: 03/17/2023 3:00 PM MRN: [id number] Endoscopist: Beverley Fiedler , MD, [id number] Age: 63 Referring MD:  Date of Birth: January 27, 1960 Gender: Male Account #: [id number] Procedure:                Upper GI endoscopy Indications:              Iron deficiency anemia, Gastro-esophageal reflux                            disease controlled on daily omeprazole Medicines:                Monitored Anesthesia Care Procedure:                Pre-Anesthesia Assessment:                           - Prior to the procedure, a History and Physical                            was performed, and patient medications and                            allergies were reviewed. The patient's tolerance of                            previous anesthesia was also reviewed. The risks                            and benefits of the procedure and the sedation                            options and risks were discussed with the patient.                            All questions were answered, and informed consent                            was obtained. Prior Anticoagulants: The patient has                            taken no anticoagulant or antiplatelet agents. ASA                            Grade Assessment: II - A patient with mild systemic                            disease. After reviewing the risks and benefits,                            the patient was deemed in satisfactory condition to                            undergo the procedure.  After obtaining informed consent, the endoscope was                            passed under direct vision. Throughout the                            procedure, the patient's blood pressure, pulse, and                            oxygen saturations were monitored continuously. The                            Olympus scope [id number] was introduced through the                            mouth, and advanced  to the second part of duodenum.                            The upper GI endoscopy was accomplished without                            difficulty. The patient tolerated the procedure                            well. Scope In: Scope Out: Findings:                 The examined esophagus was normal.                           The entire examined stomach was normal. Biopsies                            were taken with a cold forceps for Helicobacter                            pylori testing.                           The examined duodenum was normal. Biopsies for                            histology were taken with a cold forceps for                            evaluation of celiac disease. Complications:            No immediate complications. Estimated Blood Loss:     Estimated blood loss: none. Impression:               - Normal esophagus.                           - Normal stomach. Biopsied.                           - Normal examined duodenum. Biopsied.  Recommendation:           - Patient has a contact number available for                            emergencies. The signs and symptoms of potential                            delayed complications were discussed with the                            patient. Return to normal activities tomorrow.                            Written discharge instructions were provided to the                            patient.                           - Resume previous diet.                           - Continue present medications.                           - Await pathology results.                           - See the other procedure note for documentation of                            additional recommendations. Beverley Fiedler, MD 03/17/2023 3:30:59 PM This report has been signed electronically.

## 2023-03-17 NOTE — Progress Notes (Signed)
 To pacu, VSS. Report to [email or web address]

## 2023-03-17 NOTE — Patient Instructions (Signed)
 Handout given on diverticulosis.  YOU HAD AN ENDOSCOPIC PROCEDURE TODAY AT THE [facility]:   Refer to the procedure report that was given to you for any specific questions about what was found during the examination.  If the procedure report does not answer your questions, please call your gastroenterologist to clarify.  If you requested that your care partner not be given the details of your procedure findings, then the procedure report has been included in a sealed envelope for you to review at your convenience later.  YOU SHOULD EXPECT: Some feelings of bloating in the abdomen. Passage of more gas than usual.  Walking can help get rid of the air that was put into your GI tract during the procedure and reduce the bloating. If you had a lower endoscopy (such as a colonoscopy or flexible sigmoidoscopy) you may notice spotting of blood in your stool or on the toilet paper. If you underwent a bowel prep for your procedure, you may not have a normal bowel movement for a few days.  Please Note:  You might notice some irritation and congestion in your nose or some drainage.  This is from the oxygen used during your procedure.  There is no need for concern and it should clear up in a day or so.  SYMPTOMS TO REPORT IMMEDIATELY:  Following lower endoscopy (colonoscopy or flexible sigmoidoscopy):  Excessive amounts of blood in the stool  Significant tenderness or worsening of abdominal pains  Swelling of the abdomen that is new, acute  Fever of 100F or higher  Following upper endoscopy (EGD)  Vomiting of blood or coffee ground material  New chest pain or pain under the shoulder blades  Painful or persistently difficult swallowing  New shortness of breath  Fever of 100F or higher  Black, tarry-looking stools  For urgent or emergent issues, a gastroenterologist can be reached at any hour by calling [phone]. Do not use MyChart messaging for urgent concerns.    DIET:  We do  recommend a small meal at first, but then you may proceed to your regular diet.  Drink plenty of fluids but you should avoid alcoholic beverages for 24 hours.  ACTIVITY:  You should plan to take it easy for the rest of today and you should NOT DRIVE or use heavy machinery until tomorrow (because of the sedation medicines used during the test).    FOLLOW UP: Our staff will call the number listed on your records the next business day following your procedure.  We will call around 7:15- 8:00 am to check on you and address any questions or concerns that you may have regarding the information given to you following your procedure. If we do not reach you, we will leave a message.     If any biopsies were taken you will be contacted by phone or by letter within the next 1-3 weeks.  Please call us at [phone] if you have not heard about the biopsies in 3 weeks.   Our office will call to schedule the GI endoscopy capsule study, if not already scheduled.   SIGNATURES/CONFIDENTIALITY: You and/or your care partner have signed paperwork which will be entered into your electronic medical record.  These signatures attest to the fact that that the information above on your After Visit Summary has been reviewed and is understood.  Full responsibility of the confidentiality of this discharge information lies with you and/or your care-partner.

## 2023-03-17 NOTE — Op Note (Signed)
 [facility] Patient Name: Dale [name] Procedure Date: 03/17/2023 2:52 PM MRN: [id number] Endoscopist: Beverley Fiedler , MD, [id number] Age: 63 Referring MD:  Date of Birth: Aug 01, 1960 Gender: Male Account #: [id number] Procedure:                Colonoscopy Indications:              Iron deficiency anemia, hx of non-advanced adenoma                            at last exam June 2020, chronic diarrhea Medicines:                Monitored Anesthesia Care Procedure:                Pre-Anesthesia Assessment:                           - Prior to the procedure, a History and Physical                            was performed, and patient medications and                            allergies were reviewed. The patient's tolerance of                            previous anesthesia was also reviewed. The risks                            and benefits of the procedure and the sedation                            options and risks were discussed with the patient.                            All questions were answered, and informed consent                            was obtained. Prior Anticoagulants: The patient has                            taken no anticoagulant or antiplatelet agents. ASA                            Grade Assessment: II - A patient with mild systemic                            disease. After reviewing the risks and benefits,                            the patient was deemed in satisfactory condition to                            undergo the procedure.  After obtaining informed consent, the colonoscope                            was passed under direct vision. Throughout the                            procedure, the patient's blood pressure, pulse, and                            oxygen saturations were monitored continuously. The                            CF [id number] #[id number] was introduced through the anus                            and advanced to the  terminal ileum. The colonoscopy                            was performed without difficulty. The patient                            tolerated the procedure well. The quality of the                            bowel preparation was good. The terminal ileum,                            ileocecal valve, appendiceal orifice, and rectum                            were photographed. Scope In: 3:17:14 PM Scope Out: 3:28:35 PM Scope Withdrawal Time: 0 hours 10 minutes 19 seconds  Total Procedure Duration: 0 hours 11 minutes 21 seconds  Findings:                 The digital rectal exam was normal.                           The terminal ileum appeared normal.                           Multiple small-mouthed diverticula were found in                            the sigmoid colon and descending colon.                           The exam was otherwise without abnormality on                            direct and retroflexion views.                           Biopsies for histology were taken with a cold  forceps from the right colon and left colon for                            evaluation of microscopic colitis. Complications:            No immediate complications. Estimated Blood Loss:     Estimated blood loss: none. Impression:               - The examined portion of the ileum was normal.                           - Mild diverticulosis in the sigmoid colon and in                            the descending colon.                           - The examination was otherwise normal on direct                            and retroflexion views.                           - Biopsies were taken with a cold forceps from the                            right colon and left colon for evaluation of                            microscopic colitis. Recommendation:           - Patient has a contact number available for                            emergencies. The signs and symptoms of potential                             delayed complications were discussed with the                            patient. Return to normal activities tomorrow.                            Written discharge instructions were provided to the                            patient.                           - Resume previous diet.                           - Continue present medications.                           - Await pathology results. Could consider rifaximin  or Viberzi depending on biopsy results.                           - Proceed with video capsule endoscopy to complete                            GI evaluation for IDA>                           - Repeat colonoscopy in 10 years for surveillance. Beverley Fiedler, MD 03/17/2023 3:33:59 PM This report has been signed electronically.

## 2023-03-18 ENCOUNTER — Telehealth: Payer: Self-pay

## 2023-03-18 NOTE — Telephone Encounter (Signed)
  Follow up Call-     03/17/2023    1:59 PM  Call back number  Post procedure Call Back phone  # [phone]  Permission to leave phone message Yes     Patient questions:  Do you have a fever, pain , or abdominal swelling? No. Pain Score  0 *  Have you tolerated food without any problems? Yes.    Have you been able to return to your normal activities? Yes.    Do you have any questions about your discharge instructions: Diet   No. Medications  No. Follow up visit  No.  Do you have questions or concerns about your Care? No.  Actions: * If pain score is 4 or above: No action needed, pain <4.

## 2023-03-19 ENCOUNTER — Other Ambulatory Visit: Payer: Self-pay

## 2023-03-19 DIAGNOSIS — D509 Iron deficiency anemia, unspecified: Secondary | ICD-10-CM

## 2023-03-22 ENCOUNTER — Telehealth: Payer: Self-pay | Admitting: Internal Medicine

## 2023-03-22 LAB — SURGICAL PATHOLOGY

## 2023-03-22 NOTE — Telephone Encounter (Signed)
 Patient reports he is going out of town on 03/23/23 and needs to reschedule his capsule endoscopy. Rescheduled capsule for 03/30/23 at 8:30 am. Offered to send new instructions to patient in MyChart and patient declined and said he can adjust it himself.

## 2023-03-22 NOTE — Telephone Encounter (Signed)
 Patient called to reschedule capsule Endo.

## 2023-03-24 ENCOUNTER — Encounter: Payer: Self-pay | Admitting: Internal Medicine

## 2023-03-30 ENCOUNTER — Telehealth: Payer: Self-pay | Admitting: Internal Medicine

## 2023-03-30 ENCOUNTER — Ambulatory Visit: Admitting: Internal Medicine

## 2023-03-30 DIAGNOSIS — D509 Iron deficiency anemia, unspecified: Secondary | ICD-10-CM | POA: Diagnosis not present

## 2023-03-30 DIAGNOSIS — K317 Polyp of stomach and duodenum: Secondary | ICD-10-CM | POA: Diagnosis not present

## 2023-03-30 DIAGNOSIS — K3189 Other diseases of stomach and duodenum: Secondary | ICD-10-CM | POA: Diagnosis not present

## 2023-03-30 NOTE — Progress Notes (Signed)
 Patient returns to office. The recorder screen has gone black. The indicator light is no longer flashing. I was able to get the recorder to come back on, but it did not reconnect with the capsule. Recording equipment removed from patient. Patient tells me he has had 1 bowel movement "that wasn't much, mostly clear." Patient released to go home.

## 2023-03-30 NOTE — Telephone Encounter (Signed)
 See capsule endoscopy visit for further information.

## 2023-03-30 NOTE — Patient Instructions (Signed)
POST CAPSULE INSTRUCTIONS:   Contact our office immediately at [phone] if you suffer from any abdominal pain, nausea, or vomiting during capsule endoscopy. Do not eat or drink for at least 2 hours. After 2 hours you may have any of the following to drink: Water                           White grape juice 7-Up                            Chicken Bouillon Sprite                           Ginger Ale  After 4 hours you may have a light snack to include any of the following: A cup of soup               sandwich Bowl of cereal             Rice Toast                           Eggs 2-3 small cookies (i.e. vanilla wafers or graham crackers)  Return to this office at 4:00 pm. After this you will be able to return to your normal diet.  During your procedure do not go near anyone else that is having capsule endoscopy.  Do not be in close contact with an MRI machine or a radio or television tower.   Do not wear a heavy coat or sweater because your recorder may over heat and stop recording.   Do not disconnect the equipment or remove the belt at any time.  Since the Data Recorder is actually a small computer, it should be treated with utmost care and protection.  Avoid sudden movement and banging of the Data Recorder.    Do not do any heavy lifting or strenuous physical activity during the test especially if it involves sweating.  Do not bend over or stoop during capsule endoscopy.  During capsule endoscopy, you will need to verify every 15 minutes that the small light on top of the Data Recorder is blinking twice per second.  If for some reason it stops blinking, record the time and contact our office at [phone].                     

## 2023-03-30 NOTE — Procedures (Signed)
 CAPSULE ID: [id number] Exp: 2024-04-03 LOT: [id number]  Patient arrived for capsule endoscopy. Reported the prep went well. Confirmed patient is fasting. Explained dietary restrictions for the next few hours. Patient verbalized understanding. Opened capsule, ensured capsule was flashing and transmitting to the recorder prior to the patient swallowing the capsule. Patient swallowed capsule without difficulty. Patient told to call the office with any questions. Understands to return to the office today between 4:00 and 4:30 pm. No further questions by the conclusion of the visit.

## 2023-03-30 NOTE — Telephone Encounter (Signed)
 Spoke with the patient and asked that he return to the office. He has confirmed he does not see any blue blinking light.

## 2023-03-30 NOTE — Progress Notes (Signed)
POST CAPSULE INSTRUCTIONS:   Contact our office immediately at [phone] if you suffer from any abdominal pain, nausea, or vomiting during capsule endoscopy. Do not eat or drink for at least 2 hours. After 2 hours you may have any of the following to drink: Water                           White grape juice 7-Up                            Chicken Bouillon Sprite                           Ginger Ale  After 4 hours you may have a light snack to include any of the following: A cup of soup               sandwich Bowl of cereal             Rice Toast                           Eggs 2-3 small cookies (i.e. vanilla wafers or graham crackers)  Return to this office at 4:00 pm. After this you will be able to return to your normal diet.  During your procedure do not go near anyone else that is having capsule endoscopy.  Do not be in close contact with an MRI machine or a radio or television tower.   Do not wear a heavy coat or sweater because your recorder may over heat and stop recording.   Do not disconnect the equipment or remove the belt at any time.  Since the Data Recorder is actually a small computer, it should be treated with utmost care and protection.  Avoid sudden movement and banging of the Data Recorder.    Do not do any heavy lifting or strenuous physical activity during the test especially if it involves sweating.  Do not bend over or stoop during capsule endoscopy.  During capsule endoscopy, you will need to verify every 15 minutes that the small light on top of the Data Recorder is blinking twice per second.  If for some reason it stops blinking, record the time and contact our office at [phone].                     

## 2023-03-30 NOTE — Telephone Encounter (Signed)
 PT is calling in reference to the capsule EGD. He said it was making a beeping noise and then suddenly stopped. Please advise.

## 2023-03-31 ENCOUNTER — Inpatient Hospital Stay: Payer: 59

## 2023-03-31 ENCOUNTER — Ambulatory Visit: Payer: 59 | Admitting: Medical Oncology

## 2023-04-08 ENCOUNTER — Other Ambulatory Visit: Payer: Self-pay | Admitting: Family Medicine

## 2023-04-08 DIAGNOSIS — M1A0620 Idiopathic chronic gout, left knee, without tophus (tophi): Secondary | ICD-10-CM

## 2023-04-14 ENCOUNTER — Telehealth: Payer: Self-pay | Admitting: *Deleted

## 2023-04-14 NOTE — Telephone Encounter (Signed)
 Contacted patient and advised that we received capsule endoscopy results from 03/30/2023. Advised that this was a complete capsule endoscopy study with no findings to explain his iron deficiency. Incidentally, there was one small bowel polyp found that appears benign and non-bleeding. We discussed Dr Lauro Franklin recommendation for a repeat capsule endoscopy in 1 year for reassessment of the polyp. Patient verbalizes understanding and voices agreement with this plan.

## 2023-04-19 ENCOUNTER — Encounter: Payer: Self-pay | Admitting: Internal Medicine

## 2023-04-20 ENCOUNTER — Encounter: Payer: Self-pay | Admitting: Family Medicine

## 2023-04-20 ENCOUNTER — Ambulatory Visit: Admitting: Family Medicine

## 2023-04-20 VITALS — BP 130/80 | HR 99 | Temp 98.1°F | Wt 201.0 lb

## 2023-04-20 DIAGNOSIS — R3121 Asymptomatic microscopic hematuria: Secondary | ICD-10-CM | POA: Diagnosis not present

## 2023-04-20 DIAGNOSIS — J3089 Other allergic rhinitis: Secondary | ICD-10-CM | POA: Diagnosis not present

## 2023-04-20 DIAGNOSIS — F418 Other specified anxiety disorders: Secondary | ICD-10-CM | POA: Insufficient documentation

## 2023-04-20 DIAGNOSIS — B351 Tinea unguium: Secondary | ICD-10-CM | POA: Diagnosis not present

## 2023-04-20 LAB — URINALYSIS
Bilirubin Urine: NEGATIVE
Hgb urine dipstick: NEGATIVE
Leukocytes,Ua: NEGATIVE
Nitrite: NEGATIVE
Specific Gravity, Urine: 1.015 (ref 1.000–1.030)
Total Protein, Urine: NEGATIVE
Urine Glucose: NEGATIVE
Urobilinogen, UA: 0.2 (ref 0.0–1.0)
pH: 6.5 (ref 5.0–8.0)

## 2023-04-20 MED ORDER — METHYLPREDNISOLONE ACETATE 40 MG/ML IJ SUSP
40.0000 mg | Freq: Once | INTRAMUSCULAR | Status: AC
  Administered 2023-04-20: 40 mg via INTRAMUSCULAR

## 2023-04-20 MED ORDER — TERBINAFINE HCL 250 MG PO TABS: 250.0000 mg | ORAL_TABLET | Freq: Every day | ORAL | 3 refills | Status: DC

## 2023-04-20 MED ORDER — METHYLPREDNISOLONE ACETATE 80 MG/ML IJ SUSP
80.0000 mg | Freq: Once | INTRAMUSCULAR | Status: AC
Start: 2023-04-20 — End: 2023-04-20
  Administered 2023-04-20: 80 mg via INTRAMUSCULAR

## 2023-04-20 MED ORDER — ESCITALOPRAM OXALATE 10 MG PO TABS: 10.0000 mg | ORAL_TABLET | Freq: Every day | ORAL | 3 refills | Status: DC

## 2023-04-20 NOTE — Addendum Note (Signed)
 Addended by: Carola Rhine on: 04/20/2023 11:25 AM   Modules accepted: Orders

## 2023-04-20 NOTE — Progress Notes (Signed)
   Subjective:    Patient ID: Dale [name], male    DOB: April 04, 1960, 63 y.o.   MRN: [id number]  HPI Here for several issues. First his typical springtime allergies have flared up cuasing itchy eyes and sneezing. He is taking Zyrtec and Flonase. He also asks to check his urine for blood. He had seen Urology a few years ago for hematuria, and the workup was unrevealing. He has not seen any blood in his urine for a long time, but he wants to check it again. Also his depression has been a little worse the past few months. He took Prozac in the past, and this was helpful, but he stopped it due to sexual side effects. He asks to try Lexapro now. Finally he asks for help with toenail fungus. He took Terbinafine a few years ago, but this did not help. He then saw a dermatologist who started him on Jublia topically. This has not helped much either. He asks if could use both together.    Review of Systems  Constitutional: Negative.   HENT:  Positive for congestion, postnasal drip, rhinorrhea and sneezing. Negative for facial swelling and sore throat.   Eyes:  Positive for itching.  Respiratory: Negative.    Cardiovascular: Negative.   Genitourinary: Negative.   Psychiatric/Behavioral:  Positive for dysphoric mood. Negative for agitation, behavioral problems, confusion, decreased concentration, hallucinations, self-injury, sleep disturbance and suicidal ideas. The patient is nervous/anxious.        Objective:   Physical Exam Constitutional:      Appearance: Normal appearance.  HENT:     Right Ear: Tympanic membrane, ear canal and external ear normal.     Left Ear: Tympanic membrane, ear canal and external ear normal.     Nose: Nose normal.     Mouth/Throat:     Pharynx: Oropharynx is clear.  Eyes:     Conjunctiva/sclera: Conjunctivae normal.  Cardiovascular:     Rate and Rhythm: Normal rate and regular rhythm.     Pulses: Normal pulses.     Heart sounds: Normal heart sounds.  Pulmonary:      Effort: Pulmonary effort is normal.     Breath sounds: Normal breath sounds.  Lymphadenopathy:     Cervical: No cervical adenopathy.  Neurological:     Mental Status: He is alert.  Psychiatric:        Mood and Affect: Mood normal.        Behavior: Behavior normal.        Thought Content: Thought content normal.           Assessment & Plan:  For the allergies, he is given a shot of DepoMedrol. For the hx of hematuria, we will check a UA today. For the depression with anxiety, he will try Lexapro 10 mg daily. For the toenail fungus, we will add Terbinafine 250 mg daily to the Jublia. Report back in 30 days.  [name], MD

## 2023-04-22 ENCOUNTER — Encounter: Payer: Self-pay | Admitting: *Deleted

## 2023-04-26 NOTE — Telephone Encounter (Signed)
 He can continue to use the Lorazepam safely

## 2023-04-30 ENCOUNTER — Other Ambulatory Visit: Payer: Self-pay

## 2023-04-30 DIAGNOSIS — D500 Iron deficiency anemia secondary to blood loss (chronic): Secondary | ICD-10-CM

## 2023-05-03 ENCOUNTER — Inpatient Hospital Stay: Admitting: Medical Oncology

## 2023-05-03 ENCOUNTER — Inpatient Hospital Stay: Attending: Medical Oncology

## 2023-05-03 ENCOUNTER — Encounter: Payer: Self-pay | Admitting: Medical Oncology

## 2023-05-03 ENCOUNTER — Other Ambulatory Visit: Payer: Self-pay

## 2023-05-03 ENCOUNTER — Inpatient Hospital Stay

## 2023-05-03 VITALS — BP 117/74 | HR 107 | Temp 98.6°F | Resp 17 | Ht 70.0 in | Wt 198.0 lb

## 2023-05-03 VITALS — BP 134/76 | HR 89 | Temp 98.2°F | Resp 18

## 2023-05-03 DIAGNOSIS — D500 Iron deficiency anemia secondary to blood loss (chronic): Secondary | ICD-10-CM

## 2023-05-03 DIAGNOSIS — G4733 Obstructive sleep apnea (adult) (pediatric): Secondary | ICD-10-CM

## 2023-05-03 DIAGNOSIS — D509 Iron deficiency anemia, unspecified: Secondary | ICD-10-CM

## 2023-05-03 DIAGNOSIS — D751 Secondary polycythemia: Secondary | ICD-10-CM | POA: Diagnosis not present

## 2023-05-03 DIAGNOSIS — G4730 Sleep apnea, unspecified: Secondary | ICD-10-CM | POA: Diagnosis not present

## 2023-05-03 DIAGNOSIS — D508 Other iron deficiency anemias: Secondary | ICD-10-CM | POA: Insufficient documentation

## 2023-05-03 LAB — CBC WITH DIFFERENTIAL (CANCER CENTER ONLY)
Abs Immature Granulocytes: 0.03 10*3/uL (ref 0.00–0.07)
Basophils Absolute: 0.1 10*3/uL (ref 0.0–0.1)
Basophils Relative: 1 %
Eosinophils Absolute: 0.1 10*3/uL (ref 0.0–0.5)
Eosinophils Relative: 1 %
HCT: 53.6 % — ABNORMAL HIGH (ref 39.0–52.0)
Hemoglobin: 17.6 g/dL — ABNORMAL HIGH (ref 13.0–17.0)
Immature Granulocytes: 1 %
Lymphocytes Relative: 22 %
Lymphs Abs: 1.4 10*3/uL (ref 0.7–4.0)
MCH: 28.4 pg (ref 26.0–34.0)
MCHC: 32.8 g/dL (ref 30.0–36.0)
MCV: 86.5 fL (ref 80.0–100.0)
Monocytes Absolute: 0.7 10*3/uL (ref 0.1–1.0)
Monocytes Relative: 11 %
Neutro Abs: 4.1 10*3/uL (ref 1.7–7.7)
Neutrophils Relative %: 64 %
Platelet Count: 177 10*3/uL (ref 150–400)
RBC: 6.2 MIL/uL — ABNORMAL HIGH (ref 4.22–5.81)
RDW: 15.3 % (ref 11.5–15.5)
WBC Count: 6.5 10*3/uL (ref 4.0–10.5)
nRBC: 0 % (ref 0.0–0.2)

## 2023-05-03 LAB — CMP (CANCER CENTER ONLY)
ALT: 42 U/L (ref 0–44)
AST: 28 U/L (ref 15–41)
Albumin: 4.7 g/dL (ref 3.5–5.0)
Alkaline Phosphatase: 82 U/L (ref 38–126)
Anion gap: 10 (ref 5–15)
BUN: 12 mg/dL (ref 8–23)
CO2: 25 mmol/L (ref 22–32)
Calcium: 9.2 mg/dL (ref 8.9–10.3)
Chloride: 105 mmol/L (ref 98–111)
Creatinine: 0.93 mg/dL (ref 0.61–1.24)
GFR, Estimated: >60 mL/min (ref >60)
Glucose, Bld: 148 mg/dL — ABNORMAL HIGH (ref 70–99)
Potassium: 3.9 mmol/L (ref 3.5–5.1)
Sodium: 140 mmol/L (ref 135–145)
Total Bilirubin: 1.5 mg/dL — ABNORMAL HIGH (ref 0.0–1.2)
Total Protein: 7 g/dL (ref 6.5–8.1)

## 2023-05-03 LAB — FERRITIN: Ferritin: 13 ng/mL — ABNORMAL LOW (ref 24–336)

## 2023-05-03 LAB — IRON AND IRON BINDING CAPACITY (CC-WL,HP ONLY)
Iron: 94 ug/dL (ref 45–182)
Saturation Ratios: 21 % (ref 17.9–39.5)
TIBC: 444 ug/dL (ref 250–450)
UIBC: 350 ug/dL (ref 117–376)

## 2023-05-03 NOTE — Progress Notes (Signed)
 Hematology and Oncology Follow Up Visit  ROWDY GUERRINI [id number] 1960/12/14 63 y.o. 05/03/2023   Principle Diagnosis:  Iron deficiency anemia after frequent phlebotomy for elevated Hgb with testosterone use and untreated sleep apnea.   Current Therapy:        IV iron as indicated in past now on oral iron every other day Now observation and phlebotomy PRN for elevated Hgb   Interim History:  Mr. Silas is here today for follow-up.   Today he states that he has been well. He has had a colonoscopy. Endoscopy, and a capsule study which showed 1 small polyp of his small intestine. He returns in 1 year for follow up on this.  His last phlebotomy was on 12/25/2022. He is on 0.5 of his testosterone injections. He is still hesitant to reduce further but is open to discussing this with his prescriber.  He does not smoke.  He does take an 81 mg asa daily - he reports that he had some hematuria and had work up a few years ago by urology. They saw a small area that they cauterized and he has not had any episodes since.  He is also taking a iron supplement every other day to help with the fatigue associated with his iron deficiency secondary to the phlebotomies.  We go back and forth with iron deficiency vs erythrocytosis with oral iron and phlebotomy when needed.   He continues his weekly testosterone injection which has helped his quality of life.  No abnormal blood loss, bruising or petechiae.  He has untreated sleep apnea- he has an oral device but just had dental work completed and cannot wear it at the moment  No fever, chills, n/v, cough, rash, dizziness, SOB, chest pain, palpitations, abdominal pain or changes in bladder habits at this time.  No swelling in his extremities.  No falls or syncope reported.  Appetite and hydration are good.  Wt Readings from Last 3 Encounters:  05/03/23 198 lb (89.8 kg)  04/20/23 201 lb (91.2 kg)  03/17/23 199 lb (90.3 kg)   ECOG Performance Status: 1 -  Symptomatic but completely ambulatory  Medications:  Allergies as of 05/03/2023       Reactions   Lisinopril Cough        Medication List        Accurate as of May 03, 2023 11:30 AM. If you have any questions, ask your nurse or doctor.          allopurinol 300 MG tablet Commonly known as: ZYLOPRIM TAKE 1 TABLET DAILY   amLODipine 5 MG tablet Commonly known as: NORVASC TAKE 1 TABLET DAILY   anastrozole 1 MG tablet Commonly known as: ARIMIDEX Take 0.5 mg by mouth once a week.   aspirin EC 81 MG tablet Take 81 mg by mouth every other day. Swallow whole.   atorvastatin 40 MG tablet Commonly known as: LIPITOR Take 1 tablet (40 mg total) by mouth daily.   cyanocobalamin 1000 MCG tablet Commonly known as: VITAMIN B12 Take 1,000 mcg by mouth once a week.   escitalopram 10 MG tablet Commonly known as: Lexapro Take 1 tablet (10 mg total) by mouth daily.   ezetimibe 10 MG tablet Commonly known as: ZETIA Take 1 tablet (10 mg total) by mouth daily.   GLUCOSAMINE CHONDROITIN TRIPLE PO Take by mouth daily. With Tumeric   hydrocortisone cream 1 % Apply 1 application. topically as needed for itching.   Iron (Ferrous Sulfate) 325 (65 Fe) MG Tabs Take  1 tablet my mouth daily.   Jublia 10 % Soln Generic drug: Efinaconazole Apply topically daily.   LORazepam 2 MG tablet Commonly known as: ATIVAN TAKE 1 TABLET BY MOUTH EVERY 6 HOURS AS NEEDED   MAGNESIUM GLYCINATE PO Take by mouth.   omeprazole 20 MG capsule Commonly known as: PRILOSEC Take 20 mg by mouth daily.   ONE-A-DAY MENS PO Take 1 capsule by mouth daily.   Pregnyl 16109 units injection Generic drug: human chorionic gonadotropin 0.4 ml ( 400 iu) Sub Q on day 3 and day 5 post testosterone injection   sildenafil 100 MG tablet Commonly known as: VIAGRA TAKE 1 TABLET BY MOUTH DAILY AS NEEDED FOR ERECTILE DYSFUNCTION   terbinafine 250 MG tablet Commonly known as: LAMISIL Take 1 tablet (250 mg  total) by mouth daily.   testosterone cypionate 100 MG/ML injection Commonly known as: DEPOTESTOTERONE CYPIONATE Inject into the muscle once a week. For IM use only- Takes 0.7 ml/ weekly.   valACYclovir 500 MG tablet Commonly known as: VALTREX TAKE 1 TABLET DAILY   VITAMIN K2-VITAMIN D3 PO Take by mouth daily.        Allergies:  Allergies  Allergen Reactions   Lisinopril Cough    Past Medical History, Surgical history, Social history, and Family History were reviewed and updated.  Review of Systems: All other 10 point review of systems is negative.   Physical Exam:  height is 5\' 10"  (1.778 m) and weight is 198 lb (89.8 kg). His oral temperature is 98.6 F (37 C). His blood pressure is 117/74 and his pulse is 107 (abnormal). His respiration is 17 and oxygen saturation is 96%.   Wt Readings from Last 3 Encounters:  05/03/23 198 lb (89.8 kg)  04/20/23 201 lb (91.2 kg)  03/17/23 199 lb (90.3 kg)   Ocular: Sclerae unicteric, pupils equal, round and reactive to light Ear-nose-throat: Oropharynx clear, dentition fair Lymphatic: No cervical or supraclavicular adenopathy Lungs no rales or rhonchi, good excursion bilaterally Heart regular rate and rhythm, no murmur appreciated Abd soft, nontender, positive bowel sounds MSK no focal spinal tenderness, no joint edema Neuro: non-focal, well-oriented, appropriate affect  Lab Results  Component Value Date   WBC 6.5 05/03/2023   HGB 17.6 (H) 05/03/2023   HCT 53.6 (H) 05/03/2023   MCV 86.5 05/03/2023   PLT 177 05/03/2023   Lab Results  Component Value Date   FERRITIN 23 (L) 03/03/2023   IRON 70 03/03/2023   TIBC 466 (H) 03/03/2023   UIBC 396 (H) 03/03/2023   IRONPCTSAT 15 (L) 03/03/2023   Lab Results  Component Value Date   RETICCTPCT 2.3 08/24/2022   RBC 6.20 (H) 05/03/2023   No results found for: "KPAFRELGTCHN", "LAMBDASER", "KAPLAMBRATIO" No results found for: "IGGSERUM", "IGA", "IGMSERUM" No results found for:  "TOTALPROTELP", "ALBUMINELP", "A1GS", "A2GS", "BETS", "BETA2SER", "GAMS", "MSPIKE", "SPEI"   Chemistry      Component Value Date/Time   NA 139 03/03/2023 1100   NA 141 09/03/2022 1404   K 4.2 03/03/2023 1100   CL 104 03/03/2023 1100   CO2 26 03/03/2023 1100   BUN 15 03/03/2023 1100   BUN 16 09/03/2022 1404   CREATININE 0.88 03/03/2023 1100   CREATININE 0.97 12/03/2021 1139      Component Value Date/Time   CALCIUM 10.0 03/03/2023 1100   ALKPHOS 101 03/10/2023 0843   AST 30 03/10/2023 0843   AST 34 03/03/2023 1100   ALT 45 (H) 03/10/2023 0843   ALT 42 03/03/2023 1100  BILITOT 1.6 (H) 03/10/2023 0843   BILITOT 1.9 (H) 03/03/2023 1100     No diagnosis found.  Impression and Plan: Mr. Upchurch is a 63 yo gentleman with history of iron deficiency anemia after agressive phlebotomizing at an outside office as well as likely malabsorption. He also has had erythrocytosis secondary to testosterone use and untreated sleep apnea.   Today Hgb is down from 18.6 to 17.6, HCT is down from 55.3 to 53.6 We discussed his testosterone use and untreated sleep apnea. I am concerned with his risks of MI/stroke, etc. I have heavily encouraged him to wean off of his testosterone and to use his sleep apnea oral device/consider a CPAP machine.  Hydration with water and continuation of his ASA Phlebotomy is needed today- he will continue his iron supplement to help with his symptomatic iron deficiency secondary to his phlebotomies   RTC 8 weeks APP, labs(CBC, CMP, iron, ferritin), +- phlebotomy   Sharla Davis, PA-C 4/14/202511:30 AM

## 2023-05-03 NOTE — Patient Instructions (Signed)

## 2023-05-03 NOTE — Progress Notes (Signed)
 Dale [name] presents today for phlebotomy per MD orders. Phlebotomy procedure started at 12:11 and ended at 12:15. 500 grams removed. Patient observed for 30 minutes after procedure without any incident. Patient tolerated procedure well. 18 G needle needle removed intact. Patient had post phlebotomy snacks and drinks. VSS at discharge.

## 2023-05-04 ENCOUNTER — Encounter: Payer: Self-pay | Admitting: Medical Oncology

## 2023-05-31 ENCOUNTER — Encounter (HOSPITAL_COMMUNITY): Payer: Self-pay

## 2023-06-07 ENCOUNTER — Other Ambulatory Visit: Payer: Self-pay | Admitting: Family Medicine

## 2023-06-07 ENCOUNTER — Ambulatory Visit: Admitting: Family Medicine

## 2023-06-07 ENCOUNTER — Encounter: Payer: Self-pay | Admitting: Family Medicine

## 2023-06-07 VITALS — BP 116/62 | HR 98 | Temp 98.1°F | Ht 70.0 in | Wt 194.8 lb

## 2023-06-07 DIAGNOSIS — N529 Male erectile dysfunction, unspecified: Secondary | ICD-10-CM

## 2023-06-07 DIAGNOSIS — J3089 Other allergic rhinitis: Secondary | ICD-10-CM

## 2023-06-07 DIAGNOSIS — F900 Attention-deficit hyperactivity disorder, predominantly inattentive type: Secondary | ICD-10-CM

## 2023-06-07 MED ORDER — METHYLPREDNISOLONE ACETATE 40 MG/ML IJ SUSP
40.0000 mg | Freq: Once | INTRAMUSCULAR | Status: AC
  Administered 2023-06-07: 40 mg via INTRAMUSCULAR

## 2023-06-07 MED ORDER — AMPHETAMINE-DEXTROAMPHET ER 10 MG PO CP24
10.0000 mg | ORAL_CAPSULE | Freq: Every day | ORAL | 0 refills | Status: DC
Start: 2023-06-07 — End: 2023-07-02

## 2023-06-07 MED ORDER — SILDENAFIL CITRATE 100 MG PO TABS: ORAL_TABLET | ORAL | 5 refills | Status: AC

## 2023-06-07 MED ORDER — METHYLPREDNISOLONE ACETATE 80 MG/ML IJ SUSP
80.0000 mg | Freq: Once | INTRAMUSCULAR | Status: AC
  Administered 2023-06-07: 80 mg via INTRAMUSCULAR

## 2023-06-07 NOTE — Progress Notes (Signed)
   Subjective:    Patient ID: Dale [name], male    DOB: November 30, 1960, 63 y.o.   MRN: [id number]  HPI Here for several issues. First he asks for another steroid shot for his allergies. He has the usual itchy eyes, sneezing, etc. He is taking Zyrtec and using Flonase daily. The other issue is poor concentration. He says he has always had trouble focusing on things and getting tasks completed on time. He tends to think about many things at once, and he often forgets things. He tends to start several tasks at the same time but not complete them.    Review of Systems  Constitutional: Negative.   HENT:  Positive for congestion, postnasal drip, rhinorrhea and sneezing. Negative for ear pain, sinus pressure and sore throat.   Eyes:  Positive for itching.  Respiratory: Negative.    Psychiatric/Behavioral:  Positive for decreased concentration. Negative for agitation, confusion, dysphoric mood and hallucinations. The patient is not nervous/anxious.        Objective:   Physical Exam Constitutional:      Appearance: Normal appearance.  HENT:     Right Ear: Tympanic membrane, ear canal and external ear normal.     Left Ear: Tympanic membrane, ear canal and external ear normal.     Nose: Nose normal.     Mouth/Throat:     Pharynx: Oropharynx is clear.  Eyes:     Conjunctiva/sclera: Conjunctivae normal.  Cardiovascular:     Rate and Rhythm: Normal rate and regular rhythm.     Pulses: Normal pulses.     Heart sounds: Normal heart sounds.  Pulmonary:     Effort: Pulmonary effort is normal.     Breath sounds: Normal breath sounds.  Lymphadenopathy:     Cervical: No cervical adenopathy.  Neurological:     General: No focal deficit present.     Mental Status: He is alert and oriented to person, place, and time.  Psychiatric:        Mood and Affect: Mood normal.        Behavior: Behavior normal.        Thought Content: Thought content normal.           Assessment & Plan:  For his  seasonal allergies, he is given a shot of DepoMedrol. He also has ADHD, and he will try Adderall XR 10 mg each morning. Follow up in 3-4 weeks.  Corita Diego, MD

## 2023-06-07 NOTE — Addendum Note (Signed)
 Addended by: Kayshawn Ozburn on: 06/07/2023 11:52 AM   Modules accepted: Orders

## 2023-06-30 ENCOUNTER — Other Ambulatory Visit: Payer: Self-pay | Admitting: Medical Oncology

## 2023-06-30 DIAGNOSIS — D500 Iron deficiency anemia secondary to blood loss (chronic): Secondary | ICD-10-CM

## 2023-06-30 DIAGNOSIS — D751 Secondary polycythemia: Secondary | ICD-10-CM

## 2023-07-01 ENCOUNTER — Inpatient Hospital Stay

## 2023-07-01 ENCOUNTER — Encounter: Payer: Self-pay | Admitting: Medical Oncology

## 2023-07-01 ENCOUNTER — Ambulatory Visit: Payer: Self-pay | Admitting: Medical Oncology

## 2023-07-01 ENCOUNTER — Other Ambulatory Visit: Payer: Self-pay | Admitting: Family Medicine

## 2023-07-01 ENCOUNTER — Inpatient Hospital Stay: Attending: Medical Oncology

## 2023-07-01 ENCOUNTER — Inpatient Hospital Stay: Admitting: Medical Oncology

## 2023-07-01 VITALS — BP 139/77 | HR 76 | Temp 97.9°F | Resp 18 | Ht 70.0 in

## 2023-07-01 DIAGNOSIS — D751 Secondary polycythemia: Secondary | ICD-10-CM

## 2023-07-01 DIAGNOSIS — G4733 Obstructive sleep apnea (adult) (pediatric): Secondary | ICD-10-CM

## 2023-07-01 DIAGNOSIS — Z7982 Long term (current) use of aspirin: Secondary | ICD-10-CM | POA: Diagnosis not present

## 2023-07-01 DIAGNOSIS — Z79890 Hormone replacement therapy (postmenopausal): Secondary | ICD-10-CM | POA: Insufficient documentation

## 2023-07-01 DIAGNOSIS — D508 Other iron deficiency anemias: Secondary | ICD-10-CM | POA: Diagnosis present

## 2023-07-01 DIAGNOSIS — G4730 Sleep apnea, unspecified: Secondary | ICD-10-CM | POA: Insufficient documentation

## 2023-07-01 DIAGNOSIS — D509 Iron deficiency anemia, unspecified: Secondary | ICD-10-CM | POA: Diagnosis not present

## 2023-07-01 DIAGNOSIS — D500 Iron deficiency anemia secondary to blood loss (chronic): Secondary | ICD-10-CM

## 2023-07-01 LAB — CBC WITH DIFFERENTIAL (CANCER CENTER ONLY)
Abs Immature Granulocytes: 0.02 10*3/uL (ref 0.00–0.07)
Basophils Absolute: 0.1 10*3/uL (ref 0.0–0.1)
Basophils Relative: 1 %
Eosinophils Absolute: 0.2 10*3/uL (ref 0.0–0.5)
Eosinophils Relative: 3 %
HCT: 52.5 % — ABNORMAL HIGH (ref 39.0–52.0)
Hemoglobin: 17.8 g/dL — ABNORMAL HIGH (ref 13.0–17.0)
Immature Granulocytes: 0 %
Lymphocytes Relative: 22 %
Lymphs Abs: 1.5 10*3/uL (ref 0.7–4.0)
MCH: 29.6 pg (ref 26.0–34.0)
MCHC: 33.9 g/dL (ref 30.0–36.0)
MCV: 87.2 fL (ref 80.0–100.0)
Monocytes Absolute: 1 10*3/uL (ref 0.1–1.0)
Monocytes Relative: 15 %
Neutro Abs: 4.1 10*3/uL (ref 1.7–7.7)
Neutrophils Relative %: 59 %
Platelet Count: 155 10*3/uL (ref 150–400)
RBC: 6.02 MIL/uL — ABNORMAL HIGH (ref 4.22–5.81)
RDW: 16.6 % — ABNORMAL HIGH (ref 11.5–15.5)
WBC Count: 6.9 10*3/uL (ref 4.0–10.5)
nRBC: 0 % (ref 0.0–0.2)

## 2023-07-01 LAB — CMP (CANCER CENTER ONLY)
ALT: 35 U/L (ref 0–44)
AST: 26 U/L (ref 15–41)
Albumin: 4.7 g/dL (ref 3.5–5.0)
Alkaline Phosphatase: 72 U/L (ref 38–126)
Anion gap: 8 (ref 5–15)
BUN: 15 mg/dL (ref 8–23)
CO2: 26 mmol/L (ref 22–32)
Calcium: 9.2 mg/dL (ref 8.9–10.3)
Chloride: 103 mmol/L (ref 98–111)
Creatinine: 0.93 mg/dL (ref 0.61–1.24)
GFR, Estimated: >60 mL/min (ref >60)
Glucose, Bld: 94 mg/dL (ref 70–99)
Potassium: 4 mmol/L (ref 3.5–5.1)
Sodium: 137 mmol/L (ref 135–145)
Total Bilirubin: 1.8 mg/dL — ABNORMAL HIGH (ref 0.0–1.2)
Total Protein: 7.1 g/dL (ref 6.5–8.1)

## 2023-07-01 LAB — FERRITIN: Ferritin: 53 ng/mL (ref 24–336)

## 2023-07-01 LAB — IRON AND IRON BINDING CAPACITY (CC-WL,HP ONLY)
Iron: 121 ug/dL (ref 45–182)
Saturation Ratios: 28 % (ref 17.9–39.5)
TIBC: 431 ug/dL (ref 250–450)
UIBC: 310 ug/dL

## 2023-07-01 NOTE — Telephone Encounter (Signed)
 Requesting increase to 20mg .

## 2023-07-01 NOTE — Progress Notes (Signed)
 Hematology and Oncology Follow Up Visit  Dale [name] [id number] [birth year] 63 y.o. 07/01/2023   Principle Diagnosis:  Iron  deficiency anemia after frequent phlebotomy for elevated Hgb with testosterone  use and untreated sleep apnea.   Current Therapy:        IV iron  as indicated in past now on oral iron  every other day Now observation and phlebotomy PRN for elevated Hgb   Interim History:  Dale [name] is here today for follow-up.   He has no concerns today- he reports that he has been doing well.   He has had a colonoscopy, endoscopy, and a capsule study in 2025 which showed 1 small polyp of his small intestine. He returns in 1 year for follow up on this.  His last phlebotomy was on 05/03/2023 He is on 0.5 of his testosterone  injections. He declines further adjustments at this time.  He does not smoke.  He does take an 81 mg asa daily - he reports that he had some hematuria and had work up a few years ago by urology. They saw a small area that they cauterized and he has not had any episodes since.  He is also taking a iron  supplement every other day to help with the fatigue associated with his iron  deficiency secondary to the phlebotomies.  We go back and forth with iron  deficiency vs erythrocytosis with oral iron  and phlebotomy when needed.   He continues his weekly testosterone  injection which has helped his quality of life.  No abnormal blood loss, bruising or petechiae.  He has untreated sleep apnea- he has an oral device but just had dental work completed and cannot wear it at the moment. He also tried a CPAP machine but reports not being able to tolerate wearing this at night either.  No fever, chills, n/v, cough, rash, dizziness, SOB, chest pain, palpitations, abdominal pain or changes in bladder habits at this time.  No swelling in his extremities.  No falls or syncope reported.  Appetite and hydration are good.  Wt Readings from Last 3 Encounters:  06/07/23 194 lb 12.8 oz  (88.4 kg)  05/03/23 198 lb (89.8 kg)  04/20/23 201 lb (91.2 kg)   ECOG Performance Status: 1 - Symptomatic but completely ambulatory  Medications:  Allergies as of 07/01/2023       Reactions   Lisinopril  Cough        Medication List        Accurate as of July 01, 2023 11:35 AM. If you have any questions, ask your nurse or doctor.          STOP taking these medications    cetirizine 5 MG tablet Commonly known as: ZYRTEC Stopped by: Sharla Davis   ezetimibe  10 MG tablet Commonly known as: ZETIA  Stopped by: Sharla Davis   Jublia 10 % Soln Generic drug: Efinaconazole Stopped by: Sharla Davis       TAKE these medications    allopurinol  300 MG tablet Commonly known as: ZYLOPRIM  TAKE 1 TABLET DAILY   amLODipine  5 MG tablet Commonly known as: NORVASC  TAKE 1 TABLET DAILY   amphetamine -dextroamphetamine 10 MG 24 hr capsule Commonly known as: Adderall XR Take 1 capsule (10 mg total) by mouth daily.   anastrozole 1 MG tablet Commonly known as: ARIMIDEX Take 0.5 mg by mouth once a week.   aspirin EC 81 MG tablet Take 81 mg by mouth every other day. Swallow whole.   atorvastatin  40 MG tablet Commonly known as: LIPITOR  Take 1 tablet (40 mg total) by mouth daily.   cyanocobalamin 1000 MCG tablet Commonly known as: VITAMIN B12 Take 1,000 mcg by mouth once a week.   GLUCOSAMINE CHONDROITIN TRIPLE PO Take by mouth daily. With Tumeric   hydrocortisone cream 1 % Apply 1 application. topically as needed for itching.   Iron  (Ferrous Sulfate ) 325 (65 Fe) MG Tabs Take 1 tablet my mouth daily.   LORazepam  2 MG tablet Commonly known as: ATIVAN  TAKE 1 TABLET BY MOUTH EVERY 6 HOURS AS NEEDED   MAGNESIUM GLYCINATE PO Take by mouth.   omeprazole  20 MG capsule Commonly known as: PRILOSEC Take 20 mg by mouth daily.   ONE-A-DAY MENS PO Take 1 capsule by mouth daily.   Pregnyl 10000 units injection Generic drug: human chorionic  gonadotropin 0.4 ml ( 400 iu) Sub Q on day 3 and day 5 post testosterone  injection   sildenafil  100 MG tablet Commonly known as: VIAGRA  TAKE 1 TABLET BY MOUTH DAILY AS NEEDED FOR ERECTILE DYSFUNCTION   terbinafine  250 MG tablet Commonly known as: LAMISIL  Take 1 tablet (250 mg total) by mouth daily.   testosterone  cypionate 100 MG/ML injection Commonly known as: DEPOTESTOTERONE CYPIONATE Inject into the muscle once a week. For IM use only- Takes 0.7 ml/ weekly.   valACYclovir  500 MG tablet Commonly known as: VALTREX  TAKE 1 TABLET DAILY   VITAMIN K2-VITAMIN D3 PO Take by mouth daily.        Allergies:  Allergies  Allergen Reactions   Lisinopril  Cough    Past Medical History, Surgical history, Social history, and Family History were reviewed and updated.  Review of Systems: All other 10 point review of systems is negative.   Physical Exam:  height is 5' 10 (1.778 m). His oral temperature is 97.9 F (36.6 C). His blood pressure is 139/77 and his pulse is 76. His respiration is 18 and oxygen saturation is 98%.   Wt Readings from Last 3 Encounters:  06/07/23 194 lb 12.8 oz (88.4 kg)  05/03/23 198 lb (89.8 kg)  04/20/23 201 lb (91.2 kg)   Ocular: Sclerae unicteric, pupils equal, round and reactive to light Ear-nose-throat: Oropharynx clear, dentition fair Lymphatic: No cervical or supraclavicular adenopathy Lungs no rales or rhonchi, good excursion bilaterally Heart regular rate and rhythm, no murmur appreciated Abd soft, nontender, positive bowel sounds MSK no focal spinal tenderness, no joint edema Neuro: non-focal, well-oriented, appropriate affect  Lab Results  Component Value Date   WBC 6.9 07/01/2023   HGB 17.8 (H) 07/01/2023   HCT 52.5 (H) 07/01/2023   MCV 87.2 07/01/2023   PLT 155 07/01/2023   Lab Results  Component Value Date   FERRITIN 13 (L) 05/03/2023   IRON  94 05/03/2023   TIBC 444 05/03/2023   UIBC 350 05/03/2023   IRONPCTSAT 21 05/03/2023    Lab Results  Component Value Date   RETICCTPCT 2.3 08/24/2022   RBC 6.02 (H) 07/01/2023   No results found for: KPAFRELGTCHN, LAMBDASER, KAPLAMBRATIO No results found for: IGGSERUM, IGA, IGMSERUM No results found for: Tanner Fanny, A1GS, A2GS, Herbie Loll, MSPIKE, SPEI   Chemistry      Component Value Date/Time   NA 140 05/03/2023 1048   NA 141 09/03/2022 1404   K 3.9 05/03/2023 1048   CL 105 05/03/2023 1048   CO2 25 05/03/2023 1048   BUN 12 05/03/2023 1048   BUN 16 09/03/2022 1404   CREATININE 0.93 05/03/2023 1048   CREATININE 0.97 12/03/2021 1139  Component Value Date/Time   CALCIUM  9.2 05/03/2023 1048   ALKPHOS 82 05/03/2023 1048   AST 28 05/03/2023 1048   ALT 42 05/03/2023 1048   BILITOT 1.5 (H) 05/03/2023 1048     Encounter Diagnoses  Name Primary?   Iron  deficiency anemia due to chronic blood loss Yes   Secondary erythrocytosis    Iron  deficiency anemia, unspecified iron  deficiency anemia type    OSA (obstructive sleep apnea)     Impression and Plan: Dale [name] is a 63 yo gentleman with history of iron  deficiency anemia after agressive phlebotomizing at an outside office as well as likely malabsorption. He also has had erythrocytosis secondary to testosterone  use and untreated sleep apnea.   Today Hgb is 17.8  HCT is 52.5 He is not using his CPAP machine or night guard. Suggested a wedge pillow and nasal night strips.  He is not agreeable to testosterone  dose adjustment at this time despite advisement.  Again we discussed his testosterone  use and untreated sleep apnea. I am concerned with his risks of MI/stroke, etc. I have heavily encouraged him to wean off of his testosterone  and to use his sleep apnea oral device/consider a CPAP machine.  Hydration with water and continuation of his ASA Stroke, blood clot and MI risks and red flag signs and symptoms discussed. He will call 911 should any of these occur for  evaluation as he knows that he is at increased risk of these and death.  Phlebotomy is needed today- He declines despite advisement.    RTC 8 weeks APP, labs(CBC, CMP, iron , ferritin), +- phlebotomy   Sharla Davis, PA-C 6/12/202511:35 AM

## 2023-07-01 NOTE — Telephone Encounter (Signed)
 Copied from CRM [id number]. Topic: Clinical - Medication Refill >> Jul 01, 2023  8:56 AM Alysia Jumbo S wrote: Medication: amphetamine -dextroamphetamine (ADDERALL XR) 10 MG 24 hr capsule Request dosage increase to 20mg   Has the patient contacted their pharmacy? Yes, contact Pcp (Agent: If no, request that the patient contact the pharmacy for the refill. If patient does not wish to contact the pharmacy document the reason why and proceed with request.) (Agent: If yes, when and what did the pharmacy advise?)  This is the patient's preferred pharmacy:    [id number] - [address] [place] [address] Phone: [phone] Fax: [phone]    Is this the correct pharmacy for this prescription? Yes If no, delete pharmacy and type the correct one.   Has the prescription been filled recently? No  Is the patient out of the medication? Yes  Has the patient been seen for an appointment in the last year OR does the patient have an upcoming appointment? Yes  Can we respond through MyChart? No  Agent: Please be advised that Rx refills may take up to 3 business days. We ask that you follow-up with your pharmacy.

## 2023-07-02 MED ORDER — AMPHETAMINE-DEXTROAMPHET ER 20 MG PO CP24: 20.0000 mg | ORAL_CAPSULE | ORAL | 0 refills | Status: DC

## 2023-07-02 NOTE — Telephone Encounter (Signed)
 Done

## 2023-07-07 ENCOUNTER — Other Ambulatory Visit: Payer: Self-pay | Admitting: Family Medicine

## 2023-07-07 DIAGNOSIS — M1A0620 Idiopathic chronic gout, left knee, without tophus (tophi): Secondary | ICD-10-CM

## 2023-08-02 NOTE — Telephone Encounter (Unsigned)
 Copied from CRM [id number]. Topic: Clinical - Medication Refill >> Aug 02, 2023 10:39 AM Jasmin G wrote: Medication: amphetamine -dextroamphetamine (ADDERALL XR) 20 MG 24 hr capsule (Patient needs at 20 MG)  Has the patient contacted their pharmacy? No (Agent: If no, request that the patient contact the pharmacy for the refill. If patient does not wish to contact the pharmacy document the reason why and proceed with request.) (Agent: If yes, when and what did the pharmacy advise?)  This is the patient's preferred pharmacy:  [id number] [place] - [address] [place] [address] Phone: [phone] Fax: [phone]  Is this the correct pharmacy for this prescription? Yes If no, delete pharmacy and type the correct one.   Has the prescription been filled recently? No  Is the patient out of the medication? No  Has the patient been seen for an appointment in the last year OR does the patient have an upcoming appointment? Yes  Can we respond through MyChart? Yes  Agent: Please be advised that Rx refills may take up to 3 business days. We ask that you follow-up with your pharmacy.

## 2023-08-05 ENCOUNTER — Telehealth: Payer: Self-pay

## 2023-08-05 NOTE — Telephone Encounter (Signed)
 Copied from CRM [id number]. Topic: Clinical - Medication Question >> Aug 02, 2023 10:41 AM Jasmin G wrote: Reason for CRM: Patient stated that doctor has recommended to up his amphetamine -dextroamphetamine (ADDERALL XR) from 20 to 30 MG but that he would like to stay on 20 MG. Patient also wants to know if is necessary for him to call every month for refill, he stated that he called the pharmacy but was told to call himself, Please call him back ASAP.

## 2023-08-05 NOTE — Telephone Encounter (Signed)
 Spoke with py advised of Dr Johnny recommendation

## 2023-08-05 NOTE — Telephone Encounter (Signed)
 He already has refills at the pharmacy dated 6-13, 7-13, and 8-13. He needs to contact the pharmacy each month to get these filled. Then in late August or early Sept he needs to contact us  for another 3 months of refills

## 2023-08-06 ENCOUNTER — Other Ambulatory Visit: Payer: Self-pay | Admitting: Family Medicine

## 2023-08-26 ENCOUNTER — Inpatient Hospital Stay

## 2023-08-26 ENCOUNTER — Encounter

## 2023-08-26 ENCOUNTER — Ambulatory Visit: Admitting: Medical Oncology

## 2023-09-03 ENCOUNTER — Other Ambulatory Visit: Payer: Self-pay | Admitting: Physician Assistant

## 2023-09-05 ENCOUNTER — Other Ambulatory Visit: Payer: Self-pay | Admitting: Family Medicine

## 2023-09-30 ENCOUNTER — Other Ambulatory Visit: Payer: Self-pay | Admitting: Family Medicine

## 2023-09-30 NOTE — Telephone Encounter (Signed)
 Copied from CRM #[id number]. Topic: Clinical - Medication Refill >> Sep 30, 2023 11:30 AM Carlyon D wrote: Medication:  amphetamine -dextroamphetamine (ADDERALL XR)  30 MG 24 hr capsule   Has the patient contacted their pharmacy? No (Agent: If no, request that the patient contact the pharmacy for the refill. If patient does not wish to contact the pharmacy document the reason why and proceed with request.) (Agent: If yes, when and what did the pharmacy advise?)   [id number] [place] [place] - [address] [place] [address] Phone: [phone] Fax: [phone]   Is this the correct pharmacy for this prescription? Yes If no, delete pharmacy and type the correct one.   Has the prescription been filled recently? Yes  Is the patient out of the medication? No, has 7 pills left   Has the patient been seen for an appointment in the last year OR does the patient have an upcoming appointment? Yes  Can we respond through MyChart? Yes  Agent: Please be advised that Rx refills may take up to 3 business days. We ask that you follow-up with your pharmacy.

## 2023-10-04 ENCOUNTER — Telehealth: Payer: Self-pay | Admitting: *Deleted

## 2023-10-04 NOTE — Telephone Encounter (Signed)
 Copied from CRM [id number]. Topic: Clinical - Medication Question >> Oct 04, 2023  3:45 PM Mesmerise C wrote: Reason for CRM: Patient called in on 9/11 for a refill of his amphetamine -dextroamphetamine (ADDERALL XR) 20 MG 24 hr capsule and stated he spoke to his provider and was told could get an increase in dosage to 30mg  a refill request was sent for 30mg  and showing pending for 20mg  and states he's going out of tomorrow has about 3-4 left

## 2023-10-05 ENCOUNTER — Other Ambulatory Visit: Payer: Self-pay | Admitting: Family Medicine

## 2023-10-05 ENCOUNTER — Telehealth: Payer: Self-pay | Admitting: *Deleted

## 2023-10-05 DIAGNOSIS — M1A0620 Idiopathic chronic gout, left knee, without tophus (tophi): Secondary | ICD-10-CM

## 2023-10-05 MED ORDER — AMPHETAMINE-DEXTROAMPHET ER 20 MG PO CP24: 20.0000 mg | ORAL_CAPSULE | ORAL | 0 refills | Status: DC

## 2023-10-05 NOTE — Telephone Encounter (Signed)
 Copied from CRM [id number]. Topic: Clinical - Prescription Issue >> Oct 05, 2023 12:03 PM Dale [name] wrote: Reason for CRM: Patient called in stating that he spoke with provider about increasing his Adderall from 20 mg to 30 at patients request .Provider agreed.Refill request was for 30 mg ,however provider sent in 20mg  refill.Patient would like to have new script for 30 mg sent to pharmacy please.  [id number] [place] [place] - [address]  Phone: [phone] Fax: [phone]

## 2023-10-05 NOTE — Telephone Encounter (Signed)
 Done

## 2023-10-06 MED ORDER — AMPHETAMINE-DEXTROAMPHET ER 30 MG PO CP24: 30.0000 mg | ORAL_CAPSULE | ORAL | 0 refills | Status: DC

## 2023-10-06 NOTE — Telephone Encounter (Signed)
 Done

## 2023-10-06 NOTE — Telephone Encounter (Signed)
 Message sent to PCP.

## 2023-10-08 ENCOUNTER — Other Ambulatory Visit: Payer: Self-pay | Admitting: Family Medicine

## 2023-10-08 NOTE — Telephone Encounter (Signed)
 Copied from CRM [id number]. Topic: Clinical - Medication Refill >> Oct 08, 2023 12:53 PM Suzen RAMAN wrote: Medication: amphetamine -dextroamphetamine (ADDERALL XR) 30 MG 24 hr capsule   Has the patient contacted their pharmacy? Yes  This is the patient's preferred pharmacy:    [id number] [place] [place] - [address] [place] [address] Phone: [phone] Fax: [phone]   Is this the correct pharmacy for this prescription? Yes If no, delete pharmacy and type the correct one.   Has the prescription been filled recently? Yes; but sent to the incorrect pharmacy per patient.  Is the patient out of the medication? Yes  Has the patient been seen for an appointment in the last year OR does the patient have an upcoming appointment? Yes  Can we respond through MyChart? Yes  Agent: Please be advised that Rx refills may take up to 3 business days. We ask that you follow-up with your pharmacy.

## 2023-10-11 MED ORDER — AMPHETAMINE-DEXTROAMPHET ER 30 MG PO CP24: 30.0000 mg | ORAL_CAPSULE | ORAL | 0 refills | Status: DC

## 2023-10-11 NOTE — Telephone Encounter (Signed)
 Already done

## 2023-10-11 NOTE — Telephone Encounter (Signed)
 Done

## 2023-10-11 NOTE — Addendum Note (Signed)
 Addended by: JOHNNY SENIOR A on: 10/11/2023 07:59 AM   Modules accepted: Orders

## 2023-12-20 ENCOUNTER — Other Ambulatory Visit: Payer: Self-pay | Admitting: *Deleted

## 2023-12-21 ENCOUNTER — Other Ambulatory Visit: Payer: Self-pay | Admitting: Family Medicine

## 2023-12-22 NOTE — Telephone Encounter (Signed)
 Pt LOV was 06/07/23 Last refill was done on 03/18/22 Please advise

## 2023-12-28 ENCOUNTER — Encounter: Payer: Self-pay | Admitting: Family Medicine

## 2023-12-28 ENCOUNTER — Ambulatory Visit: Admitting: Family Medicine

## 2023-12-28 VITALS — BP 130/74 | HR 103 | Temp 98.4°F | Resp 18 | Wt 191.8 lb

## 2023-12-28 DIAGNOSIS — Z01818 Encounter for other preprocedural examination: Secondary | ICD-10-CM

## 2023-12-28 LAB — POCT GLYCOSYLATED HEMOGLOBIN (HGB A1C)
HbA1c, POC (controlled diabetic range): 5.1 % (ref 0.0–7.0)
Hemoglobin A1C: 5.1 % (ref 4.0–5.6)

## 2023-12-28 MED ORDER — AMPHETAMINE-DEXTROAMPHET ER 20 MG PO CP24: 20.0000 mg | ORAL_CAPSULE | ORAL | 0 refills | Status: AC

## 2023-12-28 MED ORDER — CEFTRIAXONE SODIUM 1 G IJ SOLR: 1.0000 g | Freq: Once | INTRAMUSCULAR | Status: DC

## 2023-12-28 NOTE — Progress Notes (Signed)
   Subjective:    Patient ID: Dale [name], male    DOB: 04-May-1960, 63 y.o.   MRN: [id number]  HPI Here for clearance prior to a left total knee arthroplasty per Dr. Melodi scheduled for 01-18-24. Other than the knee pain, he feels fine.    Review of Systems  Constitutional: Negative.   Respiratory: Negative.    Cardiovascular: Negative.   Gastrointestinal: Negative.   Genitourinary: Negative.   Musculoskeletal:  Positive for arthralgias.  Neurological: Negative.        Objective:   Physical Exam Constitutional:      Appearance: Normal appearance.  Cardiovascular:     Rate and Rhythm: Normal rate and regular rhythm.     Pulses: Normal pulses.     Heart sounds: Normal heart sounds.  Pulmonary:     Effort: Pulmonary effort is normal.     Breath sounds: Normal breath sounds.  Neurological:     General: No focal deficit present.     Mental Status: He is alert and oriented to person, place, and time.           Assessment & Plan:  He is doing well overall. He is cleared for the knee replacement surgery.  Garnette Olmsted, MD

## 2023-12-30 ENCOUNTER — Other Ambulatory Visit: Payer: Self-pay | Admitting: Family Medicine

## 2023-12-30 NOTE — Telephone Encounter (Signed)
 Called patient and he states that he has some of the medication left for possibly a week or so He states he was told the pharmacy didn't have the medication  Patient is advised to call us  back if anything changes  He wants the prescription to [facility] to[facility] [address] [place], [place]   Patient is advised to call us  back if anything changes or with any further questions/concerns. Patient is advised that if anything worsens to go to the Emergency Room. Patient verbalized understanding.                     Copied from CRM #[id number]. Topic: Clinical - Prescription Issue >> Dec 30, 2023 10:39 AM Ashley R wrote: Reason for CRM:  LORazepam  2 mg Oral Every 6 hours:  E-Prescribing Status: Transmission to pharmacy failed  12/30/23 10:02 AM Interface, Surescripts Out routed this conversation

## 2024-01-03 ENCOUNTER — Other Ambulatory Visit: Payer: Self-pay | Admitting: Cardiology

## 2024-01-12 ENCOUNTER — Telehealth: Payer: Self-pay

## 2024-01-12 ENCOUNTER — Telehealth: Payer: Self-pay | Admitting: *Deleted

## 2024-01-12 MED ORDER — LORAZEPAM 2 MG PO TABS: 2.0000 mg | ORAL_TABLET | Freq: Four times a day (QID) | ORAL | 0 refills | Status: DC | PRN

## 2024-01-12 NOTE — Telephone Encounter (Signed)
 Pt request was sent to Dr Micheal for approval, pt PCP is not available

## 2024-01-12 NOTE — Telephone Encounter (Signed)
 FYI Spoke with pt advised that PCP is out today but Dr Micheal sent 30 tablets to his pharmacy until Dr Johnny returns to the office on 01/18/24 , pt voiced understanding

## 2024-01-12 NOTE — Telephone Encounter (Signed)
 Copied from CRM #[id number]. Topic: Clinical - Prescription Issue >> Dec 30, 2023 10:39 AM Ashley R wrote: Reason for CRM:  LORazepam  2 mg Oral Every 6 hours:  E-Prescribing Status: Transmission to pharmacy failed  12/30/23 10:02 AM Interface, Surescripts Out routed this conversation >> Jan 12, 2024  9:18 AM Rosina D wrote: Patient wanting an update on his medication refill. I told the patient that [facility] told me they will send it again today because they do see where it failed while going through. [id number] - [place], [place] - [address]

## 2024-01-12 NOTE — Addendum Note (Signed)
 Addended by: MICHEAL WOLM ORN on: 01/12/2024 12:08 PM   Modules accepted: Orders

## 2024-01-12 NOTE — Telephone Encounter (Signed)
 I sent in enough Lorazepam  until primary gets back  Wolm LELON Scarlet MD [facility]

## 2024-01-17 ENCOUNTER — Encounter: Payer: Self-pay | Admitting: *Deleted

## 2024-01-17 NOTE — Telephone Encounter (Signed)
 Noted

## 2024-01-18 MED ORDER — LORAZEPAM 2 MG PO TABS: 2.0000 mg | ORAL_TABLET | Freq: Three times a day (TID) | ORAL | 5 refills | Status: AC | PRN

## 2024-01-18 NOTE — Telephone Encounter (Signed)
I sent in a 6 month supply  

## 2024-02-02 ENCOUNTER — Other Ambulatory Visit: Payer: Self-pay | Admitting: Family Medicine

## 2024-02-08 ENCOUNTER — Other Ambulatory Visit: Payer: Self-pay | Admitting: Cardiovascular Disease
